# Patient Record
Sex: Male | Born: 1950 | Race: Black or African American | Hispanic: No | Marital: Married | Smoking: Never smoker
Health system: Southern US, Community
[De-identification: ages and names within clinical notes are randomized; demographics above are authoritative.]

## PROBLEM LIST (undated history)

## (undated) DIAGNOSIS — E119 Type 2 diabetes mellitus without complications: Secondary | ICD-10-CM

## (undated) DIAGNOSIS — E785 Hyperlipidemia, unspecified: Secondary | ICD-10-CM

## (undated) DIAGNOSIS — R000 Tachycardia, unspecified: Secondary | ICD-10-CM

## (undated) DIAGNOSIS — I10 Essential (primary) hypertension: Secondary | ICD-10-CM

## (undated) HISTORY — DX: Tachycardia, unspecified: R00.0

## (undated) HISTORY — DX: Essential (primary) hypertension: I10

## (undated) HISTORY — PX: OTHER SURGICAL HISTORY: SHX169

## (undated) HISTORY — DX: Hyperlipidemia, unspecified: E78.5

## (undated) HISTORY — PX: MULTIPLE TOOTH EXTRACTIONS: SHX2053

## (undated) HISTORY — DX: Type 2 diabetes mellitus without complications: E11.9

---

## 2000-06-03 ENCOUNTER — Encounter: Admission: RE | Admit: 2000-06-03 | Discharge: 2000-09-01 | Payer: Self-pay | Admitting: Family Medicine

## 2004-05-12 ENCOUNTER — Ambulatory Visit: Payer: Self-pay | Admitting: Family Medicine

## 2004-10-02 ENCOUNTER — Ambulatory Visit: Payer: Self-pay | Admitting: Family Medicine

## 2006-08-09 ENCOUNTER — Ambulatory Visit: Payer: Self-pay | Admitting: Family Medicine

## 2006-08-09 LAB — CONVERTED CEMR LAB
Albumin: 3.8 g/dL (ref 3.5–5.2)
Alkaline Phosphatase: 56 units/L (ref 39–117)
BUN: 15 mg/dL (ref 6–23)
Basophils Absolute: 0 10*3/uL (ref 0.0–0.1)
Cholesterol: 200 mg/dL (ref 0–200)
Creatinine, Ser: 1.4 mg/dL (ref 0.4–1.5)
Creatinine,U: 174 mg/dL
GFR calc Af Amer: 67 mL/min
HCT: 39.1 % (ref 39.0–52.0)
HDL: 33.7 mg/dL — ABNORMAL LOW (ref >39.0)
Hemoglobin: 13.5 g/dL (ref 13.0–17.0)
Lymphocytes Relative: 36.9 % (ref 12.0–46.0)
MCHC: 34.4 g/dL (ref 30.0–36.0)
MCV: 94.4 fL (ref 78.0–100.0)
Microalb Creat Ratio: 1.7 mg/g (ref 0.0–30.0)
Microalb, Ur: 0.3 mg/dL (ref 0.0–1.9)
Monocytes Absolute: 0.5 10*3/uL (ref 0.2–0.7)
Monocytes Relative: 10.5 % (ref 3.0–11.0)
Neutro Abs: 2.2 10*3/uL (ref 1.4–7.7)
Neutrophils Relative %: 50.1 % (ref 43.0–77.0)
PSA: 0.4 ng/mL (ref 0.10–4.00)
Potassium: 4.5 meq/L (ref 3.5–5.1)
Sodium: 139 meq/L (ref 135–145)
TSH: 0.94 microintl units/mL (ref 0.35–5.50)
Total Bilirubin: 0.8 mg/dL (ref 0.3–1.2)
Total Protein: 7.6 g/dL (ref 6.0–8.3)

## 2006-11-25 DIAGNOSIS — I10 Essential (primary) hypertension: Secondary | ICD-10-CM | POA: Insufficient documentation

## 2006-11-25 DIAGNOSIS — E119 Type 2 diabetes mellitus without complications: Secondary | ICD-10-CM | POA: Insufficient documentation

## 2007-04-07 ENCOUNTER — Telehealth: Payer: Self-pay | Admitting: Family Medicine

## 2007-05-06 ENCOUNTER — Ambulatory Visit: Payer: Self-pay | Admitting: Family Medicine

## 2007-05-06 DIAGNOSIS — R000 Tachycardia, unspecified: Secondary | ICD-10-CM

## 2007-05-06 HISTORY — DX: Tachycardia, unspecified: R00.0

## 2007-05-06 LAB — CONVERTED CEMR LAB
Chloride: 103 meq/L (ref 96–112)
Creatinine, Ser: 1.4 mg/dL (ref 0.4–1.5)
Glucose, Bld: 109 mg/dL — ABNORMAL HIGH (ref 70–99)
Hgb A1c MFr Bld: 8.8 % — ABNORMAL HIGH (ref 4.6–6.0)
Potassium: 4.8 meq/L (ref 3.5–5.1)
Sodium: 141 meq/L (ref 135–145)
TSH: 0.89 microintl units/mL (ref 0.35–5.50)

## 2007-05-12 ENCOUNTER — Telehealth: Payer: Self-pay | Admitting: Family Medicine

## 2007-06-03 ENCOUNTER — Telehealth (INDEPENDENT_AMBULATORY_CARE_PROVIDER_SITE_OTHER): Payer: Self-pay | Admitting: *Deleted

## 2007-07-18 ENCOUNTER — Telehealth: Payer: Self-pay | Admitting: Family Medicine

## 2007-08-18 ENCOUNTER — Telehealth: Payer: Self-pay | Admitting: Family Medicine

## 2007-08-29 ENCOUNTER — Telehealth (INDEPENDENT_AMBULATORY_CARE_PROVIDER_SITE_OTHER): Payer: Self-pay | Admitting: *Deleted

## 2008-01-02 ENCOUNTER — Ambulatory Visit: Payer: Self-pay | Admitting: Family Medicine

## 2008-01-02 ENCOUNTER — Telehealth: Payer: Self-pay | Admitting: Family Medicine

## 2008-03-01 ENCOUNTER — Ambulatory Visit: Payer: Self-pay | Admitting: Family Medicine

## 2008-03-02 ENCOUNTER — Telehealth: Payer: Self-pay | Admitting: *Deleted

## 2008-03-02 ENCOUNTER — Telehealth: Payer: Self-pay | Admitting: Family Medicine

## 2008-03-08 ENCOUNTER — Telehealth: Payer: Self-pay | Admitting: Family Medicine

## 2008-05-28 ENCOUNTER — Telehealth: Payer: Self-pay | Admitting: Family Medicine

## 2008-07-30 ENCOUNTER — Telehealth: Payer: Self-pay | Admitting: *Deleted

## 2009-05-16 ENCOUNTER — Telehealth: Payer: Self-pay | Admitting: Family Medicine

## 2009-06-17 ENCOUNTER — Telehealth: Payer: Self-pay | Admitting: Family Medicine

## 2009-06-21 ENCOUNTER — Ambulatory Visit: Payer: Self-pay | Admitting: Family Medicine

## 2009-06-21 LAB — CONVERTED CEMR LAB
AST: 18 units/L (ref 0–37)
Albumin: 3.9 g/dL (ref 3.5–5.2)
Alkaline Phosphatase: 48 units/L (ref 39–117)
Basophils Absolute: 0 10*3/uL (ref 0.0–0.1)
Basophils Relative: 1.1 % (ref 0.0–3.0)
Blood in Urine, dipstick: NEGATIVE
Calcium: 9.5 mg/dL (ref 8.4–10.5)
GFR calc non Af Amer: 53.07 mL/min (ref >60)
HDL: 40.7 mg/dL (ref >39.00)
Hemoglobin: 12.6 g/dL — ABNORMAL LOW (ref 13.0–17.0)
Lymphocytes Relative: 35.5 % (ref 12.0–46.0)
Microalb, Ur: 0.3 mg/dL (ref 0.0–1.9)
Monocytes Relative: 10.7 % (ref 3.0–12.0)
Neutro Abs: 2.1 10*3/uL (ref 1.4–7.7)
Nitrite: NEGATIVE
RBC: 3.87 M/uL — ABNORMAL LOW (ref 4.22–5.81)
Sodium: 144 meq/L (ref 135–145)
Total CHOL/HDL Ratio: 5
VLDL: 22.8 mg/dL (ref 0.0–40.0)
WBC Urine, dipstick: NEGATIVE
WBC: 4.3 10*3/uL — ABNORMAL LOW (ref 4.5–10.5)

## 2009-07-14 ENCOUNTER — Ambulatory Visit: Payer: Self-pay | Admitting: Family Medicine

## 2009-07-14 DIAGNOSIS — D638 Anemia in other chronic diseases classified elsewhere: Secondary | ICD-10-CM | POA: Insufficient documentation

## 2009-09-14 ENCOUNTER — Telehealth: Payer: Self-pay | Admitting: Family Medicine

## 2010-01-10 ENCOUNTER — Telehealth: Payer: Self-pay | Admitting: Family Medicine

## 2010-04-02 LAB — CONVERTED CEMR LAB
Albumin: 3.8 g/dL (ref 3.5–5.2)
Alkaline Phosphatase: 53 units/L (ref 39–117)
BUN: 22 mg/dL (ref 6–23)
Basophils Absolute: 0.1 10*3/uL (ref 0.0–0.1)
Bilirubin Urine: NEGATIVE
Blood in Urine, dipstick: NEGATIVE
Cholesterol: 202 mg/dL (ref 0–200)
Creatinine,U: 216.1 mg/dL
Direct LDL: 123.6 mg/dL
Eosinophils Absolute: 0.1 10*3/uL (ref 0.0–0.7)
Eosinophils Relative: 2.6 % (ref 0.0–5.0)
GFR calc Af Amer: 62 mL/min
GFR calc non Af Amer: 51 mL/min
HCT: 39.7 % (ref 39.0–52.0)
HDL: 37.1 mg/dL — ABNORMAL LOW (ref >39.0)
Hgb A1c MFr Bld: 8.4 % — ABNORMAL HIGH (ref 4.6–6.0)
Ketones, urine, test strip: NEGATIVE
MCHC: 33.9 g/dL (ref 30.0–36.0)
MCV: 96.6 fL (ref 78.0–100.0)
Microalb Creat Ratio: 2.3 mg/g (ref 0.0–30.0)
Monocytes Absolute: 0.4 10*3/uL (ref 0.1–1.0)
Neutrophils Relative %: 49 % (ref 43.0–77.0)
Platelets: 205 10*3/uL (ref 150–400)
Potassium: 4.9 meq/L (ref 3.5–5.1)
RDW: 12.9 % (ref 11.5–14.6)
Specific Gravity, Urine: 1.02
TSH: 0.7 microintl units/mL (ref 0.35–5.50)
Total Bilirubin: 0.8 mg/dL (ref 0.3–1.2)
Triglycerides: 133 mg/dL (ref 0–149)
WBC: 4.2 10*3/uL — ABNORMAL LOW (ref 4.5–10.5)
pH: 7
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
## 2010-04-04 NOTE — Progress Notes (Signed)
Summary: ramipril  Phone Note Refill Request Message from:  Fax from Pharmacy on January 10, 2010 5:14 PM  Refills Requested: Medication #1:  RAMIPRIL 10 MG CAPS Take two every morning--NEEDS OFFICE VISIT FOR ADDITIONAL REFILLS Initial call taken by: Kern Reap CMA Duncan Dull),  January 10, 2010 5:14 PM    Prescriptions: RAMIPRIL 10 MG CAPS (RAMIPRIL) Take two every morning--NEEDS OFFICE VISIT FOR ADDITIONAL REFILLS  #100 x 3   Entered by:   Kern Reap CMA (AAMA)   Authorized by:   Roderick Pee MD   Signed by:   Kern Reap CMA (AAMA) on 01/10/2010   Method used:   Electronically to        [facility] (retail)       [address]       [place], [place]  [address]       Ph: [phone] or [phone]       Fax: [phone]   RxID:   [id number]

## 2010-04-04 NOTE — Progress Notes (Signed)
Summary: refills  Phone Note Call from Patient Call back at Home Phone [phone] Call back at [phone]    Summary of Call: Has appointment 24th this month & needs refills meds to last until appt.  [facility].  Initial call taken by: Rudy Jew, RN,  May 16, 2009 8:31 AM    Prescriptions: GLUCOPHAGE 500 MG TABS (METFORMIN HCL) Take 1 tablet by mouth every morning  #30 x 0   Entered by:   Kern Reap CMA (AAMA)   Authorized by:   Roderick Pee MD   Signed by:   Kern Reap CMA (AAMA) on 05/16/2009   Method used:   Electronically to        [facility] (retail)       [address]       [place], [place]  [address]       Ph: [phone] or [phone]       Fax: [phone]   RxID:   [id number] RAMIPRIL 10 MG CAPS (RAMIPRIL) Take two every morning--NEEDS OFFICE VISIT FOR ADDITIONAL REFILLS  #60 x 0   Entered by:   Kern Reap CMA (AAMA)   Authorized by:   Roderick Pee MD   Signed by:   Kern Reap CMA (AAMA) on 05/16/2009   Method used:   Electronically to        [facility] (retail)       [address]       [place], [place]  [address]       Ph: [phone] or [phone]       Fax: [phone]   RxID:   [id number] TOPROL XL 100 MG XR24H-TAB (METOPROLOL SUCCINATE) Take 1 tablet by mouth every morning  #30 x 0   Entered by:   Kern Reap CMA (AAMA)   Authorized by:   Roderick Pee MD   Signed by:   Kern Reap CMA (AAMA) on 05/16/2009   Method used:   Electronically to        [facility] (retail)       [address]       [place], [place]  [address]       Ph: [phone] or [phone]       Fax: [phone]   RxID:   [id number] VIAGRA 50 MG  TABS (SILDENAFIL CITRATE) UAD  #6 x 0   Entered by:   Kern Reap CMA (AAMA)   Authorized by:   Roderick Pee MD   Signed by:   Kern Reap CMA (AAMA) on  05/16/2009   Method used:   Electronically to        [facility] (retail)       [address]       [place], [place]  [address]       Ph: [phone] or [phone]       Fax: [phone]   RxID:   [id number] GLIPIZIDE 10 MG  TABS (GLIPIZIDE) Take 1 tablet by mouth every 12 hours  #60 x 0   Entered by:   Kern Reap CMA (AAMA)   Authorized by:   Roderick Pee MD   Signed by:   Fleet Contras  Vereen CMA (AAMA) on 05/16/2009   Method used:   Electronically to        [facility] (retail)       [address]       [place], [place]  [address]       Ph: [phone] or [phone]       Fax: [phone]   RxID:   [id number] HYDROCHLOROTHIAZIDE 25 MG TABS (HYDROCHLOROTHIAZIDE) 1 tablet by mouth every morning  #30 x 0   Entered by:   Kern Reap CMA (AAMA)   Authorized by:   Roderick Pee MD   Signed by:   Kern Reap CMA (AAMA) on 05/16/2009   Method used:   Electronically to        [facility] (retail)       [address]       [place], [place]  [address]       Ph: [phone] or [phone]       Fax: [phone]   RxID:   [id number]

## 2010-04-04 NOTE — Progress Notes (Signed)
Summary: refill  Phone Note Call from Patient Call back at [phone]   Caller: Patient----live call Summary of Call: please recently all meds that were recently denied this week. he has a cpx on 06-30-2008. Initial call taken by: Warnell Forester,  June 17, 2009 1:39 PM  Follow-up for Phone Call        last office visit was 12/09 is this okay to fill? Follow-up by: Kern Reap CMA Duncan Dull),  June 17, 2009 2:04 PM  Additional Follow-up for Phone Call Additional follow up Details #1::        patient has not been here since 2009.  He makes appointments gets his medicine and then cancels the appointments Fleet Contras is advised him we will give him 30 days of medication, but absolutely no more refills Additional Follow-up by: Roderick Pee MD,  June 17, 2009 4:55 PM    Prescriptions: GLUCOPHAGE 500 MG TABS (METFORMIN HCL) Take 1 tablet by mouth every morning  #30 x 0   Entered by:   Kern Reap CMA (AAMA)   Authorized by:   Roderick Pee MD   Signed by:   Kern Reap CMA (AAMA) on 06/17/2009   Method used:   Electronically to        [facility] (retail)       [address]       [place], [place]  [address]       Ph: [phone] or [phone]       Fax: [phone]   RxID:   [id number] RAMIPRIL 10 MG CAPS (RAMIPRIL) Take two every morning--NEEDS OFFICE VISIT FOR ADDITIONAL REFILLS  #60 x 0   Entered by:   Kern Reap CMA (AAMA)   Authorized by:   Roderick Pee MD   Signed by:   Kern Reap CMA (AAMA) on 06/17/2009   Method used:   Electronically to        [facility] (retail)       [address]       [place], [place]  [address]       Ph: [phone] or [phone]       Fax: [phone]   RxID:   [id number] TOPROL XL 100 MG XR24H-TAB (METOPROLOL SUCCINATE) Take 1 tablet by mouth every morning  #30 x 0   Entered by:   Kern Reap CMA (AAMA)   Authorized by:   Roderick Pee MD  Signed by:   Kern Reap CMA (AAMA) on 06/17/2009   Method used:   Electronically to        [facility] (retail)       [address]       [place], [place]  [address]       Ph: [phone] or [phone]       Fax: [phone]   RxID:   [id number] GLIPIZIDE 10 MG  TABS (GLIPIZIDE) Take 1 tablet by mouth every 12 hours  #60 x 0   Entered by:   Kern Reap CMA (AAMA)   Authorized by:   Roderick Pee MD   Signed by:   Kern Reap CMA (AAMA) on 06/17/2009   Method used:   Electronically to        [facility] (retail)  [address]       [place], [place]  [address]       Ph: [phone] or [phone]       Fax: [phone]   RxID:   [id number] HYDROCHLOROTHIAZIDE 25 MG TABS (HYDROCHLOROTHIAZIDE) 1 tablet by mouth every morning  #30 x 0   Entered by:   Kern Reap CMA (AAMA)   Authorized by:   Roderick Pee MD   Signed by:   Kern Reap CMA (AAMA) on 06/17/2009   Method used:   Electronically to        [facility] (retail)       [address]       [place], [place]  [address]       Ph: [phone] or [phone]       Fax: [phone]   RxID:   [id number]

## 2010-04-04 NOTE — Progress Notes (Signed)
Summary: URI  Phone Note Call from Patient   Caller: Patient Call For: Roderick Pee MD Summary of Call: Pt is asking for RX for a URI.  Symptoms are coughing and sneezing with runny nose.  No fever.  [facility] [phone] Initial call taken by: Lynann Beaver CMA,  September 14, 2009 9:14 AM  Follow-up for Phone Call        symptoms consistent with allergic rhinitis......... Claritin plain, 10 mg in the morning or Zyrtec plane 10 mg at bedtime.  Both OTC products Follow-up by: Roderick Pee MD,  September 14, 2009 9:19 AM    New/Updated Medications: CLARITIN 10 MG TABS (LORATADINE) one by mouth q am ZYRTEC ALLERGY 10 MG CAPS (CETIRIZINE HCL) one by mouth q hs Prescriptions: ZYRTEC ALLERGY 10 MG CAPS (CETIRIZINE HCL) one by mouth q hs  #30 x prn   Entered by:   Lynann Beaver CMA   Authorized by:   Roderick Pee MD   Signed by:   Lynann Beaver CMA on 09/14/2009   Method used:   Electronically to        [facility] (retail)       [address]       [place], [place]  [address]       Ph: [phone] or [phone]       Fax: [phone]   RxID:   [id number] CLARITIN 10 MG TABS (LORATADINE) one by mouth q am  #30 x prn   Entered by:   Lynann Beaver CMA   Authorized by:   Roderick Pee MD   Signed by:   Lynann Beaver CMA on 09/14/2009   Method used:   Electronically to        [facility] [address] (retail)       [address]       [place], [place]  [address]       Ph: [phone] or [phone]       Fax: [phone]   RxID:   [id number]  Notified pt on personal voice mail per pt request.

## 2010-07-21 NOTE — Assessment & Plan Note (Signed)
[facility]                                 ON-CALL NOTE   Mark Ali, Mark Ali                       MRN:          [id number]  DATE:06/13/2007                            DOB:          [birth year]    Phone number [phone].  Primary physician, Dr. Tawanna Cooler.   SUBJECTIVE:  The patient has been told to increase his glipizide to  twice a day, but his prescription does not reflect this and he has run  out of it early.  He is requesting this be sent into his pharmacy.   ASSESSMENT/PLAN:  Prescription sent to [facility] [address].  Glipizide 10 mg 1 tablet p.o. b.i.d., #60, no refills.     Kerby Nora, MD  Electronically Signed    AB/MedQ  DD: 06/13/2007  DT: 06/13/2007  Job #: [id number]

## 2010-07-30 ENCOUNTER — Other Ambulatory Visit: Payer: Self-pay | Admitting: Family Medicine

## 2010-08-01 ENCOUNTER — Other Ambulatory Visit: Payer: Self-pay | Admitting: Family Medicine

## 2010-08-01 NOTE — Telephone Encounter (Signed)
Pt needs all medications refilled, appt for cpx scheduled for 10/10/10, needs enough meds until appt in August.

## 2010-08-02 NOTE — Telephone Encounter (Signed)
Pt would like 3 refills of each med. Pt is sch for cpx in aug 2012

## 2010-08-09 ENCOUNTER — Other Ambulatory Visit: Payer: Self-pay | Admitting: Family Medicine

## 2010-09-08 ENCOUNTER — Other Ambulatory Visit: Payer: Self-pay | Admitting: *Deleted

## 2010-09-08 MED ORDER — METFORMIN HCL 500 MG PO TABS: 500.0000 mg | ORAL_TABLET | Freq: Two times a day (BID) | ORAL | Status: DC

## 2010-10-03 ENCOUNTER — Other Ambulatory Visit: Payer: Self-pay

## 2010-10-10 ENCOUNTER — Encounter: Payer: Self-pay | Admitting: Family Medicine

## 2010-10-12 ENCOUNTER — Other Ambulatory Visit: Payer: Self-pay

## 2010-10-26 ENCOUNTER — Other Ambulatory Visit: Payer: Self-pay

## 2010-11-02 ENCOUNTER — Encounter: Payer: Self-pay | Admitting: Family Medicine

## 2010-11-21 ENCOUNTER — Other Ambulatory Visit: Payer: Self-pay

## 2010-11-28 ENCOUNTER — Encounter: Payer: Self-pay | Admitting: Family Medicine

## 2010-12-24 ENCOUNTER — Other Ambulatory Visit: Payer: Self-pay | Admitting: Family Medicine

## 2010-12-26 ENCOUNTER — Other Ambulatory Visit: Payer: Self-pay | Admitting: Family Medicine

## 2011-01-29 ENCOUNTER — Other Ambulatory Visit: Payer: Self-pay | Admitting: Family Medicine

## 2011-02-14 ENCOUNTER — Other Ambulatory Visit: Payer: Self-pay | Admitting: Family Medicine

## 2011-02-20 ENCOUNTER — Other Ambulatory Visit (INDEPENDENT_AMBULATORY_CARE_PROVIDER_SITE_OTHER): Payer: BC Managed Care – PPO

## 2011-02-20 DIAGNOSIS — Z0000 Encounter for general adult medical examination without abnormal findings: Secondary | ICD-10-CM

## 2011-02-20 LAB — LIPID PANEL
Cholesterol: 183 mg/dL (ref 0–200)
LDL Cholesterol: 120 mg/dL — ABNORMAL HIGH (ref 0–99)
Total CHOL/HDL Ratio: 5
VLDL: 26.4 mg/dL (ref 0.0–40.0)

## 2011-02-20 LAB — CBC WITH DIFFERENTIAL/PLATELET
Basophils Absolute: 0.1 10*3/uL (ref 0.0–0.1)
Hemoglobin: 12.9 g/dL — ABNORMAL LOW (ref 13.0–17.0)
Lymphocytes Relative: 31.7 % (ref 12.0–46.0)
Monocytes Relative: 9.7 % (ref 3.0–12.0)
Neutro Abs: 2.4 10*3/uL (ref 1.4–7.7)
RDW: 14 % (ref 11.5–14.6)
WBC: 4.4 10*3/uL — ABNORMAL LOW (ref 4.5–10.5)

## 2011-02-20 LAB — HEPATIC FUNCTION PANEL
ALT: 15 U/L (ref 0–53)
AST: 19 U/L (ref 0–37)
Alkaline Phosphatase: 65 U/L (ref 39–117)
Bilirubin, Direct: 0.1 mg/dL (ref 0.0–0.3)
Total Bilirubin: 0.5 mg/dL (ref 0.3–1.2)
Total Protein: 7.1 g/dL (ref 6.0–8.3)

## 2011-02-20 LAB — BASIC METABOLIC PANEL
Calcium: 9.7 mg/dL (ref 8.4–10.5)
GFR: 57.85 mL/min — ABNORMAL LOW (ref >60.00)
Glucose, Bld: 159 mg/dL — ABNORMAL HIGH (ref 70–99)
Sodium: 143 mEq/L (ref 135–145)

## 2011-02-20 LAB — TSH: TSH: 1.11 u[IU]/mL (ref 0.35–5.50)

## 2011-02-20 LAB — POCT URINALYSIS DIPSTICK
Blood, UA: NEGATIVE
Glucose, UA: NEGATIVE
Spec Grav, UA: >=1.03
Urobilinogen, UA: 0.2
pH, UA: 6

## 2011-02-21 LAB — MICROALBUMIN / CREATININE URINE RATIO: Creatinine,U: 316.8 mg/dL

## 2011-02-23 ENCOUNTER — Other Ambulatory Visit: Payer: Self-pay | Admitting: Family Medicine

## 2011-03-02 ENCOUNTER — Encounter: Payer: Self-pay | Admitting: Family Medicine

## 2011-03-05 ENCOUNTER — Encounter: Payer: Self-pay | Admitting: Family Medicine

## 2011-03-05 ENCOUNTER — Telehealth: Payer: Self-pay | Admitting: Family Medicine

## 2011-03-05 ENCOUNTER — Ambulatory Visit (INDEPENDENT_AMBULATORY_CARE_PROVIDER_SITE_OTHER): Payer: BC Managed Care – PPO | Admitting: Family Medicine

## 2011-03-05 DIAGNOSIS — D638 Anemia in other chronic diseases classified elsewhere: Secondary | ICD-10-CM

## 2011-03-05 DIAGNOSIS — I10 Essential (primary) hypertension: Secondary | ICD-10-CM

## 2011-03-05 DIAGNOSIS — Z23 Encounter for immunization: Secondary | ICD-10-CM

## 2011-03-05 DIAGNOSIS — R000 Tachycardia, unspecified: Secondary | ICD-10-CM

## 2011-03-05 DIAGNOSIS — E119 Type 2 diabetes mellitus without complications: Secondary | ICD-10-CM

## 2011-03-05 MED ORDER — RAMIPRIL 10 MG PO CAPS: ORAL_CAPSULE | ORAL | Status: DC

## 2011-03-05 MED ORDER — GLIPIZIDE 10 MG PO TABS: 10.0000 mg | ORAL_TABLET | Freq: Two times a day (BID) | ORAL | Status: DC

## 2011-03-05 MED ORDER — SIMVASTATIN 10 MG PO TABS: 10.0000 mg | ORAL_TABLET | Freq: Every day | ORAL | Status: DC

## 2011-03-05 MED ORDER — HYDROCHLOROTHIAZIDE 25 MG PO TABS: 25.0000 mg | ORAL_TABLET | Freq: Every day | ORAL | Status: DC

## 2011-03-05 MED ORDER — METFORMIN HCL 500 MG PO TABS
500.0000 mg | ORAL_TABLET | Freq: Two times a day (BID) | ORAL | Status: DC
Start: 2011-03-05 — End: 2012-05-19

## 2011-03-05 MED ORDER — METOPROLOL SUCCINATE ER 100 MG PO TB24: 100.0000 mg | ORAL_TABLET | Freq: Every day | ORAL | Status: DC

## 2011-03-05 MED ORDER — SILDENAFIL CITRATE 50 MG PO TABS: 50.0000 mg | ORAL_TABLET | ORAL | Status: DC | PRN

## 2011-03-05 NOTE — Patient Instructions (Signed)
Stop the aspirin and take an iron tablet daily at bedtime for 3 months.  In 3 months.  We will recheck your blood count.  Begin Zocor 10 mg dose one tablet daily at bedtime to lower your cholesterol.  Continue your diabetes medicine diet and exercise.  Follow-up A1c in 3 months.  We will also do follow-up cholesterol labs in 3 months.

## 2011-03-05 NOTE — Telephone Encounter (Signed)
Opened in error

## 2011-03-05 NOTE — Progress Notes (Signed)
  Subjective:    Patient ID: Mark Ali, male    DOB: June 04, 1950, 60 y.o.   MRN: [id number]  HPI Mark Ali is a 60 year old, married male, nonsmoker, who comes in today for annual physical hydration because of underlying diabetes, type 2, hypertension, and hyperlipidemia, and erectile dysfunction.  He states his blood sugars in the 110, 120 range, and, indeed,  his A1c down to 6.9% from 8.1% a year ago.  He has worked diligently to do this.  His blood pressure is 130/90 on Altace 20 mg daily and Toprol 100 mg daily and had a chlorothiazide 25 mg daily.  Now that his blood sugar has stabilized.  His lipids show an LDL of 120.  We discussed that getting them below 75.  Will add low-dose statin.  Also again his hemoglobin is dropped to 12.9.  He takes an aspirin tablet daily.  Will stop the aspirin and give him iron for 3 months and recheck a CBC when we recheck his A1c in 3 months   Review of Systems  Constitutional: Negative.   HENT: Negative.   Eyes: Negative.   Respiratory: Negative.   Cardiovascular: Negative.   Gastrointestinal: Negative.   Genitourinary: Negative.   Musculoskeletal: Negative.   Skin: Negative.   Neurological: Negative.   Hematological: Negative.   Psychiatric/Behavioral: Negative.        Objective:   Physical Exam  Constitutional: He is oriented to person, place, and time. He appears well-developed and well-nourished.  HENT:  Head: Normocephalic and atraumatic.  Right Ear: External ear normal.  Left Ear: External ear normal.  Nose: Nose normal.  Mouth/Throat: Oropharynx is clear and moist.  Eyes: Conjunctivae and EOM are normal. Pupils are equal, round, and reactive to light.  Neck: Normal range of motion. Neck supple. No JVD present. No tracheal deviation present. No thyromegaly present.  Cardiovascular: Normal rate, regular rhythm, normal heart sounds and intact distal pulses.  Exam reveals no gallop and no friction rub.   No murmur  heard. Pulmonary/Chest: Effort normal and breath sounds normal. No stridor. No respiratory distress. He has no wheezes. He has no rales. He exhibits no tenderness.  Abdominal: Soft. Bowel sounds are normal. He exhibits no distension and no mass. There is no tenderness. There is no rebound and no guarding.  Genitourinary: Rectum normal, prostate normal and penis normal. Guaiac negative stool. No penile tenderness.  Musculoskeletal: Normal range of motion. He exhibits no edema and no tenderness.  Lymphadenopathy:    He has no cervical adenopathy.  Neurological: He is alert and oriented to person, place, and time. He has normal reflexes. No cranial nerve deficit. He exhibits normal muscle tone.  Skin: Skin is warm and dry. No rash noted. No erythema. No pallor.  Psychiatric: He has a normal mood and affect. His behavior is normal. Judgment and thought content normal.          Assessment & Plan:  Healthy man.  Diabetes type II improve control with compliance was done in exercise.  Hypertension.  It goal continue current therapy.  There is no dysfunction.  Continue Viagra.  Anemia.  Stop aspirin take one iron tablet daily CBC in 3 months.  Hyperlipidemia at 10 mg a Zocor nightly follow-up lipid and liver level in 3 months

## 2011-06-27 ENCOUNTER — Other Ambulatory Visit: Payer: Self-pay | Admitting: *Deleted

## 2011-06-27 DIAGNOSIS — I10 Essential (primary) hypertension: Secondary | ICD-10-CM

## 2011-06-27 MED ORDER — HYDROCHLOROTHIAZIDE 25 MG PO TABS: 25.0000 mg | ORAL_TABLET | Freq: Every day | ORAL | Status: DC

## 2012-01-11 ENCOUNTER — Telehealth: Payer: Self-pay | Admitting: Family Medicine
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
## 2012-01-14 NOTE — Telephone Encounter (Signed)
Left detailed message on machine for patient 

## 2012-01-14 NOTE — Telephone Encounter (Signed)
Dr. Norlene Campbell ,,,,,,,,,,,,,,,, he can call and make his own appointment

## 2012-03-07 ENCOUNTER — Other Ambulatory Visit: Payer: Self-pay | Admitting: Family Medicine

## 2012-03-21 ENCOUNTER — Other Ambulatory Visit: Payer: Self-pay | Admitting: Family Medicine

## 2012-05-16 ENCOUNTER — Telehealth: Payer: Self-pay | Admitting: Family Medicine

## 2012-05-16 DIAGNOSIS — I10 Essential (primary) hypertension: Secondary | ICD-10-CM

## 2012-05-16 NOTE — Telephone Encounter (Signed)
Patient would like to have all of his meds refilled as he can not get a cpx appt until May. Please assist and send all med refills to [facility].

## 2012-05-19 ENCOUNTER — Other Ambulatory Visit: Payer: Self-pay | Admitting: Family Medicine

## 2012-05-19 MED ORDER — GLIPIZIDE 10 MG PO TABS: 10.0000 mg | ORAL_TABLET | Freq: Two times a day (BID) | ORAL | Status: DC

## 2012-05-19 MED ORDER — METFORMIN HCL 500 MG PO TABS: ORAL_TABLET | ORAL | Status: DC

## 2012-05-19 MED ORDER — METFORMIN HCL 500 MG PO TABS: 500.0000 mg | ORAL_TABLET | Freq: Two times a day (BID) | ORAL | Status: DC

## 2012-05-19 MED ORDER — RAMIPRIL 10 MG PO CAPS: ORAL_CAPSULE | ORAL | Status: DC

## 2012-05-19 MED ORDER — METOPROLOL SUCCINATE ER 100 MG PO TB24: ORAL_TABLET | ORAL | Status: DC

## 2012-05-19 MED ORDER — SILDENAFIL CITRATE 50 MG PO TABS: ORAL_TABLET | ORAL | Status: DC

## 2012-05-19 MED ORDER — HYDROCHLOROTHIAZIDE 25 MG PO TABS: 25.0000 mg | ORAL_TABLET | Freq: Every day | ORAL | Status: DC

## 2012-05-19 NOTE — Telephone Encounter (Signed)
Rx sent 

## 2012-07-05 ENCOUNTER — Other Ambulatory Visit: Payer: Self-pay | Admitting: Family Medicine

## 2012-07-09 MED ORDER — SILDENAFIL CITRATE 50 MG PO TABS: ORAL_TABLET | ORAL | Status: DC

## 2012-07-09 NOTE — Addendum Note (Signed)
Addended by: Kern Reap B on: 07/09/2012 09:28 AM   Modules accepted: Orders

## 2012-07-09 NOTE — Telephone Encounter (Signed)
PT calling to inquire about his viagra and wants to know why is was denied. He would like this RX prior to his next appt. Please assist,.

## 2012-07-09 NOTE — Telephone Encounter (Signed)
Pt has an appt on 07/17/12, and would like an RX of Viagra to last him until then. Please assist.

## 2012-07-10 ENCOUNTER — Other Ambulatory Visit: Payer: BC Managed Care – PPO

## 2012-07-13 ENCOUNTER — Other Ambulatory Visit: Payer: Self-pay | Admitting: Family Medicine

## 2012-07-17 ENCOUNTER — Encounter: Payer: BC Managed Care – PPO | Admitting: Family Medicine

## 2012-08-11 ENCOUNTER — Telehealth: Payer: Self-pay | Admitting: Family Medicine

## 2012-08-11 DIAGNOSIS — E119 Type 2 diabetes mellitus without complications: Secondary | ICD-10-CM

## 2012-08-11 MED ORDER — SIMVASTATIN 10 MG PO TABS: 10.0000 mg | ORAL_TABLET | Freq: Every day | ORAL | Status: DC

## 2012-08-11 MED ORDER — SILDENAFIL CITRATE 50 MG PO TABS: ORAL_TABLET | ORAL | Status: DC

## 2012-08-11 NOTE — Telephone Encounter (Signed)
Pt needs refills on simvastatin 10 mg and viagra 50 mg call into [facility] [phone]. Pt has appt in July 2014

## 2012-09-12 ENCOUNTER — Other Ambulatory Visit: Payer: Self-pay | Admitting: Family Medicine

## 2012-09-12 NOTE — Telephone Encounter (Signed)
Pt needs refill on viagra 50 mg #6 call into [facility]

## 2012-09-13 ENCOUNTER — Other Ambulatory Visit: Payer: Self-pay | Admitting: Family Medicine

## 2012-09-16 ENCOUNTER — Other Ambulatory Visit (INDEPENDENT_AMBULATORY_CARE_PROVIDER_SITE_OTHER): Payer: BC Managed Care – PPO

## 2012-09-16 DIAGNOSIS — Z0000 Encounter for general adult medical examination without abnormal findings: Secondary | ICD-10-CM

## 2012-09-16 LAB — POCT URINALYSIS DIPSTICK
Bilirubin, UA: NEGATIVE
Blood, UA: NEGATIVE
Leukocytes, UA: NEGATIVE
Nitrite, UA: NEGATIVE
Urobilinogen, UA: 0.2

## 2012-09-16 LAB — HEPATIC FUNCTION PANEL
Alkaline Phosphatase: 58 U/L (ref 39–117)
Bilirubin, Direct: 0.1 mg/dL (ref 0.0–0.3)
Total Bilirubin: 0.6 mg/dL (ref 0.3–1.2)
Total Protein: 7.1 g/dL (ref 6.0–8.3)

## 2012-09-16 LAB — MICROALBUMIN / CREATININE URINE RATIO
Creatinine,U: 183.7 mg/dL
Microalb Creat Ratio: 0.4 mg/g (ref 0.0–30.0)
Microalb, Ur: 0.8 mg/dL (ref 0.0–1.9)

## 2012-09-16 LAB — LIPID PANEL
HDL: 33.3 mg/dL — ABNORMAL LOW (ref >39.00)
Total CHOL/HDL Ratio: 4

## 2012-09-16 LAB — CBC WITH DIFFERENTIAL/PLATELET
Basophils Relative: 1.1 % (ref 0.0–3.0)
Eosinophils Absolute: 0.2 10*3/uL (ref 0.0–0.7)
Eosinophils Relative: 3.7 % (ref 0.0–5.0)
Lymphocytes Relative: 33.4 % (ref 12.0–46.0)
MCHC: 33.7 g/dL (ref 30.0–36.0)
Neutrophils Relative %: 51.6 % (ref 43.0–77.0)
Platelets: 207 10*3/uL (ref 150.0–400.0)
RBC: 3.85 Mil/uL — ABNORMAL LOW (ref 4.22–5.81)
WBC: 4.3 10*3/uL — ABNORMAL LOW (ref 4.5–10.5)

## 2012-09-16 LAB — BASIC METABOLIC PANEL
BUN: 24 mg/dL — ABNORMAL HIGH (ref 6–23)
Calcium: 9.3 mg/dL (ref 8.4–10.5)
Creatinine, Ser: 1.6 mg/dL — ABNORMAL HIGH (ref 0.4–1.5)

## 2012-09-16 LAB — PSA: PSA: 0.5 ng/mL (ref 0.10–4.00)

## 2012-09-16 LAB — HEMOGLOBIN A1C: Hgb A1c MFr Bld: 7.5 % — ABNORMAL HIGH (ref 4.6–6.5)

## 2012-09-17 ENCOUNTER — Other Ambulatory Visit: Payer: BC Managed Care – PPO

## 2012-09-25 ENCOUNTER — Encounter: Payer: BC Managed Care – PPO | Admitting: Family Medicine

## 2012-09-29 ENCOUNTER — Encounter: Payer: BC Managed Care – PPO | Admitting: Family

## 2012-10-03 ENCOUNTER — Encounter: Payer: BC Managed Care – PPO | Admitting: Family

## 2012-10-07 ENCOUNTER — Encounter: Payer: BC Managed Care – PPO | Admitting: Family

## 2012-10-10 ENCOUNTER — Other Ambulatory Visit: Payer: Self-pay | Admitting: Family Medicine

## 2012-10-11 ENCOUNTER — Other Ambulatory Visit: Payer: Self-pay | Admitting: Family Medicine

## 2012-10-14 ENCOUNTER — Other Ambulatory Visit: Payer: Self-pay | Admitting: Family Medicine

## 2012-10-14 ENCOUNTER — Other Ambulatory Visit: Payer: Self-pay | Admitting: *Deleted

## 2012-10-14 MED ORDER — GLIPIZIDE 10 MG PO TABS: 10.0000 mg | ORAL_TABLET | Freq: Two times a day (BID) | ORAL | Status: DC

## 2012-10-14 MED ORDER — RAMIPRIL 10 MG PO CAPS: ORAL_CAPSULE | ORAL | Status: DC

## 2012-10-15 ENCOUNTER — Encounter: Payer: Self-pay | Admitting: Family

## 2012-10-15 ENCOUNTER — Ambulatory Visit (INDEPENDENT_AMBULATORY_CARE_PROVIDER_SITE_OTHER): Payer: BC Managed Care – PPO | Admitting: Family

## 2012-10-15 VITALS — BP 160/80 | HR 71 | Wt 254.0 lb

## 2012-10-15 DIAGNOSIS — Z0000 Encounter for general adult medical examination without abnormal findings: Secondary | ICD-10-CM

## 2012-10-15 DIAGNOSIS — E119 Type 2 diabetes mellitus without complications: Secondary | ICD-10-CM

## 2012-10-15 DIAGNOSIS — I10 Essential (primary) hypertension: Secondary | ICD-10-CM

## 2012-10-15 MED ORDER — GLIPIZIDE 10 MG PO TABS: 10.0000 mg | ORAL_TABLET | Freq: Two times a day (BID) | ORAL | Status: DC

## 2012-10-15 MED ORDER — SILDENAFIL CITRATE 50 MG PO TABS: ORAL_TABLET | ORAL | Status: DC

## 2012-10-15 MED ORDER — HYDROCHLOROTHIAZIDE 25 MG PO TABS: ORAL_TABLET | ORAL | Status: DC

## 2012-10-15 MED ORDER — SIMVASTATIN 10 MG PO TABS: 10.0000 mg | ORAL_TABLET | Freq: Every day | ORAL | Status: DC

## 2012-10-15 MED ORDER — METFORMIN HCL 500 MG PO TABS: 500.0000 mg | ORAL_TABLET | Freq: Two times a day (BID) | ORAL | Status: DC

## 2012-10-15 MED ORDER — RAMIPRIL 10 MG PO CAPS: ORAL_CAPSULE | ORAL | Status: DC

## 2012-10-15 MED ORDER — METOPROLOL SUCCINATE ER 100 MG PO TB24: ORAL_TABLET | ORAL | Status: DC

## 2012-10-15 NOTE — Progress Notes (Signed)
  Subjective:    Patient ID: Mark Ali, male    DOB: 1950/10/16, 62 y.o.   MRN: [id number]  HPI  Patient presents for yearly preventative medicine examination. All immunizations and health maintenance protocols were reviewed with the patient and they are up to date with these protocols. Screening laboratory values were reviewed with the patient including screening of hyperlipidemia PSA renal function and hepatic function. There medications past medical history social history problem list and allergies were reviewed in detail. Goals were established with regard to weight loss exercise diet in compliance with medications   Review of Systems  Constitutional: Negative.   Eyes: Negative.   Respiratory: Negative.   Cardiovascular: Negative.   Gastrointestinal: Negative.   Endocrine: Negative.   Genitourinary: Negative.   Musculoskeletal: Negative.   Skin: Negative.   Allergic/Immunologic: Negative.   Neurological: Negative.   Hematological: Negative.   Psychiatric/Behavioral: Negative.    History reviewed. No pertinent past medical history.  History   Social History  . Marital Status: Married    Spouse Name: N/A    Number of Children: N/A  . Years of Education: N/A   Occupational History  . Not on file.   Social History Main Topics  . Smoking status: Never Smoker   . Smokeless tobacco: Not on file  . Alcohol Use: No  . Drug Use: No  . Sexual Activity:    Other Topics Concern  . Not on file   Social History Narrative  . No narrative on file    Past Surgical History  Procedure Laterality Date  . Multiple tooth extractions      Family History  Problem Relation Age of Onset  . Diabetes Other   . Kidney disease Other     No Known Allergies  No current outpatient prescriptions on file prior to visit.   No current facility-administered medications on file prior to visit.    BP 160/80  Pulse 71  Wt 254 lb (115.214 kg)  BMI 32.39 kg/m2  SpO2  98%chart    Objective:   Physical Exam  Constitutional: He is oriented to person, place, and time. He appears well-developed and well-nourished.  HENT:  Head: Normocephalic.  Right Ear: External ear normal.  Left Ear: External ear normal.  Nose: Nose normal.  Mouth/Throat: Oropharynx is clear and moist.  Eyes: Conjunctivae and EOM are normal. Pupils are equal, round, and reactive to light.  Neck: Normal range of motion. Neck supple. No thyromegaly present.  Cardiovascular: Normal rate, regular rhythm and normal heart sounds.   Pulmonary/Chest: Effort normal and breath sounds normal.  Abdominal: Soft. Bowel sounds are normal.  Musculoskeletal: Normal range of motion.  Neurological: He is alert and oriented to person, place, and time. He has normal reflexes. No cranial nerve deficit. Coordination normal.  Skin: Skin is warm and dry.  Psychiatric: He has a normal mood and affect.          Assessment & Plan:  Assessment:  1. CPX 2. Type 2 DM 3. Hypercholesterolemia 4. HTN  Plan: Recheck blood pressure in one month. Consider adjusting medication at that time. Patient relates his blood pressure is elevated today due to stress. Low-sodium diet. Decrease intake of fatty foods, white breads and pasta. A1c 7.5 we will try lifestyle modifications rather than increase medication at this time. Patient agrees to exercise.

## 2012-10-15 NOTE — Patient Instructions (Addendum)
Diabetes Meal Planning Guide The diabetes meal planning guide is a tool to help you plan your meals and snacks. It is important for people with diabetes to manage their blood glucose (sugar) levels. Choosing the right foods and the right amounts throughout your day will help control your blood glucose. Eating right can even help you improve your blood pressure and reach or maintain a healthy weight. CARBOHYDRATE COUNTING MADE EASY When you eat carbohydrates, they turn to sugar. This raises your blood glucose level. Counting carbohydrates can help you control this level so you feel better. When you plan your meals by counting carbohydrates, you can have more flexibility in what you eat and balance your medicine with your food intake. Carbohydrate counting simply means adding up the total amount of carbohydrate grams in your meals and snacks. Try to eat about the same amount at each meal. Foods with carbohydrates are listed below. Each portion below is 1 carbohydrate serving or 15 grams of carbohydrates. Ask your dietician how many grams of carbohydrates you should eat at each meal or snack. Grains and Starches  1 slice bread.   English muffin or hotdog/hamburger bun.   cup cold cereal (unsweetened).   cup cooked pasta or rice.   cup starchy vegetables (corn, potatoes, peas, beans, winter squash).  1 tortilla (6 inches).   bagel.  1 waffle or pancake (size of a CD).   cup cooked cereal.  4 to 6 small crackers. *Whole grain is recommended. Fruit  1 cup fresh unsweetened berries, melon, papaya, pineapple.  1 small fresh fruit.   banana or mango.   cup fruit juice (4 oz unsweetened).   cup canned fruit in natural juice or water.  2 tbs dried fruit.  12 to 15 grapes or cherries. Milk and Yogurt  1 cup fat-free or 1% milk.  1 cup soy milk.  6 oz light yogurt with sugar-free sweetener.  6 oz low-fat soy yogurt.  6 oz plain yogurt. Vegetables  1 cup raw or  cup  cooked is counted as 0 carbohydrates or a "free" food.  If you eat 3 or more servings at 1 meal, count them as 1 carbohydrate serving. Other Carbohydrates   oz chips or pretzels.   cup ice cream or frozen yogurt.   cup sherbet or sorbet.  2 inch square cake, no frosting.  1 tbs honey, sugar, jam, jelly, or syrup.  2 small cookies.  3 squares of graham crackers.  3 cups popcorn.  6 crackers.  1 cup broth-based soup.  Count 1 cup casserole or other mixed foods as 2 carbohydrate servings.  Foods with less than 20 calories in a serving may be counted as 0 carbohydrates or a "free" food. You may want to purchase a book or computer software that lists the carbohydrate gram counts of different foods. In addition, the nutrition facts panel on the labels of the foods you eat are a good source of this information. The label will tell you how big the serving size is and the total number of carbohydrate grams you will be eating per serving. Divide this number by 15 to obtain the number of carbohydrate servings in a portion. Remember, 1 carbohydrate serving equals 15 grams of carbohydrate. SERVING SIZES Measuring foods and serving sizes helps you make sure you are getting the right amount of food. The list below tells how big or small some common serving sizes are.  1 oz.........4 stacked dice.  3 oz.........Deck of cards.  1 tsp........Tip   of little finger.  1 tbs......Marland KitchenMarland KitchenThumb.  2 tbs.......Marland KitchenGolf ball.   cup......Marland KitchenHalf of a fist.  1 cup.......Marland KitchenA fist. SAMPLE DIABETES MEAL PLAN Below is a sample meal plan that includes foods from the grain and starches, dairy, vegetable, fruit, and meat groups. A dietician can individualize a meal plan to fit your calorie needs and tell you the number of servings needed from each food group. However, controlling the total amount of carbohydrates in your meal or snack is more important than making sure you include all of the food groups at every  meal. You may interchange carbohydrate containing foods (dairy, starches, and fruits). The meal plan below is an example of a 2000 calorie diet using carbohydrate counting. This meal plan has 17 carbohydrate servings. Breakfast  1 cup oatmeal (2 carb servings).   cup light yogurt (1 carb serving).  1 cup blueberries (1 carb serving).   cup almonds. Snack  1 large apple (2 carb servings).  1 low-fat string cheese stick. Lunch  Chicken breast salad.  1 cup spinach.   cup chopped tomatoes.  2 oz chicken breast, sliced.  2 tbs low-fat [place] dressing.  12 whole-wheat crackers (2 carb servings).  12 to 15 grapes (1 carb serving).  1 cup low-fat milk (1 carb serving). Snack  1 cup carrots.   cup hummus (1 carb serving). Dinner  3 oz broiled salmon.  1 cup brown rice (3 carb servings). Snack  1  cups steamed broccoli (1 carb serving) drizzled with 1 tsp olive oil and lemon juice.  1 cup light pudding (2 carb servings). DIABETES MEAL PLANNING WORKSHEET Your dietician can use this worksheet to help you decide how many servings of foods and what types of foods are right for you.  BREAKFAST Food Group and Servings / Carb Servings Grain/Starches __________________________________ Dairy __________________________________________ Vegetable ______________________________________ Fruit ___________________________________________ Meat __________________________________________ Fat ____________________________________________ LUNCH Food Group and Servings / Carb Servings Grain/Starches ___________________________________ Dairy ___________________________________________ Fruit ____________________________________________ Meat ___________________________________________ Fat _____________________________________________ Laural Golden Food Group and Servings / Carb Servings Grain/Starches ___________________________________ Dairy  ___________________________________________ Fruit ____________________________________________ Meat ___________________________________________ Fat _____________________________________________ SNACKS Food Group and Servings / Carb Servings Grain/Starches ___________________________________ Dairy ___________________________________________ Vegetable _______________________________________ Fruit ____________________________________________ Meat ___________________________________________ Fat _____________________________________________ DAILY TOTALS Starches _________________________ Vegetable ________________________ Fruit ____________________________ Dairy ____________________________ Meat ____________________________ Fat ______________________________ Document Released: 11/16/2004 Document Revised: 05/14/2011 Document Reviewed: 09/27/2008 [employer] Patient Information 2014 [employer].    Diabetes and Exercise Regular exercise is important and can help:   Control blood glucose (sugar).  Decrease blood pressure.    Control blood lipids (cholesterol, triglycerides).  Improve overall health. BENEFITS FROM EXERCISE  Improved fitness.  Improved flexibility.  Improved endurance.  Increased bone density.  Weight control.  Increased muscle strength.  Decreased body fat.  Improvement of the body's use of insulin, a hormone.  Increased insulin sensitivity.  Reduction of insulin needs.  Reduced stress and tension.  Helps you feel better. People with diabetes who add exercise to their lifestyle gain additional benefits, including:  Weight loss.  Reduced appetite.  Improvement of the body's use of blood glucose.  Decreased risk factors for heart disease:  Lowering of cholesterol and triglycerides.  Raising the level of good cholesterol (high-density lipoproteins, HDL).  Lowering blood sugar.  Decreased blood pressure. TYPE 1 DIABETES AND  EXERCISE  Exercise will usually lower your blood glucose.  If blood glucose is greater than 240 mg/dl, check urine ketones. If ketones are present, do not exercise.  Location of the insulin injection sites may need to  be adjusted with exercise. Avoid injecting insulin into areas of the body that will be exercised. For example, avoid injecting insulin into:  The arms when playing tennis.  The legs when jogging. For more information, discuss this with your caregiver.  Keep a record of:  Food intake.  Type and amount of exercise.  Expected peak times of insulin action.  Blood glucose levels. Do this before, during, and after exercise. Review your records with your caregiver. This will help you to develop guidelines for adjusting food intake and insulin amounts.  TYPE 2 DIABETES AND EXERCISE  Regular physical activity can help control blood glucose.  Exercise is important because it may:  Increase the body's sensitivity to insulin.  Improve blood glucose control.  Exercise reduces the risk of heart disease. It decreases serum cholesterol and triglycerides. It also lowers blood pressure.  Those who take insulin or oral hypoglycemic agents should watch for signs of hypoglycemia. These signs include dizziness, shaking, sweating, chills, and confusion.  Body water is lost during exercise. It must be replaced. This will help to avoid loss of body fluids (dehydration) or heat stroke. Be sure to talk to your caregiver before starting an exercise program to make sure it is safe for you. Remember, any activity is better than none.  Document Released: 05/12/2003 Document Revised: 05/14/2011 Document Reviewed: 08/26/2008 [facility] Patient Information 2014 [place], [place].

## 2013-04-04 ENCOUNTER — Other Ambulatory Visit: Payer: Self-pay | Admitting: Family

## 2013-04-04 ENCOUNTER — Other Ambulatory Visit: Payer: Self-pay | Admitting: Family Medicine

## 2013-04-15 ENCOUNTER — Telehealth: Payer: Self-pay | Admitting: Family Medicine

## 2013-04-15 NOTE — Telephone Encounter (Signed)
Patient Information:  Caller Name: Leonette MostCharles  Phone: [phone]  Patient: Mark Ali, Mark Ali  Gender: Male  DOB: 07/09/1950  Age: [age] Years  PCP: Kelle Dartingodd, Jeffrey [facility](Family Practice)  Office Follow Up:  Does the office need to follow up with this patient?: No  Instructions For The Office: N/A   Symptoms  Reason For Call & Symptoms: Calling about starting with cold and congestion onset 04/10/12. He has occasional loose cough with clear sputum and has yellow nasal drainage. Afebrile. No other sx.  Reviewed Health History In EMR: Yes  Reviewed Medications In EMR: Yes  Reviewed Allergies In EMR: Yes  Reviewed Surgeries / Procedures: Yes  Date of Onset of Symptoms: 04/10/2013  Treatments Tried: OTC cough med with Sudafed- took 1 tsp one time  Treatments Tried Worked: No  Guideline(s) Used:  Cough  Disposition Per Guideline:   Home Care  Reason For Disposition Reached:   Cough with no complications  Advice Given:  OTC Cough Syrup - Dextromethorphan:  Cough syrups containing the cough suppressant dextromethorphan (DM) may help decrease your cough. Cough syrups work best for coughs that keep you awake at night. They can also sometimes help in the late stages of a respiratory infection when the cough is dry and hacking. They can be used along with cough drops.  Reassurance  Coughing is the way that our lungs remove irritants and mucus. It helps protect our lungs from getting pneumonia.  You can get a dry hacking cough after a chest cold. Sometimes this type of cough can last 1-3 weeks, and be worse at night.  Here is some care advice that should help.  OTC Cough Syrup - Dextromethorphan:  Cough syrups containing the cough suppressant dextromethorphan (DM) may help decrease your cough. Cough syrups work best for coughs that keep you awake at night. They can also sometimes help in the late stages of a respiratory infection when the cough is dry and hacking. They can be used along with cough  drops.  Examples: Benylin, Robitussin DM, Vicks 44 Cough Relief  Caution - Dextromethorphan:   Do not try to completely suppress coughs that produce mucus and phlegm. Remember that coughing is helpful in bringing up mucus from the lungs and preventing pneumonia.  Research Notes: Dextromethorphan in some research studies has been shown to reduce the frequency and severity of cough in adults (18 years or older) without significant adverse effects. However, other studies suggest that dextromethorphan is no better than placebo at reducing a cough.  Coughing Spasms:  Drink warm fluids. Inhale warm mist (Reason: both relax the airway and loosen up the phlegm).  Suck on cough drops or hard candy to coat the irritated throat.  Prevent Dehydration:  Drink adequate liquids.  This will help soothe an irritated or dry throat and loosen up the phlegm.  Expected Course:   The expected course depends on what is causing the cough.  Viral bronchitis (chest cold) causes a cough that lasts 1 to 3 weeks. Sometimes you may cough up lots of phlegm (sputum, mucus). The mucus can normally be white, gray, yellow, or green.  Call Back If:  Difficulty breathing  Cough lasts more than 3 weeks  Fever lasts > 3 days  You become worse.  Patient Will Follow Care Advice: Advised to avoid Sudafed and cough medications containing Sudafed or added sugar. Suggested Corididin HB and sugar free cough drops

## 2013-04-15 NOTE — Telephone Encounter (Signed)
Pt would like medication called in for nasal congestion with loose cough and yellow mucus from nose.  Has been sick since Friday and doing home remedies.  Pt would like callback.  I also, transferred him to call a nurse for assistance in the mean time.

## 2013-04-16 MED ORDER — HYDROCODONE-HOMATROPINE 5-1.5 MG/5ML PO SYRP: ORAL_SOLUTION | ORAL | Status: DC

## 2013-04-16 NOTE — Telephone Encounter (Signed)
Rx for hydromet per Dr Tawanna Coolerodd. Patient is aware.

## 2013-04-27 ENCOUNTER — Other Ambulatory Visit: Payer: Self-pay | Admitting: Orthopaedic Surgery

## 2013-04-27 DIAGNOSIS — M25561 Pain in right knee: Secondary | ICD-10-CM

## 2013-05-01 ENCOUNTER — Ambulatory Visit
Admission: RE | Admit: 2013-05-01 | Discharge: 2013-05-01 | Disposition: A | Discharge status: Home / Self Care | Payer: BC Managed Care – PPO | Source: Ambulatory Visit | Attending: Orthopaedic Surgery | Admitting: Orthopaedic Surgery

## 2013-05-01 DIAGNOSIS — M25561 Pain in right knee: Secondary | ICD-10-CM

## 2013-06-08 ENCOUNTER — Other Ambulatory Visit: Payer: Self-pay | Admitting: Family Medicine

## 2013-07-11 ENCOUNTER — Other Ambulatory Visit: Payer: Self-pay | Admitting: Family

## 2013-07-13 ENCOUNTER — Other Ambulatory Visit: Payer: Self-pay | Admitting: Family Medicine

## 2013-08-13 ENCOUNTER — Telehealth: Payer: Self-pay | Admitting: Family Medicine

## 2013-08-13 ENCOUNTER — Other Ambulatory Visit: Payer: Self-pay | Admitting: Family Medicine

## 2013-08-13 NOTE — Telephone Encounter (Signed)
Pt is needing new rx VIAGRA 50 MG tablet, sent to [facility] [address] [address] [place].

## 2013-08-13 NOTE — Telephone Encounter (Signed)
Rx sent 

## 2013-10-05 ENCOUNTER — Other Ambulatory Visit: Payer: Self-pay | Admitting: Family Medicine

## 2013-10-09 ENCOUNTER — Telehealth: Payer: Self-pay | Admitting: *Deleted

## 2013-10-09 DIAGNOSIS — E119 Type 2 diabetes mellitus without complications: Secondary | ICD-10-CM

## 2013-10-09 NOTE — Telephone Encounter (Signed)
Spoke with patient and he will call back to schedule an appointment Lipid, a1c, bmet, micro albumin Diabetic bundle

## 2013-11-09 ENCOUNTER — Other Ambulatory Visit: Payer: Self-pay | Admitting: Family

## 2013-12-09 ENCOUNTER — Other Ambulatory Visit: Payer: Self-pay | Admitting: Family Medicine

## 2013-12-31 ENCOUNTER — Other Ambulatory Visit: Payer: Self-pay | Admitting: Family Medicine

## 2014-01-27 ENCOUNTER — Other Ambulatory Visit: Payer: Self-pay | Admitting: Family Medicine

## 2014-02-02 ENCOUNTER — Other Ambulatory Visit: Payer: Self-pay | Admitting: Family Medicine

## 2014-02-12 ENCOUNTER — Other Ambulatory Visit: Payer: Self-pay | Admitting: *Deleted

## 2014-02-12 MED ORDER — METFORMIN HCL 500 MG PO TABS: 500.0000 mg | ORAL_TABLET | Freq: Two times a day (BID) | ORAL | Status: DC

## 2014-02-12 MED ORDER — HYDROCHLOROTHIAZIDE 25 MG PO TABS: ORAL_TABLET | ORAL | Status: DC

## 2014-02-12 MED ORDER — RAMIPRIL 10 MG PO CAPS: 20.0000 mg | ORAL_CAPSULE | Freq: Every morning | ORAL | Status: DC

## 2014-02-21 ENCOUNTER — Other Ambulatory Visit: Payer: Self-pay | Admitting: Family Medicine

## 2014-03-11 ENCOUNTER — Other Ambulatory Visit: Payer: BC Managed Care – PPO

## 2014-03-11 ENCOUNTER — Ambulatory Visit: Payer: BC Managed Care – PPO | Admitting: Family Medicine

## 2014-03-18 ENCOUNTER — Ambulatory Visit: Payer: BC Managed Care – PPO | Admitting: Family Medicine

## 2014-03-30 ENCOUNTER — Other Ambulatory Visit: Payer: Self-pay | Admitting: Family Medicine

## 2014-03-30 ENCOUNTER — Encounter: Payer: BC Managed Care – PPO | Admitting: Family Medicine

## 2014-03-30 ENCOUNTER — Other Ambulatory Visit: Payer: BC Managed Care – PPO

## 2014-04-06 ENCOUNTER — Ambulatory Visit: Payer: BC Managed Care – PPO | Admitting: Family Medicine

## 2014-04-19 ENCOUNTER — Other Ambulatory Visit: Payer: Self-pay | Admitting: Family Medicine

## 2014-04-19 MED ORDER — METOPROLOL SUCCINATE ER 100 MG PO TB24: 100.0000 mg | ORAL_TABLET | Freq: Every day | ORAL | Status: DC

## 2014-04-19 NOTE — Telephone Encounter (Signed)
RX sent to pharmacy  

## 2014-04-19 NOTE — Telephone Encounter (Signed)
Pt request refill metoprolol succinate (TOPROL-XL) 100 MG 24 hr tablet  [facility] [address] Pt has appt w/ dr Tawanna Coolertodd 2/25/ but pt will be out of med tomorrow

## 2014-04-23 ENCOUNTER — Other Ambulatory Visit: Payer: Self-pay | Admitting: Family Medicine

## 2014-04-23 ENCOUNTER — Other Ambulatory Visit (INDEPENDENT_AMBULATORY_CARE_PROVIDER_SITE_OTHER): Payer: BC Managed Care – PPO

## 2014-04-23 DIAGNOSIS — E119 Type 2 diabetes mellitus without complications: Secondary | ICD-10-CM

## 2014-04-23 LAB — LIPID PANEL
Cholesterol: 114 mg/dL (ref 0–200)
HDL: 35.6 mg/dL — AB (ref >39.00)
LDL Cholesterol: 59 mg/dL (ref 0–99)
NONHDL: 78.4
TRIGLYCERIDES: 96 mg/dL (ref 0.0–149.0)
Total CHOL/HDL Ratio: 3
VLDL: 19.2 mg/dL (ref 0.0–40.0)

## 2014-04-23 LAB — BASIC METABOLIC PANEL
BUN: 21 mg/dL (ref 6–23)
CO2: 29 meq/L (ref 19–32)
CREATININE: 1.54 mg/dL — AB (ref 0.40–1.50)
Calcium: 9.6 mg/dL (ref 8.4–10.5)
Chloride: 106 mEq/L (ref 96–112)
GFR: 58.56 mL/min — ABNORMAL LOW (ref >60.00)
Glucose, Bld: 143 mg/dL — ABNORMAL HIGH (ref 70–99)
Potassium: 5.3 mEq/L — ABNORMAL HIGH (ref 3.5–5.1)
SODIUM: 141 meq/L (ref 135–145)

## 2014-04-23 LAB — MICROALBUMIN / CREATININE URINE RATIO
Creatinine,U: 185.3 mg/dL
MICROALB UR: 1.1 mg/dL (ref 0.0–1.9)
Microalb Creat Ratio: 0.6 mg/g (ref 0.0–30.0)

## 2014-04-23 LAB — HEMOGLOBIN A1C: HEMOGLOBIN A1C: 7.5 % — AB (ref 4.6–6.5)

## 2014-04-29 ENCOUNTER — Encounter: Payer: Self-pay | Admitting: Family Medicine

## 2014-04-29 ENCOUNTER — Ambulatory Visit (INDEPENDENT_AMBULATORY_CARE_PROVIDER_SITE_OTHER): Payer: BC Managed Care – PPO | Admitting: Family Medicine

## 2014-04-29 VITALS — BP 160/80 | Wt 246.0 lb

## 2014-04-29 DIAGNOSIS — Z23 Encounter for immunization: Secondary | ICD-10-CM

## 2014-04-29 DIAGNOSIS — I10 Essential (primary) hypertension: Secondary | ICD-10-CM

## 2014-04-29 DIAGNOSIS — E139 Other specified diabetes mellitus without complications: Secondary | ICD-10-CM

## 2014-04-29 MED ORDER — RAMIPRIL 10 MG PO CAPS: 20.0000 mg | ORAL_CAPSULE | Freq: Every morning | ORAL | Status: DC

## 2014-04-29 MED ORDER — GLIPIZIDE 10 MG PO TABS: ORAL_TABLET | ORAL | Status: DC

## 2014-04-29 MED ORDER — SIMVASTATIN 10 MG PO TABS: 10.0000 mg | ORAL_TABLET | Freq: Every day | ORAL | Status: DC

## 2014-04-29 MED ORDER — METFORMIN HCL 500 MG PO TABS: 500.0000 mg | ORAL_TABLET | Freq: Two times a day (BID) | ORAL | Status: DC

## 2014-04-29 MED ORDER — HYDROCHLOROTHIAZIDE 25 MG PO TABS: ORAL_TABLET | ORAL | Status: DC

## 2014-04-29 MED ORDER — METOPROLOL SUCCINATE ER 100 MG PO TB24: ORAL_TABLET | ORAL | Status: DC

## 2014-04-29 NOTE — Progress Notes (Signed)
Pre visit review using our clinic review tool, if applicable. No additional management support is needed unless otherwise documented below in the visit note. 

## 2014-04-29 NOTE — Addendum Note (Signed)
Addended by: Kern ReapVEREEN, Carroll Lingelbach B on: 04/29/2014 05:15 PM   Modules accepted: Orders

## 2014-04-29 NOTE — Patient Instructions (Signed)
Avoid all sugar,,,,,, especially Gatorade and Mountain Dew  Walk 30 minutes daily  Continue your current medications  Check a blood pressure and blood sugar Monday Wednesday Friday mornings  Keep a record of all your blood sugar and blood pressure readings  Return in May for a complete physical examination  Labs one week prior

## 2014-04-29 NOTE — Progress Notes (Signed)
   Subjective:    Patient ID: Mark Ali, male    DOB: 06/19/1950, 64 y.o.   MRN: [id number]  HPI Mark Ali is a 64 year old married male nonsmoker who comes in today for evaluation of hypertension and diabetes  His blood pressures 160/80. He takes Hydrocort thiazide 25 mg and Toprol 100 mg and Altace 10 mg twice a day. He's not been monitoring his blood pressure at home.  His blood sugar is 143 with an A1c of 7.5%. He's on a combination of metformin 500 mg twice a day and glipizide 10 mg twice a day. He's not been checking his blood sugar at home.  Last physical examination a urinary half ago   Review of Systems Review of systems otherwise negative he says he feels well    Objective:   Physical Exam Well-developed well-nourished male no acute distress vital signs stable he is afebrile BP elevated 160/90       Assessment & Plan:  Hypertension not at goal...........OMRON pump up digital blood pressure cuff.... [employer]........... check your blood pressure Monday Wednesday Friday mornings and keep a log  Diabetes type 2,,,,,,,,,,, discussed diet exercise and continuing current medication,,,,,,, follow-up CPX in May

## 2014-04-30 ENCOUNTER — Telehealth: Payer: Self-pay | Admitting: Family Medicine

## 2014-04-30 NOTE — Telephone Encounter (Signed)
emmi emailed °

## 2014-05-12 ENCOUNTER — Ambulatory Visit: Payer: BC Managed Care – PPO | Admitting: Family Medicine

## 2014-05-13 ENCOUNTER — Ambulatory Visit (INDEPENDENT_AMBULATORY_CARE_PROVIDER_SITE_OTHER): Payer: BC Managed Care – PPO | Admitting: Family Medicine

## 2014-05-13 ENCOUNTER — Ambulatory Visit: Payer: BC Managed Care – PPO | Admitting: Family Medicine

## 2014-05-13 ENCOUNTER — Encounter: Payer: Self-pay | Admitting: Family Medicine

## 2014-05-13 VITALS — Temp 97.9°F

## 2014-05-13 DIAGNOSIS — S90821A Blister (nonthermal), right foot, initial encounter: Secondary | ICD-10-CM

## 2014-05-13 DIAGNOSIS — S90829A Blister (nonthermal), unspecified foot, initial encounter: Secondary | ICD-10-CM | POA: Insufficient documentation

## 2014-05-13 DIAGNOSIS — E139 Other specified diabetes mellitus without complications: Secondary | ICD-10-CM

## 2014-05-13 NOTE — Patient Instructions (Addendum)
In the morning apply small amount of antibiotic ointment and a Band-Aid to the blister. Then put a wart pad on the hard callus.  In the evening take off the Band-Aid and the pad and leave the area open to the air and subcutaneous foot for 5 minutes in warm water before going to bed  Return in one week for follow-up

## 2014-05-13 NOTE — Progress Notes (Signed)
Pre visit review using our clinic review tool, if applicable. No additional management support is needed unless otherwise documented below in the visit note. 

## 2014-05-13 NOTE — Progress Notes (Signed)
   Subjective:    Patient ID: Mark Ali, male    DOB: 03/12/1950, 64 y.o.   MRN: [id number]  HPI Mark Ali is a 64 year old male who comes in today for evaluation of a blister on his right foot  He states he wore Parrish shoes and forgot to put his inserts in. He developed a blister on his right foot. It was draining clear fluid no pus. He tells me his blood sugar this morning was 107 last A1c's 2 weeks ago 7.5%.   Review of Systems Review of systems otherwise negative    Objective:   Physical Exam  Well-developed well-nourished male no acute distress vital signs stable he is afebrile examination of foot shows a blister that is ruptured and draining clear fluid at the base of the third and fourth toes. There is also a hard callus proximal to that      Assessment & Plan:  Blister right foot........ dead skin debrided advise dressing changes daily follow-up in one week

## 2014-05-20 ENCOUNTER — Ambulatory Visit (INDEPENDENT_AMBULATORY_CARE_PROVIDER_SITE_OTHER): Payer: BC Managed Care – PPO | Admitting: Family Medicine

## 2014-05-20 ENCOUNTER — Encounter: Payer: Self-pay | Admitting: Family Medicine

## 2014-05-20 VITALS — Temp 97.3°F

## 2014-05-20 DIAGNOSIS — S90821D Blister (nonthermal), right foot, subsequent encounter: Secondary | ICD-10-CM

## 2014-05-20 DIAGNOSIS — S90821A Blister (nonthermal), right foot, initial encounter: Secondary | ICD-10-CM

## 2014-05-20 NOTE — Patient Instructions (Signed)
Subcutaneous foot twice daily  Apply moleskin in the morning or antibiotic ointment and a Band-Aid  Return in one month for re-debridement

## 2014-05-20 NOTE — Progress Notes (Signed)
   Subjective:    Patient ID: Mark Ali, male    DOB: 02/06/1951, 64 y.o.   MRN: [id number]  HPI  Mark Ali is a 64 year old male who comes in today for follow-up of blister on his foot. He does have underlying diabetes. We saw him earlier in the week and abraded the blister. He's been doing dressing changes and warm soaks twice a day at home no complaints  Review of Systems    review of systems otherwise negative Objective:   Physical Exam  Well-developed well-nourished male no acute distress vital signs stable is afebrile examination right foot shows the blister the we debrided is well healed. The scar tissue was trimmed      Assessment & Plan:  Blister right heel debridement with scar tissue debrided outlined treatment program follow-up in one month

## 2014-05-20 NOTE — Progress Notes (Signed)
Pre visit review using our clinic review tool, if applicable. No additional management support is needed unless otherwise documented below in the visit note. 

## 2014-06-24 ENCOUNTER — Ambulatory Visit: Payer: BC Managed Care – PPO | Admitting: Family Medicine

## 2014-06-24 DIAGNOSIS — Z0289 Encounter for other administrative examinations: Secondary | ICD-10-CM

## 2014-06-25 ENCOUNTER — Telehealth: Payer: Self-pay | Admitting: *Deleted

## 2014-06-25 NOTE — Telephone Encounter (Signed)
Please schedule patient for a follow-up visit.

## 2014-06-25 NOTE — Telephone Encounter (Signed)
Per Dr Tawanna Coolerodd [facility]

## 2014-06-25 NOTE — Telephone Encounter (Signed)
[facility] Night - Client TELEPHONE ADVICE RECORD [facility] Patient Name: Mark Ali Gender: Male DOB: (approximate) Age: Return Phone Number: Address: City/State/Zip: [place] [address] Client [name] Night - Client Client Site [name] - Night Physician Todd, Jeff Contact Type Call Caller Name Kemauri Coppess Caller Phone Number na Relationship To Patient Self Is this call to report lab results? No Call Type General Information Initial Comment Caller states he needs to schedule a follow-up appointment. Missed his appointment today. General Information Type Appointment Nurse Assessment Guidelines Guideline Title Affirmed Question Affirmed Notes Nurse Date/Time (Eastern Time) Disp. Time (Eastern Time) Disposition Final User 06/24/2014 6:47:26 PM General Information Provided Yes Chambers, Shelby After Care Instructions Given Call Event Type User Date / Time Description 

## 2014-06-29 NOTE — Telephone Encounter (Signed)
Pt decline to see to see NP and will stick with dr todd.

## 2014-06-30 NOTE — Telephone Encounter (Signed)
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

## 2014-06-30 NOTE — Telephone Encounter (Signed)
With patient's history of no shows and late cancellations, I suggest pt be discharged from practice.

## 2014-07-14 NOTE — Telephone Encounter (Signed)
Dr Tawanna Coolerodd is aware and will continue to see Mr Mark Ali

## 2014-07-20 ENCOUNTER — Other Ambulatory Visit: Payer: BC Managed Care – PPO

## 2014-07-22 ENCOUNTER — Other Ambulatory Visit (INDEPENDENT_AMBULATORY_CARE_PROVIDER_SITE_OTHER): Payer: BC Managed Care – PPO

## 2014-07-22 DIAGNOSIS — E139 Other specified diabetes mellitus without complications: Secondary | ICD-10-CM | POA: Diagnosis not present

## 2014-07-22 DIAGNOSIS — I10 Essential (primary) hypertension: Secondary | ICD-10-CM | POA: Diagnosis not present

## 2014-07-22 LAB — BASIC METABOLIC PANEL
BUN: 23 mg/dL (ref 6–23)
CHLORIDE: 106 meq/L (ref 96–112)
CO2: 30 meq/L (ref 19–32)
CREATININE: 1.54 mg/dL — AB (ref 0.40–1.50)
Calcium: 10 mg/dL (ref 8.4–10.5)
GFR: 58.52 mL/min — ABNORMAL LOW (ref >60.00)
Glucose, Bld: 109 mg/dL — ABNORMAL HIGH (ref 70–99)
Potassium: 5.4 mEq/L — ABNORMAL HIGH (ref 3.5–5.1)
Sodium: 140 mEq/L (ref 135–145)

## 2014-07-22 LAB — PSA: PSA: 0.36 ng/mL (ref 0.10–4.00)

## 2014-07-22 LAB — CBC WITH DIFFERENTIAL/PLATELET
BASOS PCT: 0.5 % (ref 0.0–3.0)
Basophils Absolute: 0 10*3/uL (ref 0.0–0.1)
EOS PCT: 3.3 % (ref 0.0–5.0)
Eosinophils Absolute: 0.1 10*3/uL (ref 0.0–0.7)
HEMATOCRIT: 37.4 % — AB (ref 39.0–52.0)
HEMOGLOBIN: 12.5 g/dL — AB (ref 13.0–17.0)
LYMPHS ABS: 1.4 10*3/uL (ref 0.7–4.0)
Lymphocytes Relative: 32.2 % (ref 12.0–46.0)
MCHC: 33.4 g/dL (ref 30.0–36.0)
MCV: 94.8 fl (ref 78.0–100.0)
MONO ABS: 0.4 10*3/uL (ref 0.1–1.0)
MONOS PCT: 10.1 % (ref 3.0–12.0)
Neutro Abs: 2.4 10*3/uL (ref 1.4–7.7)
Neutrophils Relative %: 53.9 % (ref 43.0–77.0)
Platelets: 247 10*3/uL (ref 150.0–400.0)
RBC: 3.95 Mil/uL — AB (ref 4.22–5.81)
RDW: 14.3 % (ref 11.5–15.5)
WBC: 4.4 10*3/uL (ref 4.0–10.5)

## 2014-07-22 LAB — POCT URINALYSIS DIPSTICK
BILIRUBIN UA: NEGATIVE
Blood, UA: NEGATIVE
GLUCOSE UA: NEGATIVE
KETONES UA: NEGATIVE
Leukocytes, UA: NEGATIVE
NITRITE UA: NEGATIVE
Protein, UA: NEGATIVE
Spec Grav, UA: 1.025
Urobilinogen, UA: 0.2
pH, UA: 6

## 2014-07-22 LAB — HEPATIC FUNCTION PANEL
ALBUMIN: 4 g/dL (ref 3.5–5.2)
ALT: 12 U/L (ref 0–53)
AST: 15 U/L (ref 0–37)
Alkaline Phosphatase: 60 U/L (ref 39–117)
Bilirubin, Direct: 0.1 mg/dL (ref 0.0–0.3)
Total Bilirubin: 0.5 mg/dL (ref 0.2–1.2)
Total Protein: 6.7 g/dL (ref 6.0–8.3)

## 2014-07-22 LAB — HEMOGLOBIN A1C: Hgb A1c MFr Bld: 6.9 % — ABNORMAL HIGH (ref 4.6–6.5)

## 2014-07-22 LAB — TSH: TSH: 0.78 u[IU]/mL (ref 0.35–4.50)

## 2014-07-24 ENCOUNTER — Other Ambulatory Visit: Payer: Self-pay | Admitting: Family Medicine

## 2014-07-27 ENCOUNTER — Encounter: Payer: BC Managed Care – PPO | Admitting: Family Medicine

## 2014-08-13 ENCOUNTER — Encounter: Payer: BC Managed Care – PPO | Admitting: Adult Health

## 2014-08-16 ENCOUNTER — Other Ambulatory Visit: Payer: Self-pay | Admitting: Family Medicine

## 2014-08-31 ENCOUNTER — Encounter: Payer: BC Managed Care – PPO | Admitting: Adult Health

## 2014-09-01 ENCOUNTER — Ambulatory Visit (INDEPENDENT_AMBULATORY_CARE_PROVIDER_SITE_OTHER): Payer: BC Managed Care – PPO | Admitting: Adult Health

## 2014-09-01 ENCOUNTER — Encounter: Payer: Self-pay | Admitting: Adult Health

## 2014-09-01 VITALS — BP 124/78 | Temp 97.7°F | Ht 76.0 in | Wt 243.1 lb

## 2014-09-01 DIAGNOSIS — N529 Male erectile dysfunction, unspecified: Secondary | ICD-10-CM

## 2014-09-01 DIAGNOSIS — Z0000 Encounter for general adult medical examination without abnormal findings: Secondary | ICD-10-CM

## 2014-09-01 DIAGNOSIS — I10 Essential (primary) hypertension: Secondary | ICD-10-CM

## 2014-09-01 DIAGNOSIS — Z23 Encounter for immunization: Secondary | ICD-10-CM

## 2014-09-01 DIAGNOSIS — E139 Other specified diabetes mellitus without complications: Secondary | ICD-10-CM | POA: Diagnosis not present

## 2014-09-01 MED ORDER — TADALAFIL 5 MG PO TABS: 5.0000 mg | ORAL_TABLET | Freq: Every day | ORAL | Status: DC | PRN

## 2014-09-01 NOTE — Progress Notes (Signed)
Subjective:    Patient ID: Mark Ali, male    DOB: 12/16/1950, 64 y.o.   MRN: [id number]    HPI Mark Ali presents to the office today for his annual wellness exam. He is a patient of Dr. Nelida Meuseodd's who I am seeing in Dr. Nelida Meuseodd's absence. He has a history of diabetes type 2, hypertension, hyperlipidemia, and erectile dysfunction.  He states his blood sugars well controlled at home usually in the 110 range, his A1c is 6.9  His blood pressure is well controlled on his current medication regimen, currently in the office today it is 124/78  He denies any interval history. And gets his regular checkups at the dentist and eye doctor. It is been 10 years since his last colonoscopy area and  He denies having any issues today that he would like to discuss.  We discussed his lab results in detail. Reviewed any health maintenance issues that needed to be addressed due to patient's age race and sex. He was updated on his immunizations with Prevnar 13. He will check with insurance about the shingles vaccination  Review of Systems  Constitutional: Negative.   HENT: Negative.   Eyes: Negative.   Respiratory: Negative.   Cardiovascular: Negative.   Gastrointestinal: Negative.   Endocrine: Negative.   Genitourinary: Negative.   Musculoskeletal: Negative.   Skin: Negative.   Allergic/Immunologic: Negative.   Neurological: Negative.   Hematological: Negative.   Psychiatric/Behavioral: Negative.   All other systems reviewed and are negative.  No past medical history on file.  History   Social History  . Marital Status: Married    Spouse Name: N/A  . Number of Children: N/A  . Years of Education: N/A   Occupational History  . Not on file.   Social History Main Topics  . Smoking status: Never Smoker   . Smokeless tobacco: Not on file  . Alcohol Use: No  . Drug Use: No  . Sexual Activity: Not on file   Other Topics Concern  . Not on file   Social History Narrative     Past Surgical History  Procedure Laterality Date  . Multiple tooth extractions      Family History  Problem Relation Age of Onset  . Diabetes Other   . Kidney disease Other     No Known Allergies  Current Outpatient Prescriptions on File Prior to Visit  Medication Sig Dispense Refill  . glipiZIDE (GLUCOTROL) 10 MG tablet TAKE 1 TABLET BY MOUTH TWICE DAILY BEFORE A MEAL 180 tablet 3  . hydrochlorothiazide (HYDRODIURIL) 25 MG tablet TAKE 1 TABLET BY MOUTH EVERY DAY 100 tablet 1  . metFORMIN (GLUCOPHAGE) 500 MG tablet Take 1 tablet (500 mg total) by mouth 2 (two) times daily with a meal. 180 tablet 3  . metoprolol succinate (TOPROL-XL) 100 MG 24 hr tablet TAKE 1 TABLET BY MOUTH EVERY DAY 90 tablet 3  . ramipril (ALTACE) 10 MG capsule Take 2 capsules (20 mg total) by mouth every morning. 180 capsule 3  . glipiZIDE (GLUCOTROL) 10 MG tablet TAKE 1 TABLET BY MOUTH TWICE DAILY, BEFORE A MEAL 180 tablet 2  . simvastatin (ZOCOR) 10 MG tablet Take 1 tablet (10 mg total) by mouth at bedtime. 100 tablet 3  . VIAGRA 50 MG tablet TAKE 1 TABLET BY MOUTH AS NEEDED FOR ERECTILE DYSFUNCTION 6 tablet 5   No current facility-administered medications on file prior to visit.    BP 124/78 mmHg  Temp(Src) 97.7 F (36.5 C) (Oral)  Ht  (1.93 m)  Wt 243 lb 1.6 oz (110.269 kg)  BMI 29.60 kg/m2       Objective:   Physical Exam  Constitutional: He is oriented to person, place, and time. He appears well-developed and well-nourished. No distress.  obese  HENT:  Head: Normocephalic and atraumatic.  Right Ear: External ear normal.  Left Ear: External ear normal.  Nose: Nose normal.  Mouth/Throat: Oropharynx is clear and moist. No oropharyngeal exudate.  TM's visualized  Eyes: Conjunctivae and EOM are normal. Pupils are equal, round, and reactive to light. Right eye exhibits no discharge. Left eye exhibits no discharge.  Neck: Normal range of motion. Neck supple. No tracheal deviation  present. No thyromegaly present.  Cardiovascular: Normal rate, regular rhythm, normal heart sounds and intact distal pulses.  Exam reveals no gallop and no friction rub.   No murmur heard. No carotid bruit  Pulmonary/Chest: Effort normal and breath sounds normal. No respiratory distress. He has no wheezes. He has no rales. He exhibits no tenderness.  Abdominal: Soft. Bowel sounds are normal. He exhibits no distension and no mass. There is no tenderness. There is no rebound and no guarding.  Genitourinary:  Deferred  Musculoskeletal: Normal range of motion. He exhibits no edema or tenderness.  Lymphadenopathy:    He has no cervical adenopathy.  Neurological: He is alert and oriented to person, place, and time. He has normal reflexes.  Skin: Skin is warm and dry. No rash noted. He is not diaphoretic. No erythema. No pallor.  Psychiatric: He has a normal mood and affect. His behavior is normal. Judgment and thought content normal.  Nursing note and vitals reviewed.      Assessment & Plan:  1. Routine general medical examination at a health care facility -Follow-up in 1 year for complete physical exam -Follow-up as needed -Follow a diabetic diet and exercise on a regular basis   2. Essential hypertension -Controlled on current medication regimen. 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 No change in medication  -Diabetic foot exam done -Continue to monitor blood sugars at home  4. Need for pneumococcal vaccine  - Pneumococcal conjugate vaccine 13-valent IM   5. Erectile dysfunction, unspecified erectile dysfunction type -He would like to trial Cialis - tadalafil (CIALIS) 5 MG  tablet; Take 1 tablet (5 mg total) by mouth daily as needed for erectile dysfunction.  Dispense: 30 tablet; Refill: 6

## 2014-09-01 NOTE — Addendum Note (Signed)
Addended by: Nancy FetterNAFZIGER, Drewey Begue L on: 09/01/2014 05:32 PM   Modules accepted: Level of Service, SmartSet

## 2014-09-01 NOTE — Patient Instructions (Addendum)
It was nice meeting you today. Please continue to exercise and eat a heart healthy diet. If you need anything, please let me know.    Health Maintenance A healthy lifestyle and preventative care can promote health and wellness.  Maintain regular health, dental, and eye exams.  Eat a healthy diet. Foods like vegetables, fruits, whole grains, low-fat dairy products, and lean protein foods contain the nutrients you need and are low in calories. Decrease your intake of foods high in solid fats, added sugars, and salt. Get information about a proper diet from your health care provider, if necessary.  Regular physical exercise is one of the most important things you can do for your health. Most adults should get at least 150 minutes of moderate-intensity exercise (any activity that increases your heart rate and causes you to sweat) each week. In addition, most adults need muscle-strengthening exercises on 2 or more days a week.   Maintain a healthy weight. The body mass index (BMI) is a screening tool to identify possible weight problems. It provides an estimate of body fat based on height and weight. Your health care provider can find your BMI and can help you achieve or maintain a healthy weight. For males 20 years and older:  A BMI below 18.5 is considered underweight.  A BMI of 18.5 to 24.9 is normal.  A BMI of 25 to 29.9 is considered overweight.  A BMI of 30 and above is considered obese.  Maintain normal blood lipids and cholesterol by exercising and minimizing your intake of saturated fat. Eat a balanced diet with plenty of fruits and vegetables. Blood tests for lipids and cholesterol should begin at age 66 and be repeated every 5 years. If your lipid or cholesterol levels are high, you are over age 66, or you are at high risk for heart disease, you may need your cholesterol levels checked more frequently.Ongoing high lipid and cholesterol levels should be treated with medicines if diet and  exercise are not working.  If you smoke, find out from your health care provider how to quit. If you do not use tobacco, do not start.  Lung cancer screening is recommended for adults aged 55-80 years who are at high risk for developing lung cancer because of a history of smoking. A yearly low-dose CT scan of the lungs is recommended for people who have at least a 30-pack-year history of smoking and are current smokers or have quit within the past 15 years. A pack year of smoking is smoking an average of 1 pack of cigarettes a day for 1 year (for example, a 30-pack-year history of smoking could mean smoking 1 pack a day for 30 years or 2 packs a day for 15 years). Yearly screening should continue until the smoker has stopped smoking for at least 15 years. Yearly screening should be stopped for people who develop a health problem that would prevent them from having lung cancer treatment.  If you choose to drink alcohol, do not have more than 2 drinks per day. One drink is considered to be 12 oz (360 mL) of beer, 5 oz (150 mL) of wine, or 1.5 oz (45 mL) of liquor.  Avoid the use of street drugs. Do not share needles with anyone. Ask for help if you need support or instructions about stopping the use of drugs.  High blood pressure causes heart disease and increases the risk of stroke. Blood pressure should be checked at least every 1-2 years. Ongoing high blood  pressure should be treated with medicines if weight loss and exercise are not effective.  If you are 57-60 years old, ask your health care provider if you should take aspirin to prevent heart disease.  Diabetes screening involves taking a blood sample to check your fasting blood sugar level. This should be done once every 3 years after age 33 if you are at a normal weight and without risk factors for diabetes. Testing should be considered at a younger age or be carried out more frequently if you are overweight and have at least 1 risk factor for  diabetes.  Colorectal cancer can be detected and often prevented. Most routine colorectal cancer screening begins at the age of 69 and continues through age 58. However, your health care provider may recommend screening at an earlier age if you have risk factors for colon cancer. On a yearly basis, your health care provider may provide home test kits to check for hidden blood in the stool. A small camera at the end of a tube may be used to directly examine the colon (sigmoidoscopy or colonoscopy) to detect the earliest forms of colorectal cancer. Talk to your health care provider about this at age 29 when routine screening begins. A direct exam of the colon should be repeated every 5-10 years through age 94, unless early forms of precancerous polyps or small growths are found.  People who are at an increased risk for hepatitis B should be screened for this virus. You are considered at high risk for hepatitis B if:  You were born in a country where hepatitis B occurs often. Talk with your health care provider about which countries are considered high risk.  Your parents were born in a high-risk country and you have not received a shot to protect against hepatitis B (hepatitis B vaccine).  You have HIV or AIDS.  You use needles to inject street drugs.  You live with, or have sex with, someone who has hepatitis B.  You are a man who has sex with other men (MSM).  You get hemodialysis treatment.  You take certain medicines for conditions like cancer, organ transplantation, and autoimmune conditions.  Hepatitis C blood testing is recommended for all people born from [birth year] through 1967 and any individual with known risk factors for hepatitis C.  Healthy men should no longer receive prostate-specific antigen (PSA) blood tests as part of routine cancer screening. Talk to your health care provider about prostate cancer screening.  Testicular cancer screening is not recommended for adolescents or  adult males who have no symptoms. Screening includes self-exam, a health care provider exam, and other screening tests. Consult with your health care provider about any symptoms you have or any concerns you have about testicular cancer.  Practice safe sex. Use condoms and avoid high-risk sexual practices to reduce the spread of sexually transmitted infections (STIs).  You should be screened for STIs, including gonorrhea and chlamydia if:  You are sexually active and are younger than 24 years.  You are older than 24 years, and your health care provider tells you that you are at risk for this type of infection.  Your sexual activity has changed since you were last screened, and you are at an increased risk for chlamydia or gonorrhea. Ask your health care provider if you are at risk.  If you are at risk of being infected with HIV, it is recommended that you take a prescription medicine daily to prevent HIV infection. This is called pre-exposure  prophylaxis (PrEP). You are considered at risk if:  You are a man who has sex with other men (MSM).  You are a heterosexual man who is sexually active with multiple partners.  You take drugs by injection.  You are sexually active with a partner who has HIV.  Talk with your health care provider about whether you are at high risk of being infected with HIV. If you choose to begin PrEP, you should first be tested for HIV. You should then be tested every 3 months for as long as you are taking PrEP.  Use sunscreen. Apply sunscreen liberally and repeatedly throughout the day. You should seek shade when your shadow is shorter than you. Protect yourself by wearing long sleeves, pants, a wide-brimmed hat, and sunglasses year round whenever you are outdoors.  Tell your health care provider of new moles or changes in moles, especially if there is a change in shape or color. Also, tell your health care provider if a mole is larger than the size of a pencil  eraser.  A one-time screening for abdominal aortic aneurysm (AAA) and surgical repair of large AAAs by ultrasound is recommended for men aged 1-75 years who are current or former smokers.  Stay current with your vaccines (immunizations). Document Released: 08/18/2007 Document Revised: 02/24/2013 Document Reviewed: 07/17/2010 [facility] Patient Information 2015 [place], [place]. This information is not intended to replace advice given to you by your health care provider. Make sure you discuss any questions you have with your health care provider.

## 2014-09-03 ENCOUNTER — Ambulatory Visit (INDEPENDENT_AMBULATORY_CARE_PROVIDER_SITE_OTHER): Payer: BC Managed Care – PPO | Admitting: *Deleted

## 2014-09-03 ENCOUNTER — Other Ambulatory Visit: Payer: Self-pay | Admitting: Adult Health

## 2014-09-03 DIAGNOSIS — Z23 Encounter for immunization: Secondary | ICD-10-CM

## 2014-09-03 DIAGNOSIS — N529 Male erectile dysfunction, unspecified: Secondary | ICD-10-CM

## 2014-09-03 MED ORDER — SILDENAFIL CITRATE 20 MG PO TABS: 20.0000 mg | ORAL_TABLET | Freq: Every day | ORAL | Status: DC

## 2015-05-07 ENCOUNTER — Other Ambulatory Visit: Payer: Self-pay | Admitting: Family Medicine

## 2015-05-24 ENCOUNTER — Other Ambulatory Visit: Payer: Self-pay | Admitting: Family Medicine

## 2015-06-27 ENCOUNTER — Other Ambulatory Visit: Payer: Self-pay | Admitting: Family Medicine

## 2015-08-05 ENCOUNTER — Other Ambulatory Visit: Payer: Self-pay | Admitting: Family Medicine

## 2015-08-20 ENCOUNTER — Other Ambulatory Visit: Payer: Self-pay | Admitting: Family Medicine

## 2015-08-30 ENCOUNTER — Other Ambulatory Visit: Payer: Self-pay | Admitting: Family Medicine

## 2015-08-30 NOTE — Telephone Encounter (Signed)
Pt need new Rx for Viagra   [facility] [place]

## 2015-08-30 NOTE — Telephone Encounter (Signed)
Rx was e-scribed earlier today. Closing encounter.

## 2015-09-01 ENCOUNTER — Other Ambulatory Visit: Payer: Self-pay | Admitting: Family Medicine

## 2015-10-01 ENCOUNTER — Other Ambulatory Visit: Payer: Self-pay | Admitting: Family Medicine

## 2015-10-19 ENCOUNTER — Other Ambulatory Visit: Payer: Self-pay | Admitting: Family Medicine

## 2015-11-02 ENCOUNTER — Other Ambulatory Visit: Payer: Self-pay | Admitting: Family Medicine

## 2015-11-11 ENCOUNTER — Other Ambulatory Visit: Payer: Self-pay | Admitting: Family Medicine

## 2015-11-24 ENCOUNTER — Other Ambulatory Visit: Payer: Self-pay | Admitting: Adult Health

## 2015-11-25 NOTE — Telephone Encounter (Signed)
Dr. Todd patient  

## 2015-11-25 NOTE — Telephone Encounter (Signed)
Patient was last seen 09/01/14 - ok to refill?

## 2015-12-02 ENCOUNTER — Other Ambulatory Visit: Payer: Self-pay | Admitting: Adult Health

## 2015-12-02 DIAGNOSIS — N529 Male erectile dysfunction, unspecified: Secondary | ICD-10-CM

## 2015-12-02 NOTE — Telephone Encounter (Signed)
Ok to refill 

## 2015-12-02 NOTE — Telephone Encounter (Signed)
Ok to refill for one year  

## 2015-12-20 ENCOUNTER — Other Ambulatory Visit: Payer: Self-pay | Admitting: Family Medicine

## 2015-12-27 ENCOUNTER — Other Ambulatory Visit: Payer: BC Managed Care – PPO

## 2015-12-31 ENCOUNTER — Other Ambulatory Visit: Payer: Self-pay | Admitting: Family Medicine

## 2016-01-03 ENCOUNTER — Encounter: Payer: BC Managed Care – PPO | Admitting: Adult Health

## 2016-01-04 ENCOUNTER — Other Ambulatory Visit: Payer: Self-pay | Admitting: Family Medicine

## 2016-01-20 ENCOUNTER — Other Ambulatory Visit: Payer: BC Managed Care – PPO

## 2016-01-25 ENCOUNTER — Other Ambulatory Visit: Payer: Self-pay | Admitting: Family Medicine

## 2016-01-25 ENCOUNTER — Other Ambulatory Visit (INDEPENDENT_AMBULATORY_CARE_PROVIDER_SITE_OTHER): Payer: BC Managed Care – PPO

## 2016-01-25 ENCOUNTER — Encounter: Payer: BC Managed Care – PPO | Admitting: Adult Health

## 2016-01-25 DIAGNOSIS — Z0000 Encounter for general adult medical examination without abnormal findings: Secondary | ICD-10-CM

## 2016-01-25 LAB — CBC WITH DIFFERENTIAL/PLATELET
BASOS PCT: 0.6 % (ref 0.0–3.0)
Basophils Absolute: 0 10*3/uL (ref 0.0–0.1)
EOS ABS: 0.2 10*3/uL (ref 0.0–0.7)
EOS PCT: 4.5 % (ref 0.0–5.0)
HEMATOCRIT: 38.8 % — AB (ref 39.0–52.0)
HEMOGLOBIN: 12.8 g/dL — AB (ref 13.0–17.0)
LYMPHS PCT: 28.7 % (ref 12.0–46.0)
Lymphs Abs: 1.3 10*3/uL (ref 0.7–4.0)
MCHC: 33.1 g/dL (ref 30.0–36.0)
MCV: 96.1 fl (ref 78.0–100.0)
MONOS PCT: 9.8 % (ref 3.0–12.0)
Monocytes Absolute: 0.5 10*3/uL (ref 0.1–1.0)
Neutro Abs: 2.7 10*3/uL (ref 1.4–7.7)
Neutrophils Relative %: 56.4 % (ref 43.0–77.0)
Platelets: 220 10*3/uL (ref 150.0–400.0)
RBC: 4.04 Mil/uL — ABNORMAL LOW (ref 4.22–5.81)
RDW: 13.7 % (ref 11.5–15.5)
WBC: 4.7 10*3/uL (ref 4.0–10.5)

## 2016-01-25 LAB — LIPID PANEL
CHOL/HDL RATIO: 3
CHOLESTEROL: 125 mg/dL (ref 0–200)
HDL: 39.3 mg/dL (ref >39.00)
LDL CALC: 67 mg/dL (ref 0–99)
NonHDL: 85.88
Triglycerides: 95 mg/dL (ref 0.0–149.0)
VLDL: 19 mg/dL (ref 0.0–40.0)

## 2016-01-25 LAB — BASIC METABOLIC PANEL
BUN: 19 mg/dL (ref 6–23)
CALCIUM: 9.3 mg/dL (ref 8.4–10.5)
CO2: 30 meq/L (ref 19–32)
Chloride: 104 mEq/L (ref 96–112)
Creatinine, Ser: 1.58 mg/dL — ABNORMAL HIGH (ref 0.40–1.50)
GFR: 56.55 mL/min — AB (ref >60.00)
Glucose, Bld: 107 mg/dL — ABNORMAL HIGH (ref 70–99)
Potassium: 5.1 mEq/L (ref 3.5–5.1)
SODIUM: 140 meq/L (ref 135–145)

## 2016-01-25 LAB — POC URINALSYSI DIPSTICK (AUTOMATED)
BILIRUBIN UA: NEGATIVE
Glucose, UA: NEGATIVE
KETONES UA: NEGATIVE
Leukocytes, UA: NEGATIVE
Nitrite, UA: NEGATIVE
PH UA: 7
RBC UA: NEGATIVE
Spec Grav, UA: 1.015
Urobilinogen, UA: 4

## 2016-01-25 LAB — HEPATIC FUNCTION PANEL
ALT: 10 U/L (ref 0–53)
AST: 12 U/L (ref 0–37)
Albumin: 3.9 g/dL (ref 3.5–5.2)
Alkaline Phosphatase: 63 U/L (ref 39–117)
BILIRUBIN DIRECT: 0.1 mg/dL (ref 0.0–0.3)
BILIRUBIN TOTAL: 0.5 mg/dL (ref 0.2–1.2)
TOTAL PROTEIN: 6.7 g/dL (ref 6.0–8.3)

## 2016-01-25 LAB — HEMOGLOBIN A1C: Hgb A1c MFr Bld: 7.1 % — ABNORMAL HIGH (ref 4.6–6.5)

## 2016-01-25 LAB — MICROALBUMIN / CREATININE URINE RATIO
Creatinine,U: 186.4 mg/dL
Microalb Creat Ratio: 0.4 mg/g (ref 0.0–30.0)
Microalb, Ur: <0.7 mg/dL (ref 0.0–1.9)

## 2016-01-25 LAB — PSA: PSA: 0.5 ng/mL (ref 0.10–4.00)

## 2016-01-25 LAB — TSH: TSH: 0.72 u[IU]/mL (ref 0.35–4.50)

## 2016-01-29 ENCOUNTER — Other Ambulatory Visit: Payer: Self-pay | Admitting: Family Medicine

## 2016-02-02 ENCOUNTER — Encounter: Payer: Self-pay | Admitting: Adult Health

## 2016-02-02 ENCOUNTER — Ambulatory Visit (INDEPENDENT_AMBULATORY_CARE_PROVIDER_SITE_OTHER): Payer: BC Managed Care – PPO | Admitting: Adult Health

## 2016-02-02 VITALS — BP 132/84 | Temp 98.4°F | Ht 76.0 in | Wt 241.6 lb

## 2016-02-02 DIAGNOSIS — E109 Type 1 diabetes mellitus without complications: Secondary | ICD-10-CM

## 2016-02-02 DIAGNOSIS — Z0000 Encounter for general adult medical examination without abnormal findings: Secondary | ICD-10-CM | POA: Diagnosis not present

## 2016-02-02 DIAGNOSIS — Z23 Encounter for immunization: Secondary | ICD-10-CM | POA: Diagnosis not present

## 2016-02-02 DIAGNOSIS — I10 Essential (primary) hypertension: Secondary | ICD-10-CM | POA: Diagnosis not present

## 2016-02-02 DIAGNOSIS — Z1211 Encounter for screening for malignant neoplasm of colon: Secondary | ICD-10-CM | POA: Diagnosis not present

## 2016-02-02 DIAGNOSIS — E139 Other specified diabetes mellitus without complications: Secondary | ICD-10-CM

## 2016-02-02 MED ORDER — METFORMIN HCL 1000 MG PO TABS: 1000.0000 mg | ORAL_TABLET | Freq: Two times a day (BID) | ORAL | 3 refills | Status: DC

## 2016-02-02 NOTE — Progress Notes (Signed)
Pre visit review using our clinic review tool, if applicable. No additional management support is needed unless otherwise documented below in the visit note. 

## 2016-02-02 NOTE — Progress Notes (Signed)
Subjective:    Patient ID: Mark Cunas Sr., male    DOB: [birth year], 65 y.o.   MRN: [id number]  HPI  Patient presents for yearly preventative medicine examination. He is a pleasant 65 year old male who  has a past medical history of Diabetes type 2, controlled (HCC); Hyperlipidemia; and Hypertension.  All immunizations and health maintenance protocols were reviewed with the patient and needed orders were placed.  Medication reconciliation,  past medical history, social history, problem list and allergies were reviewed in detail with the patient  Goals were established with regard to weight loss, exercise, and  diet in compliance with medications. He is trying to eat healthy but is not exercising as he has in the past  End of life planning was discussed.He has an advanced directive and living will  He takes Metformin 500mg  BID and glipizide 10 mg for diabetes.   He takes Toprol - XL 100mg  and HCTZ 25 mg for hypertension. His blood pressure has been controlled in the past  BP Readings from Last 3 Encounters:  02/02/16 132/84  09/01/14 124/78  04/29/14 (!) 160/80    He takes Zocor 10 mg for hyperlipidemia.   He gets routine dental and vision care. He has not had a colonoscopy in the last 10 years.    Review of Systems  Constitutional: Negative.   HENT: Negative.   Eyes: Negative.   Respiratory: Negative.   Cardiovascular: Negative.   Gastrointestinal: Negative.   Endocrine: Negative.   Genitourinary: Negative.   Musculoskeletal: Negative.   Skin: Negative.   Allergic/Immunologic: Negative.   Neurological: Negative.   Hematological: Negative.   Psychiatric/Behavioral: Negative.    Past Medical History:  Diagnosis Date  . Diabetes type 2, controlled (HCC)   . Hyperlipidemia   . Hypertension     Social History   Social History  . Marital status: Married    Spouse name: N/A  . Number of children: N/A  . Years of education: N/A   Occupational History    . Not on file.   Social History Main Topics  . Smoking status: Never Smoker  . Smokeless tobacco: Never Used  . Alcohol use No  . Drug use: No  . Sexual activity: Not on file   Other Topics Concern  . Not on file   Social History Narrative  . No narrative on file    Past Surgical History:  Procedure Laterality Date  . MULTIPLE TOOTH EXTRACTIONS      Family History  Problem Relation Age of Onset  . Diabetes Other   . Kidney disease Other     No Known Allergies  Current Outpatient Prescriptions on File Prior to Visit  Medication Sig Dispense Refill  . glipiZIDE (GLUCOTROL) 10 MG tablet TAKE 1 TABLET BY MOUTH TWICE DAILY BEFORE A MEAL 180 tablet 0  . hydrochlorothiazide (HYDRODIURIL) 25 MG tablet TAKE 1 TABLET BY MOUTH EVERY DAY 100 tablet 1  . metoprolol succinate (TOPROL-XL) 100 MG 24 hr tablet TAKE 1 TABLET BY MOUTH EVERY DAY 90 tablet 0  . ramipril (ALTACE) 10 MG capsule TAKE 2 CAPSULES BY MOUTH EVERY MORNING 180 capsule 0  . simvastatin (ZOCOR) 10 MG tablet TAKE 1 TABLET BY MOUTH AT BEDTIME 100 tablet 0  . VIAGRA 50 MG tablet TAKE 1 TABLET BY MOUTH AS NEEDED FOR ERECTILE DYSFUNCTION 6 tablet 0  . glipiZIDE (GLUCOTROL) 10 MG tablet TAKE 1 TABLET BY MOUTH TWICE DAILY BEFORE A MEAL (Patient not taking: Reported on 02/02/2016)  180 tablet 0  . hydrochlorothiazide (HYDRODIURIL) 25 MG tablet TAKE 1 TABLET BY MOUTH EVERY DAY (Patient not taking: Reported on 02/02/2016) 90 tablet 0  . hydrochlorothiazide (HYDRODIURIL) 25 MG tablet TAKE 1 TABLET BY MOUTH DAILY (Patient not taking: Reported on 02/02/2016) 30 tablet 0  . hydrochlorothiazide (HYDRODIURIL) 25 MG tablet TAKE 1 TABLET BY MOUTH DAILY (Patient not taking: Reported on 02/02/2016) 100 tablet 0  . sildenafil (REVATIO) 20 MG tablet TAKE 1 TABLET(20 MG) BY MOUTH DAILY (Patient not taking: Reported on 02/02/2016) 90 tablet 3  . simvastatin (ZOCOR) 10 MG tablet TAKE 1 TABLET BY MOUTH AT BEDTIME (Patient not taking: Reported on  02/02/2016) 100 tablet 0   No current facility-administered medications on file prior to visit.     BP 132/84   Temp 98.4 F (36.9 C) (Oral)   Ht 6\' 4"  (1.93 m)   Wt 241 lb 9.6 oz (109.6 kg)   BMI 29.41 kg/m       Objective:   Physical Exam  Constitutional: He is oriented to person, place, and time. He appears well-developed and well-nourished. No distress.  Obese  HENT:  Head: Normocephalic and atraumatic.  Right Ear: External ear normal.  Left Ear: External ear normal.  Nose: Nose normal.  Mouth/Throat: Oropharynx is clear and moist. No oropharyngeal exudate.  Eyes: Conjunctivae and EOM are normal. Pupils are equal, round, and reactive to light. Right eye exhibits no discharge. Left eye exhibits no discharge. No scleral icterus.  Neck: Trachea normal and normal range of motion. Neck supple. No JVD present. No tracheal tenderness present. Carotid bruit is present. No tracheal deviation present. No thyroid mass and no thyromegaly present.  Cardiovascular: Normal rate, regular rhythm, normal heart sounds and intact distal pulses.  Exam reveals no gallop and no friction rub.   No murmur heard. Pulmonary/Chest: Effort normal and breath sounds normal. No stridor. No respiratory distress. He has no wheezes. He has no rales. He exhibits no tenderness.  Abdominal: Soft. Bowel sounds are normal. He exhibits no distension and no mass. There is no tenderness. There is no rebound and no guarding.  Genitourinary: Rectal exam shows external hemorrhoid. Prostate is enlarged. Prostate is not tender.  Musculoskeletal: Normal range of motion. He exhibits no edema, tenderness or deformity.  Lymphadenopathy:    He has no cervical adenopathy.  Neurological: He is alert and oriented to person, place, and time. He has normal reflexes. He displays normal reflexes. No cranial nerve deficit. He exhibits normal muscle tone. Coordination normal.  Skin: Skin is warm and dry. No rash noted. He is not  diaphoretic. No erythema. No pallor.  Psychiatric: He has a normal mood and affect. His behavior is normal. Judgment and thought content normal.  Nursing note and vitals reviewed.     Assessment & Plan:  1. Routine general medical examination at a health care facility - Reviewed labs in detail with patient and all questions answered. - Would like him to start exercising more often as well as following a diabetic diet - Follow-up in 1 year for next CPE or sooner for any acute issues  2. Essential hypertension - near goal. No change in medication at this time.  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 I will increase metformin from 500 mg twice a day to 1000 mg twice a day - metFORMIN (GLUCOPHAGE) 1000 MG tablet; Take 1 tablet (1,000  mg total) by mouth 2 (two) times daily with a meal.  Dispense: 180 tablet; Refill: 3 - Educated on the importance of a diabetic diet as well as frequent aerobic exercise. - Follow up in 3 months   4 Colon cancer screening  - Ambulatory referral to Gastroenterology  5. Need for 23-polyvalent pneumococcal polysaccharide vaccine  - Pneumococcal polysaccharide vaccine 23-valent greater than or equal to 2yo subcutaneous/IM   Shirline Freesory Armenta Erskin, NP

## 2016-02-03 ENCOUNTER — Encounter: Payer: Self-pay | Admitting: Adult Health

## 2016-02-08 ENCOUNTER — Other Ambulatory Visit: Payer: Self-pay | Admitting: Family Medicine

## 2016-02-17 ENCOUNTER — Other Ambulatory Visit: Payer: Self-pay | Admitting: Family Medicine

## 2016-02-21 ENCOUNTER — Telehealth: Payer: Self-pay | Admitting: Family Medicine

## 2016-02-21 ENCOUNTER — Other Ambulatory Visit: Payer: Self-pay

## 2016-02-21 MED ORDER — SIMVASTATIN 10 MG PO TABS: 10.0000 mg | ORAL_TABLET | Freq: Every day | ORAL | 0 refills | Status: DC

## 2016-02-21 NOTE — Telephone Encounter (Signed)
OK to refill

## 2016-02-21 NOTE — Telephone Encounter (Signed)
Rx has been refilled.  

## 2016-02-21 NOTE — Telephone Encounter (Signed)
Pt needs a refill on simvastatin send to [facility] [address] [facility]

## 2016-02-21 NOTE — Telephone Encounter (Signed)
Ok to refill for one year  

## 2016-02-29 ENCOUNTER — Telehealth: Payer: Self-pay | Admitting: Family Medicine

## 2016-02-29 NOTE — Telephone Encounter (Signed)
Pt request refill  VIAGRA 50 MG tablet  [facility]/ [address]

## 2016-02-29 NOTE — Telephone Encounter (Signed)
Ok to refill 

## 2016-03-01 ENCOUNTER — Telehealth: Payer: Self-pay

## 2016-03-01 ENCOUNTER — Other Ambulatory Visit: Payer: Self-pay

## 2016-03-01 MED ORDER — SILDENAFIL CITRATE 50 MG PO TABS: ORAL_TABLET | ORAL | 0 refills | Status: DC

## 2016-03-01 NOTE — Telephone Encounter (Signed)
PA denied, paperwork is on its way. Medication is not covered by insurance.

## 2016-03-01 NOTE — Telephone Encounter (Signed)
Rx has been sent in. 

## 2016-03-01 NOTE — Telephone Encounter (Signed)
Received PA request for Sildenafil 50 mg tablets. PA submitted via form from insurance company.

## 2016-03-01 NOTE — Telephone Encounter (Signed)
Ok to refill 

## 2016-03-02 ENCOUNTER — Other Ambulatory Visit: Payer: Self-pay

## 2016-03-02 NOTE — Telephone Encounter (Signed)
Pt was hoping to get refills on this Rx.  Pt had CPE with Kandee Keenory in November this year.

## 2016-03-02 NOTE — Telephone Encounter (Signed)
Ok to send 11 refills

## 2016-03-02 NOTE — Telephone Encounter (Signed)
Rx was sent in, but with no refills. Ok to resend with refills?

## 2016-03-02 NOTE — Telephone Encounter (Signed)
I called and spoke with pharmacist, and stated that Per Kandee Keenory, it is okay for patient to have 11 refills on this medication. Refills have been added. Thanks!

## 2016-03-02 NOTE — Telephone Encounter (Signed)
Pt states he is going to pay for out of pocket for this rx.

## 2016-03-03 ENCOUNTER — Encounter: Payer: Self-pay | Admitting: Internal Medicine

## 2016-03-03 ENCOUNTER — Ambulatory Visit (INDEPENDENT_AMBULATORY_CARE_PROVIDER_SITE_OTHER): Payer: BC Managed Care – PPO | Admitting: Internal Medicine

## 2016-03-03 ENCOUNTER — Encounter (INDEPENDENT_AMBULATORY_CARE_PROVIDER_SITE_OTHER): Payer: Self-pay

## 2016-03-03 VITALS — BP 140/78 | HR 76 | Temp 98.4°F | Resp 20 | Wt 238.0 lb

## 2016-03-03 DIAGNOSIS — E109 Type 1 diabetes mellitus without complications: Secondary | ICD-10-CM

## 2016-03-03 DIAGNOSIS — I10 Essential (primary) hypertension: Secondary | ICD-10-CM

## 2016-03-03 DIAGNOSIS — J019 Acute sinusitis, unspecified: Secondary | ICD-10-CM | POA: Diagnosis not present

## 2016-03-03 DIAGNOSIS — E139 Other specified diabetes mellitus without complications: Secondary | ICD-10-CM

## 2016-03-03 MED ORDER — HYDROCODONE-HOMATROPINE 5-1.5 MG/5ML PO SYRP: 5.0000 mL | ORAL_SOLUTION | Freq: Four times a day (QID) | ORAL | 0 refills | Status: AC | PRN

## 2016-03-03 MED ORDER — AZITHROMYCIN 250 MG PO TABS: ORAL_TABLET | ORAL | 1 refills | Status: DC

## 2016-03-03 NOTE — Patient Instructions (Signed)
Please take all new medication as prescribed - the antibiotic and cough medicine if needed  Please continue all other medications as before, and refills have been done if requested.  Please have the pharmacy call with any other refills you may need.  Please keep your appointments with your specialists as you may have planned    

## 2016-03-03 NOTE — Progress Notes (Signed)
Subjective:    Patient ID: Mark Cunasharles Edward Artus Sr., male    DOB: 03/19/1950, 65 y.o.   MRN: [id number]  HPI  Here with 2-3 days acute onset fever, facial pain, pressure, headache, general weakness and malaise, and greenish d/c, with mild ST and cough, but pt denies chest pain, wheezing, increased sob or doe, orthopnea, PND, increased LE swelling, palpitations, dizziness or syncope.   Pt denies polydipsia, polyuria, No other new history findings Past Medical History:  Diagnosis Date  . Diabetes type 2, controlled (HCC)   . Hyperlipidemia   . Hypertension    Past Surgical History:  Procedure Laterality Date  . MULTIPLE TOOTH EXTRACTIONS      reports that he has never smoked. He has never used smokeless tobacco. He reports that he does not drink alcohol or use drugs. family history includes Diabetes in his other; Kidney disease in his other. No Known Allergies Current Outpatient Prescriptions on File Prior to Visit  Medication Sig Dispense Refill  . glipiZIDE (GLUCOTROL) 10 MG tablet TAKE 1 TABLET BY MOUTH TWICE DAILY BEFORE A MEAL 180 tablet 0  . glipiZIDE (GLUCOTROL) 10 MG tablet TAKE 1 TABLET BY MOUTH TWICE DAILY BEFORE A MEAL (Patient not taking: Reported on 02/02/2016) 180 tablet 0  . hydrochlorothiazide (HYDRODIURIL) 25 MG tablet TAKE 1 TABLET BY MOUTH EVERY DAY 100 tablet 1  . hydrochlorothiazide (HYDRODIURIL) 25 MG tablet TAKE 1 TABLET BY MOUTH EVERY DAY (Patient not taking: Reported on 02/02/2016) 90 tablet 0  . hydrochlorothiazide (HYDRODIURIL) 25 MG tablet TAKE 1 TABLET BY MOUTH DAILY (Patient not taking: Reported on 02/02/2016) 30 tablet 0  . hydrochlorothiazide (HYDRODIURIL) 25 MG tablet TAKE 1 TABLET BY MOUTH DAILY (Patient not taking: Reported on 02/02/2016) 100 tablet 0  . metFORMIN (GLUCOPHAGE) 1000 MG tablet Take 1 tablet (1,000 mg total) by mouth 2 (two) times daily with a meal. 180 tablet 3  . metoprolol succinate (TOPROL-XL) 100 MG 24 hr tablet TAKE 1 TABLET BY MOUTH  EVERY DAY 90 tablet 0  . ramipril (ALTACE) 10 MG capsule TAKE 2 CAPSULES BY MOUTH EVERY MORNING 180 capsule 0  . sildenafil (REVATIO) 20 MG tablet TAKE 1 TABLET(20 MG) BY MOUTH DAILY (Patient not taking: Reported on 02/02/2016) 90 tablet 3  . sildenafil (VIAGRA) 50 MG tablet TAKE 1 TABLET BY MOUTH AS NEEDED FOR ERECTILE DYSFUNCTION 6 tablet 0  . simvastatin (ZOCOR) 10 MG tablet TAKE 1 TABLET BY MOUTH AT BEDTIME (Patient not taking: Reported on 02/02/2016) 100 tablet 0  . simvastatin (ZOCOR) 10 MG tablet Take 1 tablet (10 mg total) by mouth at bedtime. 100 tablet 0   No current facility-administered medications on file prior to visit.    Review of Systems  Constitutional: Negative for unusual diaphoresis or night sweats HENT: Negative for ear swelling or discharge Eyes: Negative for worsening visual haziness  Respiratory: Negative for choking and stridor.   Gastrointestinal: Negative for distension or worsening eructation Genitourinary: Negative for retention or change in urine volume.  Musculoskeletal: Negative for other MSK pain or swelling Skin: Negative for color change and worsening wound Neurological: Negative for tremors and numbness other than noted  Psychiatric/Behavioral: Negative for decreased concentration or agitation other than above   All other system neg per pt    Objective:   Physical Exam BP 140/78   Pulse 76   Temp 98.4 F (36.9 C) (Oral)   Resp 20   Wt 238 lb (108 kg)   SpO2 94%   BMI 28.97  kg/m  VS noted,  Constitutional: Pt appears in no apparent distress HENT: Head: NCAT.  Right Ear: External ear normal.  Left Ear: External ear normal.  Bilat tm's with mild erythema.  Max sinus areas mild tender.  Pharynx with mild erythema, no exudate Eyes: . Pupils are equal, round, and reactive to light. Conjunctivae and EOM are normal Neck: Normal range of motion. Neck supple.  Cardiovascular: Normal rate and regular rhythm.   Pulmonary/Chest: Effort normal and  breath sounds without rales or wheezing.  Neurological: Pt is alert. Not confused , motor grossly intact Skin: Skin is warm. No rash, no LE edema Psychiatric: Pt behavior is normal. No agitation.  No other new exam findings    Assessment & Plan:

## 2016-03-03 NOTE — Progress Notes (Signed)
Pre visit review using our clinic review tool, if applicable. No additional management support is needed unless otherwise documented below in the visit note. 

## 2016-03-04 NOTE — Assessment & Plan Note (Signed)
stable overall by history and exam, recent data reviewed with pt, and pt to continue medical treatment as before,  to f/u any worsening symptoms or concerns Lab Results  Component Value Date   HGBA1C 7.1 (H) 01/25/2016

## 2016-03-04 NOTE — Assessment & Plan Note (Addendum)
stable overall by history and exam, recent data reviewed with pt, and pt to continue medical treatment as before,  to f/u any worsening symptoms or concerns BP Readings from Last 3 Encounters:  03/03/16 140/78  02/02/16 132/84  09/01/14 124/78

## 2016-03-06 ENCOUNTER — Encounter: Payer: Self-pay | Admitting: Adult Health

## 2016-04-02 ENCOUNTER — Other Ambulatory Visit: Payer: Self-pay | Admitting: Family Medicine

## 2016-04-18 ENCOUNTER — Other Ambulatory Visit: Payer: Self-pay

## 2016-04-18 ENCOUNTER — Telehealth: Payer: Self-pay | Admitting: Family Medicine

## 2016-04-18 MED ORDER — GLIPIZIDE 10 MG PO TABS: ORAL_TABLET | ORAL | 0 refills | Status: DC

## 2016-04-18 MED ORDER — HYDROCHLOROTHIAZIDE 25 MG PO TABS: 25.0000 mg | ORAL_TABLET | Freq: Every day | ORAL | 0 refills | Status: DC

## 2016-04-18 MED ORDER — METOPROLOL SUCCINATE ER 100 MG PO TB24: 100.0000 mg | ORAL_TABLET | Freq: Every day | ORAL | 0 refills | Status: DC

## 2016-04-18 NOTE — Telephone Encounter (Signed)
Prescriptions have been sent into the pharmacy. 

## 2016-04-18 NOTE — Telephone Encounter (Signed)
Pt request refill  glipiZIDE (GLUCOTROL) 10 MG tablet hydrochlorothiazide (HYDRODIURIL) 25 MG tabletPt request refill  metoprolol succinate (TOPROL-XL) 100 MG 24 hr tablet    [id number] - [place], [place] - [address]

## 2016-04-19 ENCOUNTER — Other Ambulatory Visit: Payer: Self-pay | Admitting: Family Medicine

## 2016-04-19 ENCOUNTER — Telehealth: Payer: Self-pay | Admitting: Adult Health

## 2016-04-19 NOTE — Telephone Encounter (Signed)
Rx has been refilled.  

## 2016-04-19 NOTE — Telephone Encounter (Signed)
° ° ° °  Pt request refill of the following:  ramipril (ALTACE) 10 MG capsule   Phamacy:  [facility] [address]

## 2016-05-03 ENCOUNTER — Ambulatory Visit: Payer: BC Managed Care – PPO | Admitting: Adult Health

## 2016-05-30 ENCOUNTER — Ambulatory Visit: Payer: BC Managed Care – PPO | Admitting: Adult Health

## 2016-06-01 ENCOUNTER — Other Ambulatory Visit: Payer: Self-pay | Admitting: Adult Health

## 2016-06-04 NOTE — Telephone Encounter (Signed)
Pt is out of simvastatin

## 2016-06-07 ENCOUNTER — Ambulatory Visit: Payer: BC Managed Care – PPO | Admitting: Adult Health

## 2016-06-21 ENCOUNTER — Ambulatory Visit (INDEPENDENT_AMBULATORY_CARE_PROVIDER_SITE_OTHER): Payer: Self-pay

## 2016-06-21 ENCOUNTER — Encounter (INDEPENDENT_AMBULATORY_CARE_PROVIDER_SITE_OTHER): Payer: Self-pay | Admitting: Orthopaedic Surgery

## 2016-06-21 ENCOUNTER — Encounter: Payer: Self-pay | Admitting: Adult Health

## 2016-06-21 ENCOUNTER — Ambulatory Visit (INDEPENDENT_AMBULATORY_CARE_PROVIDER_SITE_OTHER): Payer: BC Managed Care – PPO | Admitting: Adult Health

## 2016-06-21 ENCOUNTER — Ambulatory Visit (INDEPENDENT_AMBULATORY_CARE_PROVIDER_SITE_OTHER): Payer: BC Managed Care – PPO | Admitting: Orthopaedic Surgery

## 2016-06-21 VITALS — BP 134/60 | Temp 97.4°F | Wt 243.2 lb

## 2016-06-21 VITALS — BP 153/78 | HR 72 | Resp 14 | Ht 77.0 in | Wt 255.0 lb

## 2016-06-21 DIAGNOSIS — E109 Type 1 diabetes mellitus without complications: Secondary | ICD-10-CM | POA: Diagnosis not present

## 2016-06-21 DIAGNOSIS — M25561 Pain in right knee: Secondary | ICD-10-CM | POA: Diagnosis not present

## 2016-06-21 DIAGNOSIS — G8929 Other chronic pain: Secondary | ICD-10-CM | POA: Diagnosis not present

## 2016-06-21 DIAGNOSIS — E139 Other specified diabetes mellitus without complications: Secondary | ICD-10-CM

## 2016-06-21 LAB — POCT GLYCOSYLATED HEMOGLOBIN (HGB A1C): Hemoglobin A1C: 6.5

## 2016-06-21 NOTE — Progress Notes (Signed)
Office Visit Note   Patient: Mark Nosal Sr.           Date of Birth: 08/13/1950           MRN: [id number] Visit Date: 06/21/2016              Requested by: Shirline Frees, NP [address] [place], [place] [address] PCP: Shirline Frees, NP   Assessment & Plan: Visit Diagnoses:  1. Chronic pain of right knee   Tricompartmental osteoarthritis  Discussion regarding treatment options. We'll give samples of Pennsaid and discuss appropriate over-the-counter anti-inflammatory medicines. We might want to consider cortisone injection and Visco supplementation at some point in the future. Mark Ali did have an MRI scan of the same knee in 2015 demonstrating a tear of the medial meniscus. I'm more inclined to believe that today that his problem is related to the arthritis   Orders:  Orders Placed This Encounter  Procedures  . XR KNEE 3 VIEW RIGHT   No orders of the defined types were placed in this encounter.     Procedures: No procedures performed   Clinical Data: No additional findings.   Subjective: Chief Complaint  Patient presents with  . Right Knee - Pain, Edema    Mark Ali is a 66 y o male that presents with chronic R knee pain. MRI 2015, has become increasingly worse lately especially when driving (side to side) motion and up and down stairs.Denies injury, numbness, tingling but edema in R knee and R ankle.  Mark Ali was last seen in 2015 . He had an MRI scan of his right knee demonstrating an oblique under surface tear of the body and posterior horn of the medial meniscus. Treatment was nonoperative. He's done well until recently where he "more trouble with pain along the medial compartment and the patellofemoral joint where he has difficulty bending stooping squatting and maneuvering inclines. He denies any injury or trauma  HPI  Review of Systems   Objective: Vital Signs: BP (!) 153/78   Pulse 72   Resp 14   Ht  (1.956 m)   Wt 255 lb  (115.7 kg)   BMI 30.24 kg/m   Physical Exam  Ortho Exam right knee exam with full extension. Flexes over 105 without instability. Mild increased varus with mild medial joint pain. No popping or clicking. Mild patellar crepitation without effusion pain. No popliteal mass. Neurovascular exam intact distally  Specialty Comments:  No specialty comments available.  Imaging: Xr Knee 3 View Right  Result Date: 06/21/2016 Service the right knee were obtained in 3 projections standing. There are obvious tricompartmental degenerative changes predominantly in the patellofemoral joint and medial compartment. There is approximately 3 of varus with narrowing of the medial joint space on the lateral view there is a lot of irregularity beneath the patella with the proximal and distal osteophytes ., all the above consistent with progressive osteoarthritis.    PMFS History: Patient Active Problem List   Diagnosis Date Noted  . Acute sinus infection 03/03/2016  . Erectile dysfunction 09/01/2014  . ANEMIA CHRONIC DISEASE OTHER 07/14/2009  . UNSPECIFIED TACHYCARDIA 05/06/2007  . Diabetes 1.5, managed as type 2 (HCC) 11/25/2006  . Essential hypertension 11/25/2006   Past Medical History:  Diagnosis Date  . Diabetes type 2, controlled (HCC)   . Hyperlipidemia   . Hypertension     Family History  Problem Relation Age of Onset  . Diabetes Other   . Kidney disease Other  Past Surgical History:  Procedure Laterality Date  . MULTIPLE TOOTH EXTRACTIONS     Social History   Occupational History  . Not on file.   Social History Main Topics  . Smoking status: Never Smoker  . Smokeless tobacco: Never Used  . Alcohol use No  . Drug use: No  . Sexual activity: Not on file

## 2016-06-21 NOTE — Progress Notes (Signed)
Subjective:    Patient ID: [name], male    DOB: 09-20-1950, 66 y.o.   MRN: [id number]  HPI   66 year old male who  has a past medical history of Diabetes type 2, controlled (HCC); Hyperlipidemia; and Hypertension. He presents to the office for 4 month follow up regarding diabetes mellitus. His last A1c was 7.1. At that time, Metformin was increased to  BID and he was continued on Glipizide.   Today in the office he reports that he is taking his medications as directed and has made dietary changes. He continues to exercise.   Review of Systems See HPI   Past Medical History:  Diagnosis Date  . Diabetes type 2, controlled (HCC)   . Hyperlipidemia   . Hypertension     Social History   Social History  . Marital status: Married    Spouse name: N/A  . Number of children: N/A  . Years of education: N/A   Occupational History  . Not on file.   Social History Main Topics  . Smoking status: Never Smoker  . Smokeless tobacco: Never Used  . Alcohol use No  . Drug use: No  . Sexual activity: Not on file   Other Topics Concern  . Not on file   Social History Narrative  . No narrative on file    Past Surgical History:  Procedure Laterality Date  . MULTIPLE TOOTH EXTRACTIONS      Family History  Problem Relation Age of Onset  . Diabetes Other   . Kidney disease Other     No Known Allergies  Current Outpatient Prescriptions on File Prior to Visit  Medication Sig Dispense Refill  . azithromycin (ZITHROMAX Z-PAK) 250 MG tablet 2 tab by mouth day 1, then 1 per day (Patient not taking: Reported on 06/21/2016) 6 tablet 1  . glipiZIDE (GLUCOTROL) 10 MG tablet TAKE 1 TABLET BY MOUTH TWICE DAILY BEFORE A MEAL 180 tablet 0  . hydrochlorothiazide (HYDRODIURIL) 25 MG tablet TAKE 1 TABLET BY MOUTH EVERY DAY 100 tablet 1  . metFORMIN (GLUCOPHAGE) 1000 MG tablet Take 1 tablet (1,000 mg total) by mouth 2 (two) times daily with a meal. 180 tablet 3  .  metoprolol succinate (TOPROL-XL) 100 MG 24 hr tablet Take 1 tablet (100 mg total) by mouth daily. Take with or immediately following a meal. 90 tablet 0  . ramipril (ALTACE) 10 MG capsule TAKE 2 CAPSULES BY MOUTH EVERY MORNING 180 capsule 0  . sildenafil (REVATIO) 20 MG tablet TAKE 1 TABLET(20 MG) BY MOUTH DAILY 90 tablet 3  . sildenafil (VIAGRA) 50 MG tablet TAKE 1 TABLET BY MOUTH AS NEEDED FOR ERECTILE DYSFUNCTION (Patient not taking: Reported on 06/21/2016) 6 tablet 0  . simvastatin (ZOCOR) 10 MG tablet TAKE 1 TABLET BY MOUTH AT BEDTIME (Patient not taking: Reported on 02/02/2016) 100 tablet 0   No current facility-administered medications on file prior to visit.     BP 134/60 (BP Location: Left Arm, Patient Position: Sitting, Cuff Size: Large)   Temp 97.4 F (36.3 C) (Oral)   Wt 243 lb 3.2 oz (110.3 kg)   BMI 28.84 kg/m       Objective:   Physical Exam  Constitutional: He is oriented to person, place, and time. He appears well-developed and well-nourished. No distress.  Cardiovascular: Normal rate, regular rhythm, normal heart sounds and intact distal pulses.  Exam reveals no gallop and no friction rub.   No murmur heard. Pulmonary/Chest: Effort  normal and breath sounds normal. No respiratory distress. He has no wheezes. He has no rales. He exhibits no tenderness.  Neurological: He is oriented to person, place, and time.  Skin: Skin is warm and dry. No rash noted. He is not diaphoretic. No erythema. No pallor.  Psychiatric: He has a normal mood and affect. His behavior is normal. Judgment and thought content normal.  Nursing note and vitals reviewed.     Assessment & Plan:  1. Diabetes 1.5, managed as type 2 (HCC) - POC HgB A1c- 6.5 - A1c has improved.  - Continue with diet and exercise  - Will follow up with at his CPE  Shirline Frees, NP

## 2016-06-22 ENCOUNTER — Ambulatory Visit (INDEPENDENT_AMBULATORY_CARE_PROVIDER_SITE_OTHER): Payer: Self-pay | Admitting: Orthopaedic Surgery

## 2016-07-12 ENCOUNTER — Other Ambulatory Visit: Payer: Self-pay | Admitting: Adult Health

## 2016-07-13 NOTE — Telephone Encounter (Signed)
Ok to refill 90 +1  

## 2016-08-16 ENCOUNTER — Other Ambulatory Visit: Payer: Self-pay | Admitting: Adult Health

## 2016-08-16 ENCOUNTER — Telehealth: Payer: Self-pay | Admitting: Adult Health

## 2016-08-16 NOTE — Telephone Encounter (Signed)
Ok to refill 

## 2016-08-16 NOTE — Telephone Encounter (Signed)
Pt needs a refill on viagra [facility] [address]

## 2016-08-17 NOTE — Telephone Encounter (Signed)
Ok to refill + 11

## 2016-10-30 ENCOUNTER — Telehealth: Payer: Self-pay | Admitting: Adult Health

## 2016-10-30 ENCOUNTER — Ambulatory Visit (INDEPENDENT_AMBULATORY_CARE_PROVIDER_SITE_OTHER): Payer: Medicare Other | Admitting: Adult Health

## 2016-10-30 ENCOUNTER — Encounter: Payer: Self-pay | Admitting: Adult Health

## 2016-10-30 VITALS — BP 128/74 | Temp 97.9°F | Ht 77.0 in | Wt 236.0 lb

## 2016-10-30 DIAGNOSIS — S91102A Unspecified open wound of left great toe without damage to nail, initial encounter: Secondary | ICD-10-CM | POA: Diagnosis not present

## 2016-10-30 NOTE — Telephone Encounter (Signed)
Yes thanks, fine with me

## 2016-10-30 NOTE — Telephone Encounter (Signed)
Pt wife sees dr Therapist, nutritional and he would like to switch to dr Therapist, nutritional and move to [place] location. Can I sch?

## 2016-10-30 NOTE — Telephone Encounter (Signed)
That is ok with me  

## 2016-10-30 NOTE — Progress Notes (Signed)
Subjective:    Patient ID: Mark Cunas Sr., male    DOB: 02-Jun-1950, 66 y.o.   MRN: [id number]  HPI  66 year old male who  has a past medical history of Diabetes type 2, controlled (HCC); Hyperlipidemia; and Hypertension. He presents to the office today for an acute issue of wound to left great toe. He reports that he was " clipping some of the dead skin off and I went too deep.". He has been keeping the area clean and dry. He has been using neosporin. Denies any signs or symptoms of infection     Review of Systems See HPI   Past Medical History:  Diagnosis Date  . Diabetes type 2, controlled (HCC)   . Hyperlipidemia   . Hypertension     Social History   Social History  . Marital status: Married    Spouse name: N/A  . Number of children: N/A  . Years of education: N/A   Occupational History  . Not on file.   Social History Main Topics  . Smoking status: Never Smoker  . Smokeless tobacco: Never Used  . Alcohol use No  . Drug use: No  . Sexual activity: Not on file   Other Topics Concern  . Not on file   Social History Narrative  . No narrative on file    Past Surgical History:  Procedure Laterality Date  . MULTIPLE TOOTH EXTRACTIONS      Family History  Problem Relation Age of Onset  . Diabetes Other   . Kidney disease Other     No Known Allergies  Current Outpatient Prescriptions on File Prior to Visit  Medication Sig Dispense Refill  . azithromycin (ZITHROMAX Z-PAK) 250 MG tablet 2 tab by mouth day 1, then 1 per day 6 tablet 1  . glipiZIDE (GLUCOTROL) 10 MG tablet TAKE 1 TABLET BY MOUTH TWICE DAILY BEFORE A MEAL 180 tablet 0  . hydrochlorothiazide (HYDRODIURIL) 25 MG tablet TAKE 1 TABLET(25 MG) BY MOUTH DAILY 90 tablet 1  . metFORMIN (GLUCOPHAGE) 1000 MG tablet Take 1 tablet (1,000 mg total) by mouth 2 (two) times daily with a meal. 180 tablet 3  . metoprolol succinate (TOPROL-XL) 100 MG 24 hr tablet TAKE 1 TABLET BY MOUTH DAILY. TAKE WITH  OR IMMEDIATELY FOLLOWING A MEAL 90 tablet 1  . ramipril (ALTACE) 10 MG capsule TAKE 2 CAPSULES BY MOUTH EVERY MORNING 180 capsule 1  . sildenafil (VIAGRA) 50 MG tablet TAKE 1 TABLET BY MOUTH AS NEEDED FOR ERECTILE DYSFUNCTION 6 tablet 0  . simvastatin (ZOCOR) 10 MG tablet TAKE 1 TABLET BY MOUTH AT BEDTIME 100 tablet 0   No current facility-administered medications on file prior to visit.     BP 128/74 (BP Location: Right Arm)   Temp 97.9 F (36.6 C) (Oral)   Ht 6\' 5"  (1.956 m)   Wt 236 lb (107 kg)   BMI 27.99 kg/m       Objective:   Physical Exam  Constitutional: He is oriented to person, place, and time. He appears well-developed and well-nourished. No distress.  Cardiovascular: Normal rate, regular rhythm, normal heart sounds and intact distal pulses.  Exam reveals no gallop.   No murmur heard. Pulmonary/Chest: Effort normal and breath sounds normal. No respiratory distress. He has no wheezes. He has no rales. He exhibits no tenderness.  Neurological: He is alert and oriented to person, place, and time.  Skin: Skin is warm and dry. No rash noted. He is not  diaphoretic. No erythema. No pallor.  Well healed wound to pad of left great toe. No signs of infection. He has significant calloused skin to that area.   Psychiatric: He has a normal mood and affect. His behavior is normal. Judgment and thought content normal.  Nursing note and vitals reviewed.     Assessment & Plan:  1. Open wound of left great toe, initial encounter - No signs of infection. Advised to monitor wound and inform me if he develops infection. No need for abx at this time. Will refer to podiatry for wound care  - Ambulatory referral to Podiatry  Shirline Frees, NP

## 2016-11-06 NOTE — Telephone Encounter (Signed)
Pt is aware new pcp dr hunter will callback to sch an appt when needed

## 2016-11-09 ENCOUNTER — Telehealth: Payer: Self-pay | Admitting: Adult Health

## 2016-11-09 MED ORDER — SILDENAFIL CITRATE 50 MG PO TABS: ORAL_TABLET | ORAL | 1 refills | Status: DC

## 2016-11-09 NOTE — Telephone Encounter (Signed)
Pt request refill  sildenafil (VIAGRA) 50 MG tablet   [facility] [id number] - [place], [place] - [address]

## 2016-11-09 NOTE — Telephone Encounter (Signed)
Sent to the pharmacy by e-scribe. 

## 2016-11-19 ENCOUNTER — Encounter: Payer: Self-pay | Admitting: Podiatry

## 2016-11-19 ENCOUNTER — Ambulatory Visit (INDEPENDENT_AMBULATORY_CARE_PROVIDER_SITE_OTHER): Payer: Medicare Other | Admitting: Podiatry

## 2016-11-19 ENCOUNTER — Ambulatory Visit (INDEPENDENT_AMBULATORY_CARE_PROVIDER_SITE_OTHER): Payer: Medicare Other

## 2016-11-19 DIAGNOSIS — M659 Synovitis and tenosynovitis, unspecified: Secondary | ICD-10-CM

## 2016-11-19 DIAGNOSIS — M21612 Bunion of left foot: Secondary | ICD-10-CM

## 2016-11-19 DIAGNOSIS — M2042 Other hammer toe(s) (acquired), left foot: Secondary | ICD-10-CM | POA: Diagnosis not present

## 2016-11-19 DIAGNOSIS — M25571 Pain in right ankle and joints of right foot: Secondary | ICD-10-CM

## 2016-11-19 DIAGNOSIS — L84 Corns and callosities: Secondary | ICD-10-CM | POA: Diagnosis not present

## 2016-11-19 NOTE — Progress Notes (Signed)
   Subjective:    Patient ID: Mark Cunas Sr., male    DOB: [birth year], 66 y.o.   MRN: [id number]  HPI    Review of Systems  Musculoskeletal: Positive for arthralgias.  All other systems reviewed and are negative.      Objective:   Physical Exam        Assessment & Plan:

## 2016-11-19 NOTE — Progress Notes (Signed)
Patient ID: Mark Cunas Sr., male   DOB: Feb 17, 1951, 66 y.o.   MRN: [id number]   Subjective: 66 year old male presents the office today for multiple complaints. The patient's main complaint is regarding a symptomatic callus lesion to the plantar aspect of the left foot. Painful during ambulation. Patient also complains of pain and tenderness to the right ankle. He states that he does have a history of recurrent ankle sprains. 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 Finally the patient complains of bunion hammertoe deformity to the left foot. He states that it is been present for several years and does not currently bother him and is not symptomatic.  Past Medical History:  Diagnosis Date  . Diabetes type 2, controlled (HCC)   . Hyperlipidemia   . Hypertension     Objective: Physical Exam General: The patient is alert and oriented x3 in no acute distress.  Dermatology: Hyperkeratotic callus lesion noted to the plantar aspect of the left foot 2 Skin is cool, dry and supple bilateral lower extremities. Negative for open lesions or macerations.  Vascular: Palpable pedal pulses bilaterally. No edema or erythema noted. Capillary refill within normal limits.  Neurological: Epicritic and protective threshold grossly intact bilaterally.   Musculoskeletal Exam: Clinical evidence of bunion deformity noted to the respective foot. There is a moderate pain on palpation range of motion of the first MPJ. Lateral deviation of the hallux noted consistent with hallux abductovalgus. Hammertoe contracture also noted on clinical exam to second digit left foot. Symptomatic pain on palpation and range of motion also noted to the metatarsal phalangeal joints of the respective hammertoe digits.  There is some mild pain on palpation noted to the medial aspect of the patient's right ankle joint   Radiographic Exam:  Increased intermetatarsal angle greater than 15 with a hallux abductus angle greater than 30 noted on AP view. Moderate degenerative changes noted within the first MPJ. Contracture deformity also noted to the interphalangeal joints and MPJs of the digits of the respective hammertoes.    Assessment: 1. 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 Hammertoe deformity left lower extremity 3. Pre-ulcerative callus lesion left foot 2 4. Posttraumatic ankle synovitis right   Plan of Care:  1. Patient was evaluated. X-rays reviewed today 2. Excisional debridement of the callus lesions was performed using a chisel blade without incident or bleeding 3. Discussed the treatment options regarding right ankle pain. At the moment the right ankle is somewhat nonsymptomatic. The patient would rather not receive an injection today 4. Today we discussed surgical management of bunion and hammertoe deformity. The patient will opted for conservative management including appropriate shoe gear and arch supports. 5. Return to clinic when necessary   Felecia Shelling, DPM [facility]  Dr. Felecia Shelling, DPM    [address]                                        [place], [place] [address]                Office [phone]  Fax [phone]

## 2016-11-25 ENCOUNTER — Other Ambulatory Visit: Payer: Self-pay | Admitting: Adult Health

## 2016-11-28 NOTE — Telephone Encounter (Signed)
Last filled 10/2015, #100 by Dr. Tawanna Cooler. Please advise refill. Lab Results  Component Value Date   CHOL 125 01/25/2016   HDL 39.30 01/25/2016   LDLCALC 67 01/25/2016   LDLDIRECT 123.6 03/01/2008   TRIG 95.0 01/25/2016   CHOLHDL 3 01/25/2016

## 2016-11-28 NOTE — Telephone Encounter (Signed)
Ok to refill for 90+3 

## 2016-11-28 NOTE — Telephone Encounter (Signed)
Medication filled to pharmacy as requested.   

## 2016-12-16 ENCOUNTER — Other Ambulatory Visit: Payer: Self-pay | Admitting: Adult Health

## 2016-12-16 DIAGNOSIS — N529 Male erectile dysfunction, unspecified: Secondary | ICD-10-CM

## 2016-12-18 NOTE — Telephone Encounter (Signed)
Cory, 50 mg tabs are on the medication list.  Refill request is for 20 mg and to take 1 daily.  Shouldn't it say 1-5 tabs as needed.  Please advise.

## 2016-12-19 NOTE — Telephone Encounter (Signed)
Yes, take 1-5 tab as needed.

## 2016-12-19 NOTE — Telephone Encounter (Signed)
Sent to the pharmacy by e-scribe. 

## 2016-12-31 ENCOUNTER — Other Ambulatory Visit: Payer: Self-pay | Admitting: Adult Health

## 2017-01-02 NOTE — Telephone Encounter (Signed)
Asher MuirJamie,  Pt changed PCP in August but has not scheduled to see Dr. Durene CalHunter.  See note from 10/30/16.  Says to schedule with Dr. Durene CalHunter as needed.

## 2017-01-28 ENCOUNTER — Other Ambulatory Visit: Payer: Self-pay | Admitting: Adult Health

## 2017-01-28 DIAGNOSIS — E139 Other specified diabetes mellitus without complications: Secondary | ICD-10-CM

## 2017-01-29 NOTE — Telephone Encounter (Signed)
Pt changing to Dr. Durene CalHunter.  See telephone note from 10/30/16.

## 2017-01-30 NOTE — Telephone Encounter (Signed)
Called and spoke to patient. He has a 30 day supply at this time and I scheduled him for an appointment with Dr. Durene CalHunter on 02/21/17.

## 2017-02-21 ENCOUNTER — Encounter: Payer: Self-pay | Admitting: Family Medicine

## 2017-02-21 ENCOUNTER — Ambulatory Visit (INDEPENDENT_AMBULATORY_CARE_PROVIDER_SITE_OTHER): Payer: Medicare Other | Admitting: Family Medicine

## 2017-02-21 VITALS — BP 136/78 | HR 74 | Temp 98.2°F | Ht 77.0 in | Wt 239.4 lb

## 2017-02-21 DIAGNOSIS — Z1159 Encounter for screening for other viral diseases: Secondary | ICD-10-CM | POA: Diagnosis not present

## 2017-02-21 DIAGNOSIS — E785 Hyperlipidemia, unspecified: Secondary | ICD-10-CM | POA: Diagnosis not present

## 2017-02-21 DIAGNOSIS — Z125 Encounter for screening for malignant neoplasm of prostate: Secondary | ICD-10-CM

## 2017-02-21 DIAGNOSIS — D638 Anemia in other chronic diseases classified elsewhere: Secondary | ICD-10-CM

## 2017-02-21 DIAGNOSIS — I10 Essential (primary) hypertension: Secondary | ICD-10-CM

## 2017-02-21 DIAGNOSIS — Z23 Encounter for immunization: Secondary | ICD-10-CM | POA: Diagnosis not present

## 2017-02-21 DIAGNOSIS — E1169 Type 2 diabetes mellitus with other specified complication: Secondary | ICD-10-CM | POA: Diagnosis not present

## 2017-02-21 DIAGNOSIS — E119 Type 2 diabetes mellitus without complications: Secondary | ICD-10-CM | POA: Diagnosis not present

## 2017-02-21 DIAGNOSIS — Z1211 Encounter for screening for malignant neoplasm of colon: Secondary | ICD-10-CM | POA: Diagnosis not present

## 2017-02-21 NOTE — Assessment & Plan Note (Signed)
S: controlled on ramipril 20mg , metoprolol 100mg  XR, hctz 25mg  A/P: We discussed blood pressure goal of <140/90. Continue current meds

## 2017-02-21 NOTE — Assessment & Plan Note (Signed)
S: well controlled. On metformin 1g BID and glipizide 10mg  BID in the past Lab Results  Component Value Date   HGBA1C 6.5 06/21/2016   HGBA1C 7.1 (H) 01/25/2016   HGBA1C 6.9 (H) 07/22/2014   A/P: update labs including a1c

## 2017-02-21 NOTE — Progress Notes (Signed)
Phone: [phone]  Subjective:  Patient presents today to establish care with me as their new primary care provider. Patient was formerly a patient of [facility]. Chief complaint-noted.   See problem oriented charting ROS- No chest pain or shortness of breath. No headache or blurry vision. No hypoglycemia.   The following were reviewed and entered/updated in epic: Past Medical History:  Diagnosis Date  . Diabetes type 2, controlled (HCC)   . Hyperlipidemia   . Hypertension   . UNSPECIFIED TACHYCARDIA 05/06/2007   Listed 05/06/2007- do not see recurrent tachycardia since that time    Patient Active Problem List   Diagnosis Date Noted  . Diabetes mellitus type II, controlled (HCC) 11/25/2006    Priority: High  . Hyperlipidemia associated with type 2 diabetes mellitus (HCC) 02/21/2017    Priority: Medium  . Essential hypertension 11/25/2006    Priority: Medium  . Erectile dysfunction 09/01/2014    Priority: Low  . Anemia, chronic disease 07/14/2009   Past Surgical History:  Procedure Laterality Date  . MULTIPLE TOOTH EXTRACTIONS      Family History  Problem Relation Age of Onset  . Diabetes Other   . Kidney disease Other   . Other Mother        respiratory failure died in 5580s  . Alcohol abuse Father   . Lung disease Father        smoking  . Heart disease Sister        heart attack [id number]  . Alcohol abuse Brother   . Cirrhosis Brother   . Kidney disease Sister   . COPD Sister        led to death. procedural complication  . Diabetes Brother     Medications- reviewed and updated Current Outpatient Medications  Medication Sig Dispense Refill  . glipiZIDE (GLUCOTROL) 10 MG tablet TAKE 1 TABLET BY MOUTH TWICE DAILY BEFORE A MEAL 180 tablet 0  . glipiZIDE (GLUCOTROL) 10 MG tablet TAKE 1 TABLET BY MOUTH TWICE DAILY BEFORE A MEAL 180 tablet 0  . hydrochlorothiazide (HYDRODIURIL) 25 MG tablet TAKE 1 TABLET(25 MG) BY MOUTH DAILY 90 tablet 0  . metFORMIN (GLUCOPHAGE) 1000  MG tablet Take 1 tablet (1,000 mg total) by mouth 2 (two) times daily with a meal. 180 tablet 3  . metoprolol succinate (TOPROL-XL) 100 MG 24 hr tablet TAKE 1 TABLET BY MOUTH DAILY. TAKE WITH OR IMMEDIATELY FOLLOWING A MEAL 90 tablet 0  . ramipril (ALTACE) 10 MG capsule TAKE 2 CAPSULES BY MOUTH EVERY MORNING 180 capsule 0  . sildenafil (REVATIO) 20 MG tablet Take 1-5 tablets as needed. 45 tablet 0  . simvastatin (ZOCOR) 10 MG tablet TAKE 1 TABLET(10 MG) BY MOUTH AT BEDTIME 90 tablet 3   No current facility-administered medications for this visit.     Allergies-reviewed and updated No Known Allergies  Social History   Socioeconomic History  . Marital status: Married    Spouse name: None  . Number of children: None  . Years of education: None  . Highest education level: None  Social Needs  . Financial resource strain: None  . Food insecurity - worry: None  . Food insecurity - inability: None  . Transportation needs - medical: None  . Transportation needs - non-medical: None  Occupational History  . None  Tobacco Use  . Smoking status: Never Smoker  . Smokeless tobacco: Never Used  Substance and Sexual Activity  . Alcohol use: No  . Drug use: No  . Sexual activity: Yes  Other Topics Concern  . None  Social History Narrative   Married 3 children. Wife and son patient here. 2 grandkids.       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    [employer] undergrad   1978 [employer]- 4 degrees through 2000- [employer], math. 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       Thinking about doing something at university level- taking off for 6 months.       [facility] [employer] [facility]  Church- pretty involved there- deacon, helps with education.     Objective: BP 136/78   Pulse 74   Temp 98.2 F (36.8 C) (Oral)   Ht 6\' 5"  (1.956 m)   Wt 239 lb 6.4 oz (108.6 kg)   SpO2 98%   BMI 28.39 kg/m  Gen: NAD, resting comfortably CV: RRR no murmurs rubs or gallops Lungs: CTAB no crackles, wheeze, rhonchi Abdomen: soft/nontender/nondistended/normal bowel sounds. overweight Ext: no edema Skin: warm, dry Neuro: grossly normal, moves all extremities, PERRLA  Diabetic Foot Exam - Simple   Simple Foot Form Diabetic Foot exam was performed with the following findings:  Yes 02/21/2017  1:47 PM  Visual Inspection No deformities, no ulcerations, no other skin breakdown bilaterally:  Yes Sensation Testing Intact to touch and monofilament testing bilaterally:  Yes Pulse Check Posterior Tibialis and Dorsalis pulse intact bilaterally:  Yes Comments Several calluses - planning to follow up on this with podiatry- also will have them trim nails    Assessment/Plan:  Health Maintenance Due  Topic Date Due  . Hepatitis C Screening - opts in Feb 27, 1951  . OPHTHALMOLOGY EXAM - scheduling early January 2019 02/11/1959  . COLONOSCOPY - refer today 02/11/1999  . INFLUENZA VACCINE -today 10/03/2016  . HEMOGLOBIN A1C - needs labs 12/21/2016   Essential hypertension S: controlled on ramipril 20mg , metoprolol 100mg  XR, hctz 25mg  A/P: We discussed blood pressure goal of <140/90. Continue current meds  Diabetes mellitus type II, controlled (HCC) S: well controlled. On metformin 1g BID and glipizide 10mg  BID in the past Lab Results  Component Value Date   HGBA1C 6.5 06/21/2016   HGBA1C 7.1 (H) 01/25/2016   HGBA1C 6.9 (H) 07/22/2014   A/P: update labs including a1c  Hyperlipidemia associated with type 2 diabetes mellitus (HCC) S: well controlled on simvastatin 10mg . No myalgias.  Lab Results  Component Value Date   CHOL 125 01/25/2016   HDL 39.30 01/25/2016   LDLCALC 67  01/25/2016   LDLDIRECT 123.6 03/01/2008   TRIG 95.0 01/25/2016   CHOLHDL 3 01/25/2016   A/P: need to update lipids  6 months for physical as long as a1c under 7  Orders Placed This Encounter  Procedures  . Flu vaccine HIGH DOSE PF  . Hemoglobin A1c    [place]    Standing Status:   Future    Standing Expiration Date:   02/21/2018  . CBC    Standing Status:   Future    Standing Expiration Date:   02/21/2018  . Comprehensive metabolic panel    [place]    Standing Status:   Future    Standing Expiration Date:   02/21/2018  . Lipid panel    Standing Status:   Future  Standing Expiration Date:   02/21/2018  . Hepatitis C antibody    Standing Status:   Future    Standing Expiration Date:   02/21/2018  . PSA    Standing Status:   Future    Standing Expiration Date:   02/21/2018  . Ambulatory referral to Gastroenterology    Referral Priority:   Routine    Referral Type:   Consultation    Referral Reason:   Specialty Services Required    Number of Visits Requested:   1   Return precautions advised.  Tana ConchStephen Chinwe Lope, MD

## 2017-02-21 NOTE — Assessment & Plan Note (Signed)
S: well controlled on simvastatin 10mg . No myalgias.  Lab Results  Component Value Date   CHOL 125 01/25/2016   HDL 39.30 01/25/2016   LDLCALC 67 01/25/2016   LDLDIRECT 123.6 03/01/2008   TRIG 95.0 01/25/2016   CHOLHDL 3 01/25/2016   A/P: need to update lipids

## 2017-02-21 NOTE — Patient Instructions (Addendum)
Schedule a lab visit at the check out desk within 2 weeks. Return for future fasting labs meaning nothing but water after midnight please. Ok to take your medications with water.   Wt Readings from Last 3 Encounters:  02/21/17 239 lb 6.4 oz (108.6 kg)  10/30/16 236 lb (107 kg)  06/21/16 243 lb 3.2 oz (110.3 kg)  congrats on getting weight down from 270 a few years ago- slow steady weight loss is great! Keep it up

## 2017-03-06 ENCOUNTER — Other Ambulatory Visit: Payer: Medicare Other

## 2017-03-14 ENCOUNTER — Other Ambulatory Visit (INDEPENDENT_AMBULATORY_CARE_PROVIDER_SITE_OTHER): Payer: Medicare Other

## 2017-03-14 DIAGNOSIS — E119 Type 2 diabetes mellitus without complications: Secondary | ICD-10-CM

## 2017-03-14 DIAGNOSIS — E785 Hyperlipidemia, unspecified: Secondary | ICD-10-CM | POA: Diagnosis not present

## 2017-03-14 DIAGNOSIS — Z1159 Encounter for screening for other viral diseases: Secondary | ICD-10-CM

## 2017-03-14 DIAGNOSIS — Z125 Encounter for screening for malignant neoplasm of prostate: Secondary | ICD-10-CM | POA: Diagnosis not present

## 2017-03-14 DIAGNOSIS — I10 Essential (primary) hypertension: Secondary | ICD-10-CM

## 2017-03-14 DIAGNOSIS — E1169 Type 2 diabetes mellitus with other specified complication: Secondary | ICD-10-CM

## 2017-03-14 LAB — CBC
HCT: 38.3 % — ABNORMAL LOW (ref 39.0–52.0)
Hemoglobin: 12.4 g/dL — ABNORMAL LOW (ref 13.0–17.0)
MCHC: 32.2 g/dL (ref 30.0–36.0)
MCV: 97.3 fl (ref 78.0–100.0)
PLATELETS: 240 10*3/uL (ref 150.0–400.0)
RBC: 3.94 Mil/uL — AB (ref 4.22–5.81)
RDW: 14 % (ref 11.5–15.5)
WBC: 4.8 10*3/uL (ref 4.0–10.5)

## 2017-03-14 LAB — COMPREHENSIVE METABOLIC PANEL
ALBUMIN: 4.2 g/dL (ref 3.5–5.2)
ALK PHOS: 59 U/L (ref 39–117)
ALT: 12 U/L (ref 0–53)
AST: 14 U/L (ref 0–37)
BILIRUBIN TOTAL: 0.5 mg/dL (ref 0.2–1.2)
BUN: 21 mg/dL (ref 6–23)
CALCIUM: 9.4 mg/dL (ref 8.4–10.5)
CO2: 30 meq/L (ref 19–32)
CREATININE: 1.67 mg/dL — AB (ref 0.40–1.50)
Chloride: 104 mEq/L (ref 96–112)
GFR: 52.87 mL/min — AB (ref >60.00)
Glucose, Bld: 67 mg/dL — ABNORMAL LOW (ref 70–99)
Potassium: 5.1 mEq/L (ref 3.5–5.1)
Sodium: 140 mEq/L (ref 135–145)
TOTAL PROTEIN: 7.2 g/dL (ref 6.0–8.3)

## 2017-03-14 LAB — LIPID PANEL
CHOL/HDL RATIO: 4
CHOLESTEROL: 150 mg/dL (ref 0–200)
HDL: 39.7 mg/dL (ref >39.00)
LDL Cholesterol: 87 mg/dL (ref 0–99)
NonHDL: 109.95
TRIGLYCERIDES: 114 mg/dL (ref 0.0–149.0)
VLDL: 22.8 mg/dL (ref 0.0–40.0)

## 2017-03-14 LAB — PSA: PSA: 0.58 ng/mL (ref 0.10–4.00)

## 2017-03-14 LAB — HEMOGLOBIN A1C: HEMOGLOBIN A1C: 7 % — AB (ref 4.6–6.5)

## 2017-03-15 ENCOUNTER — Other Ambulatory Visit: Payer: Self-pay

## 2017-03-15 DIAGNOSIS — E139 Other specified diabetes mellitus without complications: Secondary | ICD-10-CM

## 2017-03-15 LAB — HEPATITIS C ANTIBODY
Hepatitis C Ab: NONREACTIVE
SIGNAL TO CUT-OFF: 0.02 (ref <1.00)

## 2017-03-15 MED ORDER — METFORMIN HCL 1000 MG PO TABS: 1000.0000 mg | ORAL_TABLET | Freq: Two times a day (BID) | ORAL | 3 refills | Status: DC

## 2017-03-25 ENCOUNTER — Encounter: Payer: Self-pay | Admitting: Family Medicine

## 2017-04-03 ENCOUNTER — Other Ambulatory Visit: Payer: Self-pay | Admitting: Adult Health

## 2017-04-03 ENCOUNTER — Telehealth: Payer: Self-pay | Admitting: Family Medicine

## 2017-04-03 NOTE — Telephone Encounter (Signed)
Copied from CRM [id number]. Topic: Quick Communication - See Telephone Encounter >> Apr 03, 2017  1:50 PM Landry MellowFoltz, Melissa J wrote: CRM for notification. See Telephone encounter for:   04/03/17. Pt called to let us know that the pharm has faxed over requests for multiple medications and is waiting for approval. If any questions please call [phone] . Pt wanted to leave message for assistant

## 2017-04-03 NOTE — Telephone Encounter (Signed)
See note

## 2017-04-04 ENCOUNTER — Telehealth: Payer: Self-pay | Admitting: Family Medicine

## 2017-04-04 NOTE — Telephone Encounter (Signed)
Medication sent in electronically.  

## 2017-04-04 NOTE — Telephone Encounter (Signed)
Copied from CRM [id number]. Topic: Quick Communication - See Telephone Encounter >> Apr 04, 2017 10:31 AM Guinevere FerrariMorris, Asiana Benninger E, NT wrote: CRM for notification. See Telephone encounter for: Patient is calling to see if the nurse or doctor could call him back regarding his medication.  04/04/17.

## 2017-04-05 NOTE — Telephone Encounter (Signed)
Spoke to pt, told him his medications were filled by [facility] at [facility] and were sent to pharmacy. Pt said yes, he did get his medications but did not understand why they went there and not to Dr. Durene CalHunter. Told him he will need to let pharmacist know that Dr. Durene CalHunter is his PCP now and he is at [facility]. Pt verbalized understanding.

## 2017-07-14 ENCOUNTER — Other Ambulatory Visit: Payer: Self-pay | Admitting: Adult Health

## 2017-07-14 ENCOUNTER — Other Ambulatory Visit: Payer: Self-pay | Admitting: Family Medicine

## 2017-10-09 ENCOUNTER — Other Ambulatory Visit: Payer: Self-pay | Admitting: Adult Health

## 2017-12-24 ENCOUNTER — Telehealth: Payer: Self-pay

## 2017-12-24 NOTE — Telephone Encounter (Signed)
Copied from CRM [id number]. Topic: Quick Communication - See Telephone Encounter >> Dec 23, 2017  3:46 PM Luanna Cole wrote: CRM for notification. See Telephone encounter for: 12/23/17. Pt called and stated that he would like a nurse to give him a call regarding a call he received from a nurse from the office to schedule an appointment. Please advise  Patient had received a call regarding an AWV. I scheduled him in November after explaining what an AWV consist of and the screenings that are completed in the visit.

## 2018-01-03 ENCOUNTER — Other Ambulatory Visit: Payer: Self-pay | Admitting: Family Medicine

## 2018-01-08 ENCOUNTER — Ambulatory Visit (INDEPENDENT_AMBULATORY_CARE_PROVIDER_SITE_OTHER): Payer: Medicare Other

## 2018-01-08 VITALS — BP 138/84 | HR 66 | Temp 97.8°F | Ht 77.0 in | Wt 231.8 lb

## 2018-01-08 DIAGNOSIS — Z23 Encounter for immunization: Secondary | ICD-10-CM | POA: Diagnosis not present

## 2018-01-08 DIAGNOSIS — Z1211 Encounter for screening for malignant neoplasm of colon: Secondary | ICD-10-CM | POA: Diagnosis not present

## 2018-01-08 DIAGNOSIS — Z0000 Encounter for general adult medical examination without abnormal findings: Secondary | ICD-10-CM | POA: Diagnosis not present

## 2018-01-08 NOTE — Progress Notes (Signed)
PCP notes: Last OV 02/21/2017   Health maintenance: Eye Exam-will call and schedule, Colonoscopy-can place referral, Hemoglobin A1C-will complete at visit with Dr. Durene Cal, Flu Vaccine-completed today   Abnormal screenings: None   Patient concerns: Patient wanting to discus Sildenafil versus Cialis. Occasional right sided back pain, feel sit is from lifting boxes as they recently moved and have been unpacking boxes.   Nurse concerns:None   Next PCP appt: schedule for December-scheduled for 02/06/18

## 2018-01-08 NOTE — Patient Instructions (Signed)
Mark Ali , Thank you for taking time to come for your Medicare Wellness Visit. I appreciate your ongoing commitment to your health goals. Please review the following plan we discussed and let me know if I can assist you in the future.   These are the goals we discussed: Goals    . DIET - INCREASE WATER INTAKE    . Exercise 150 min/wk Moderate Activity       This is a list of the screening recommended for you and due dates:  Health Maintenance  Topic Date Due  . Eye exam for diabetics  02[birth year]1959  . Colon Cancer Screening  02/11/1999  . Hemoglobin A1C  09/11/2017  . Flu Shot  10/03/2017  . Complete foot exam   02/21/2018  . Tetanus Vaccine  03/04/2021  .  Hepatitis C: One time screening is recommended by Center for Disease Control  (CDC) for  adults born from [birth year] through 1967.   Completed  . Pneumonia vaccines  Completed   Preventive Care for Adults  A healthy lifestyle and preventive care can promote health and wellness. Preventive health guidelines for adults include the following key practices.  . A routine yearly physical is a good way to check with your health care provider about your health and preventive screening. It is a chance to share any concerns and updates on your health and to receive a thorough exam.  . Visit your dentist for a routine exam and preventive care every 6 months. Brush your teeth twice a day and floss once a day. Good oral hygiene prevents tooth decay and gum disease.  . The frequency of eye exams is based on your age, health, family medical history, use  of contact lenses, and other factors. Follow your health care provider's recommendations for frequency of eye exams.  . Eat a healthy diet. Foods like vegetables, fruits, whole grains, low-fat dairy products, and lean protein foods contain the nutrients you need without too many calories. Decrease your intake of foods high in solid fats, added sugars, and salt. Eat the right amount of calories for  you. Get information about a proper diet from your health care provider, if necessary.  . Regular physical exercise is one of the most important things you can do for your health. Most adults should get at least 150 minutes of moderate-intensity exercise (any activity that increases your heart rate and causes you to sweat) each week. In addition, most adults need muscle-strengthening exercises on 2 or more days a week.  Silver Sneakers may be a benefit available to you. To determine eligibility, you may visit the website: www.silversneakers.com or contact program at 310-605-9859 Mon-Fri between 8AM-8PM.   . Maintain a healthy weight. The body mass index (BMI) is a screening tool to identify possible weight problems. It provides an estimate of body fat based on height and weight. Your health care provider can find your BMI and can help you achieve or maintain a healthy weight.   For adults 20 years and older: ? A BMI below 18.5 is considered underweight. ? A BMI of 18.5 to 24.9 is normal. ? A BMI of 25 to 29.9 is considered overweight. ? A BMI of 30 and above is considered obese.   . Maintain normal blood lipids and cholesterol levels by exercising and minimizing your intake of saturated fat. Eat a balanced diet with plenty of fruit and vegetables. Blood tests for lipids and cholesterol should begin at age 87 and be repeated every 5 years.  If your lipid or cholesterol levels are high, you are over 50, or you are at high risk for heart disease, you may need your cholesterol levels checked more frequently. Ongoing high lipid and cholesterol levels should be treated with medicines if diet and exercise are not working.  . If you smoke, find out from your health care provider how to quit. If you do not use tobacco, please do not start.  . If you choose to drink alcohol, please do not consume more than 2 drinks per day. One drink is considered to be 12 ounces (355 mL) of beer, 5 ounces (148 mL) of  wine, or 1.5 ounces (44 mL) of liquor.  . If you are 71-75 years old, ask your health care provider if you should take aspirin to prevent strokes.  . Use sunscreen. Apply sunscreen liberally and repeatedly throughout the day. You should seek shade when your shadow is shorter than you. Protect yourself by wearing long sleeves, pants, a wide-brimmed hat, and sunglasses year round, whenever you are outdoors.  . Once a month, do a whole body skin exam, using a mirror to look at the skin on your back. Tell your health care provider of new moles, moles that have irregular borders, moles that are larger than a pencil eraser, or moles that have changed in shape or color.

## 2018-01-08 NOTE — Progress Notes (Signed)
Subjective:   Mark Cunas Sr. is a 67 y.o. male who presents for an Initial Medicare Annual Wellness Visit.  Review of Systems  No ROS.  Medicare Wellness Visit. Additional risk factors are reflected in the social history. Cardiac Risk Factors include: advanced age (>69men, >20 women);male gender Patient lives in a 2 story home with wife Mark Ali of 35 years. They have 3 children. They recently moved from [facility] to [place]. They do not have any pets. 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 Patient is a member of [facility].   Patient goes to bed around midnight. Sleeps through the night. Patient gets up between 6-7am. Patient feels rested most mornings.   Objective:    There were no vitals filed for this visit. There is no height or weight on file to calculate BMI.  Advanced Directives 01/08/2018  Does Patient Have a Medical Advance Directive? Yes  Type of Advance Directive Healthcare Power of Attorney  Does patient want to make changes to medical advance directive? No - Patient declined    Current Medications (verified) Outpatient Encounter Medications as of 01/08/2018  Medication Sig  . glipiZIDE (GLUCOTROL) 10 MG tablet TAKE 1 TABLET BY MOUTH TWICE DAILY BEFORE MEALS  . hydrochlorothiazide (HYDRODIURIL) 25 MG tablet TAKE 1 TABLET BY MOUTH DAILY  . metFORMIN (GLUCOPHAGE) 1000 MG tablet Take 1 tablet (1,000 mg total) by mouth 2 (two) times daily with a meal.  . metoprolol succinate (TOPROL-XL) 100 MG 24 hr tablet TAKE 1 TABLET BY MOUTH DAILY. TAKE WITH OR IMMEDIATELY FOLLOWING A MEAL  . ramipril (ALTACE) 10 MG capsule TAKE 2 CAPSULES BY MOUTH EVERY MORNING  . sildenafil (REVATIO) 20 MG tablet Take 1-5 tablets as needed.  . simvastatin (ZOCOR) 10 MG tablet TAKE 1 TABLET(10 MG) BY MOUTH AT BEDTIME  . [DISCONTINUED] glipiZIDE (GLUCOTROL) 10 MG tablet TAKE 1 TABLET BY MOUTH  TWICE DAILY BEFORE A MEAL  . [DISCONTINUED] glipiZIDE (GLUCOTROL) 10 MG tablet TAKE 1 TABLET BY MOUTH TWICE DAILY BEFORE MEALS  . [DISCONTINUED] hydrochlorothiazide (HYDRODIURIL) 25 MG tablet TAKE 1 TABLET BY MOUTH DAILY  . [DISCONTINUED] metoprolol succinate (TOPROL-XL) 100 MG 24 hr tablet TAKE 1 TABLET BY MOUTH DAILY. TAKE WITH OR IMMEDIATELY FOLLOWING A MEAL  . [DISCONTINUED] ramipril (ALTACE) 10 MG capsule TAKE 2 CAPSULES BY MOUTH EVERY MORNING  . [DISCONTINUED] sildenafil (VIAGRA) 50 MG tablet TAKE 1 TABLET BY MOUTH AS NEEDED FOR ERECTILE DYSFUNCTION   No facility-administered encounter medications on file as of 01/08/2018.     Allergies (verified) Patient has no known allergies.   History: Past Medical History:  Diagnosis Date  . Diabetes type 2, controlled (HCC)   . Hyperlipidemia   . Hypertension   . UNSPECIFIED TACHYCARDIA 05/06/2007   Listed 05/06/2007- do not see recurrent tachycardia since that time    Past Surgical History:  Procedure Laterality Date  . MULTIPLE TOOTH EXTRACTIONS     Family History  Problem Relation Age of Onset  . Diabetes Other   . Kidney disease Other   . Other Mother        respiratory failure died in 80s  . Alcohol abuse Father   . Lung disease Father        smoking  . Heart disease Sister        heart attack 67  . Alcohol abuse Brother   . Cirrhosis Brother   . Kidney disease Sister   .  COPD Sister        led to death. procedural complication  . Diabetes Brother    Social History   Socioeconomic History  . Marital status: Married    Spouse name: Not on file  . Number of children: Not on file  . Years of education: Not on file  . Highest education level: Not on file  Occupational History  . Not on file  Social Needs  . Financial resource strain: Not on file  . Food insecurity:    Worry: Not on file    Inability: Not on file  . Transportation needs:    Medical: Not on file    Non-medical: Not on file  Tobacco Use  . Smoking  status: Never Smoker  . Smokeless tobacco: Never Used  Substance and Sexual Activity  . Alcohol use: No  . Drug use: No  . Sexual activity: Yes  Lifestyle  . Physical activity:    Days per week: Not on file    Minutes per session: Not on file  . Stress: Not on file  Relationships  . Social connections:    Talks on phone: Not on file    Gets together: Not on file    Attends religious service: Not on file    Active member of club or organization: Not on file    Attends meetings of clubs or organizations: Not on file    Relationship status: Not on file  Other Topics Concern  . Not on file  Social History Narrative   Married 3 children. Wife and son patient here. 2 grandkids.       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    [employer] under[employer]   1978 [employer]- 4 degrees through 2000- [employer], math. 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       Thinking about doing something at university level- taking off for 6 months.       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    Tobacco Counseling Counseling given: Not Answered   Activities of Daily Living In your present state of health, do you have any difficulty performing the following activities: 01/08/2018  Hearing? N  Vision? N  Difficulty concentrating or making decisions? N  Walking or climbing stairs? N  Dressing or bathing? N  Doing errands, shopping? N  Preparing Food and eating ? N  Using the Toilet? N  In the past six months, have you accidently leaked urine? N  Do you have problems with loss of bowel control? N  Managing your Medications? N  Managing your  Finances? N  Housekeeping or managing your Housekeeping? N  Some recent data might be hidden     Immunizations and Health Maintenance Immunization History  Administered Date(s) Administered  . Influenza Split 03/05/2011  . Influenza Whole 03/05/2005  . Influenza, High Dose Seasonal PF 02/21/2017  . Influenza,inj,Quad PF,6+ Mos 04/29/2014  . Pneumococcal Conjugate-13 09/01/2014  . Pneumococcal Polysaccharide-23 03/05/2006, 02/02/2016  . Td 03/06/1999  . Tdap 03/05/2011  . Zoster 09/03/2014   Health Maintenance Due  Topic Date Due  . OPHTHALMOLOGY EXAM  02[birth year]11[birth year]1959  . COLONOSCOPY  02/11/1999  . HEMOGLOBIN A1C  09/11/2017  .  INFLUENZA VACCINE  10/03/2017    Patient Care Team: Shelva Majestic, MD as PCP - General (Family Medicine) Durene Cal Aldine Contes, MD as Consulting Physician (Family Medicine)  Indicate any recent Medical Services you may have received from other than [facility] providers in the past year (date may be approximate).    Assessment:   This is a routine wellness examination for [place].  Hearing/Vision screen  Hearing Screening   Method: Audiometry   125Hz  250Hz  500Hz  1000Hz  2000Hz  3000Hz  4000Hz  6000Hz  8000Hz   Right ear:   Pass Pass Pass  Pass    Left ear:   Pass Pass Pass  Pass      Visual Acuity Screening   Right eye Left eye Both eyes  Without correction:     With correction: 20/20 20/20 20/20   Comments: Wears glasses [facility]   Dietary issues and exercise activities discussed: Current Exercise Habits: Structured exercise class, Type of exercise: strength training/weights;treadmill;walking, Time (Minutes): 60, Frequency (Times/Week): 4, Weekly Exercise (Minutes/Week): 240, Intensity: Moderate, Exercise limited by: None identified   Breakfast: Eggs, grits, cereal. Orange juice, water, milk  Lunch: Soup, sandwich, salad, Lemonade, occasional soda, fruit juice  Dinner: Pasta, salad, water to drink, and a soda  Likes sweet and salty  Goals    .  DIET - INCREASE WATER INTAKE    . Exercise 150 min/wk Moderate Activity      Depression Screen PHQ 2/9 Scores 01/08/2018 02/21/2017 02/03/2016 04/29/2014  PHQ - 2 Score 0 0 0 0  PHQ- 9 Score 0 - - -    Fall Risk Fall Risk  01/08/2018 02/21/2017 02/03/2016 04/29/2014  Falls in the past year? 0 No No No      Cognitive Function:     6CIT Screen 01/08/2018  What Year? 0 points  What month? 0 points  What time? 0 points  Count back from 20 0 points  Months in reverse 0 points  Repeat phrase 0 points  Total Score 0    Screening Tests Health Maintenance  Topic Date Due  . OPHTHALMOLOGY EXAM  02[birth year]11/1959  . COLONOSCOPY  02/11/1999  . HEMOGLOBIN A1C  09/11/2017  . INFLUENZA VACCINE  10/03/2017  . FOOT EXAM  02/21/2018  . TETANUS/TDAP  03/04/2021  . Hepatitis C Screening  Completed  . PNA vac Low Risk Adult  Completed           Plan:    Follow UP with PCP as Advised  I have personally reviewed and noted the following in the patient's chart:   . Medical and social history . Use of alcohol, tobacco or illicit drugs  . Current medications and supplements . Functional ability and status . Nutritional status . Physical activity . Advanced directives . List of other physicians . Vitals . Screenings to include cognitive, depression, and falls . Referrals and appointments  In addition, I have reviewed and discussed with patient certain preventive protocols, quality metrics, and best practice recommendations. A written personalized care plan for preventive services as well as general preventive health recommendations were provided to patient.     Angelina Sheriff [place], [place]   [id number]/[id number]

## 2018-01-09 NOTE — Progress Notes (Signed)
I have reviewed and agree with note, evaluation, plan.  I look forward to her visit next month-we will update A1c.  We will discuss erectile dysfunction medications.  We can discuss his back pain as well.  Happy to see him sooner if needed  Tana Conch, MD

## 2018-02-06 ENCOUNTER — Ambulatory Visit: Payer: Medicare Other | Admitting: Family Medicine

## 2018-03-13 ENCOUNTER — Ambulatory Visit (INDEPENDENT_AMBULATORY_CARE_PROVIDER_SITE_OTHER): Payer: Medicare Other | Admitting: Family Medicine

## 2018-03-13 ENCOUNTER — Encounter: Payer: Self-pay | Admitting: Family Medicine

## 2018-03-13 VITALS — BP 130/72 | HR 58 | Temp 98.1°F | Wt 232.6 lb

## 2018-03-13 DIAGNOSIS — Z125 Encounter for screening for malignant neoplasm of prostate: Secondary | ICD-10-CM | POA: Diagnosis not present

## 2018-03-13 DIAGNOSIS — E785 Hyperlipidemia, unspecified: Secondary | ICD-10-CM

## 2018-03-13 DIAGNOSIS — E119 Type 2 diabetes mellitus without complications: Secondary | ICD-10-CM

## 2018-03-13 DIAGNOSIS — E1169 Type 2 diabetes mellitus with other specified complication: Secondary | ICD-10-CM | POA: Diagnosis not present

## 2018-03-13 DIAGNOSIS — I10 Essential (primary) hypertension: Secondary | ICD-10-CM

## 2018-03-13 LAB — POCT GLYCOSYLATED HEMOGLOBIN (HGB A1C): Hemoglobin A1C: 6.2 % — AB (ref 4.0–5.6)

## 2018-03-13 MED ORDER — SIMVASTATIN 10 MG PO TABS: ORAL_TABLET | ORAL | 1 refills | Status: DC

## 2018-03-13 NOTE — Assessment & Plan Note (Signed)
S:  controlled on simvastatin 10 mg Lab Results  Component Value Date   CHOL 150 03/14/2017   HDL 39.70 03/14/2017   LDLCALC 87 03/14/2017   LDLDIRECT 123.6 03/01/2008   TRIG 114.0 03/14/2017   CHOLHDL 4 03/14/2017   A/P: 1 day too early for full lipid panel-we will have him come back fasting tomorrow or next week.

## 2018-03-13 NOTE — Patient Instructions (Addendum)
Health Maintenance Due  Topic Date Due  . OPHTHALMOLOGY EXAM - scheduled next week- please give them a copy of my card to fax results to [place] 02/11/1961  . COLONOSCOPY -  02/11/1999  . FOOT EXAM - today 02/21/2018   Schedule a lab visit at the check out desk within 2 weeks. Return for future fasting labs meaning nothing but water after midnight please. Ok to take your medications with water.   Please call [facility] at [phone] to schedule your Colonoscopy

## 2018-03-13 NOTE — Assessment & Plan Note (Signed)
S: controlled on hydrochlorothiazide 25 mg, ramipril 20 mg BP Readings from Last 3 Encounters:  03/13/18 130/72  01/08/18 138/84  02/21/17 136/78  A/P: We discussed blood pressure goal of <140/90. Continue current meds

## 2018-03-13 NOTE — Assessment & Plan Note (Signed)
S: controlled on glipizide 10 mg twice a day, metformin 1000 mg twice a day Lab Results  Component Value Date   HGBA1C 7.0 (H) 03/14/2017   HGBA1C 6.5 06/21/2016   HGBA1C 7.1 (H) 01/25/2016   A/P: Hopefully stable-update A1c today

## 2018-03-13 NOTE — Progress Notes (Signed)
Subjective:  Mark CunasCharles Edward Westerhold Ali. is a 68 y.o. year old very pleasant male patient who presents for/with See problem oriented charting ROS- No chest pain or shortness of breath. No headache or blurry vision.    Past Medical History-  Patient Active Problem List   Diagnosis Date Noted  . Diabetes mellitus type II, controlled (HCC) 11/25/2006    Priority: High  . Hyperlipidemia associated with type 2 diabetes mellitus (HCC) 02/21/2017    Priority: Medium  . Essential hypertension 11/25/2006    Priority: Medium  . Erectile dysfunction 09/01/2014    Priority: Low  . Anemia, chronic disease 07/14/2009    Medications- reviewed and updated Current Outpatient Medications  Medication Sig Dispense Refill  . glipiZIDE (GLUCOTROL) 10 MG tablet TAKE 1 TABLET BY MOUTH TWICE DAILY BEFORE MEALS 180 tablet 1  . hydrochlorothiazide (HYDRODIURIL) 25 MG tablet TAKE 1 TABLET BY MOUTH DAILY 90 tablet 1  . metFORMIN (GLUCOPHAGE) 1000 MG tablet Take 1 tablet (1,000 mg total) by mouth 2 (two) times daily with a meal. 180 tablet 3  . metoprolol succinate (TOPROL-XL) 100 MG 24 hr tablet TAKE 1 TABLET BY MOUTH DAILY. TAKE WITH OR IMMEDIATELY FOLLOWING A MEAL 90 tablet 1  . ramipril (ALTACE) 10 MG capsule TAKE 2 CAPSULES BY MOUTH EVERY MORNING 180 capsule 1  . sildenafil (REVATIO) 20 MG tablet Take 1-5 tablets as needed. 45 tablet 0  . simvastatin (ZOCOR) 10 MG tablet TAKE 1 TABLET(10 MG) BY MOUTH AT BEDTIME 90 tablet 1   No current facility-administered medications for this visit.     Objective: BP 130/72 (BP Location: Right Arm, Patient Position: Sitting, Cuff Size: Large)   Pulse (!) 58   Temp 98.1 F (36.7 C) (Oral)   Wt 232 lb 9.6 oz (105.5 kg)   SpO2 98%   BMI 27.58 kg/m  Gen: NAD, resting comfortably CV: RRR no murmurs rubs or gallops Lungs: CTAB no crackles, wheeze, rhonchi Abdomen: soft/nontender/nondistended/normal bowel sounds.  Ext: no edema Skin: warm, dry Neuro: grossly normal,  moves all extremities  Diabetic Foot Exam - Simple   Simple Foot Form Diabetic Foot exam was performed with the following findings:  Yes 03/13/2018  3:35 PM  Visual Inspection See comments:  Yes Sensation Testing Intact to touch and monofilament testing bilaterally:  Yes Pulse Check Posterior Tibialis and Dorsalis pulse intact bilaterally:  Yes Comments Hammertoes with slight callous build up     Assessment/Plan:  Back mentoring a HS principle- at [employer]  Occasional right low back pain-comes and goes- was worse with heavy box lifting a few months ago. Had sciatica one day- we discussed likely nerve related.   Erectile dysfunction-sildenafil seems to work well- he doesn't always need it thankfully.    Wants PSA checked with bloodwork. PSA trend not concerning so will defer rectal exam unless PSA trends upward significantly Lab Results  Component Value Date   PSA 0.58 03/14/2017   PSA 0.50 01/25/2016   PSA 0.36 07/22/2014   Diabetes mellitus type II, controlled (HCC) S: controlled on glipizide 10 mg twice a day, metformin 1000 mg twice a day Lab Results  Component Value Date   HGBA1C 7.0 (H) 03/14/2017   HGBA1C 6.5 06/21/2016   HGBA1C 7.1 (H) 01/25/2016   A/P: Hopefully stable-update A1c today  Essential hypertension S: controlled on hydrochlorothiazide 25 mg, ramipril 20 mg BP Readings from Last 3 Encounters:  03/13/18 130/72  01/08/18 138/84  02/21/17 136/78  A/P: We discussed blood pressure goal of <140/90.  Continue current meds  Hyperlipidemia associated with type 2 diabetes mellitus (HCC) S:  controlled on simvastatin 10 mg Lab Results  Component Value Date   CHOL 150 03/14/2017   HDL 39.70 03/14/2017   LDLCALC 87 03/14/2017   LDLDIRECT 123.6 03/01/2008   TRIG 114.0 03/14/2017   CHOLHDL 4 03/14/2017   A/P: 1 day too early for full lipid panel-we will have him come back fasting tomorrow or next week.  Return in about 4 months (around 07/12/2018) for  physical.  Lab/Order associations: Screening for prostate cancer - Plan: PSA  Controlled type 2 diabetes mellitus without complication, without long-term current use of insulin (HCC) - Plan: CBC, Comprehensive metabolic panel, Lipid panel, POCT glycosylated hemoglobin (Hb A1C)  Essential hypertension - Plan: CBC, Comprehensive metabolic panel, Lipid panel  Hyperlipidemia associated with type 2 diabetes mellitus (HCC) - Plan: CBC, Comprehensive metabolic panel, Lipid panel  Meds ordered this encounter  Medications  . simvastatin (ZOCOR) 10 MG tablet    Sig: TAKE 1 TABLET(10 MG) BY MOUTH AT BEDTIME    Dispense:  90 tablet    Refill:  1   Return precautions advised.  Tana Conch, MD

## 2018-04-03 ENCOUNTER — Other Ambulatory Visit (INDEPENDENT_AMBULATORY_CARE_PROVIDER_SITE_OTHER): Payer: Medicare Other

## 2018-04-03 DIAGNOSIS — E1169 Type 2 diabetes mellitus with other specified complication: Secondary | ICD-10-CM | POA: Diagnosis not present

## 2018-04-03 DIAGNOSIS — Z125 Encounter for screening for malignant neoplasm of prostate: Secondary | ICD-10-CM | POA: Diagnosis not present

## 2018-04-03 DIAGNOSIS — I10 Essential (primary) hypertension: Secondary | ICD-10-CM | POA: Diagnosis not present

## 2018-04-03 DIAGNOSIS — E119 Type 2 diabetes mellitus without complications: Secondary | ICD-10-CM

## 2018-04-03 DIAGNOSIS — E785 Hyperlipidemia, unspecified: Secondary | ICD-10-CM

## 2018-04-03 LAB — COMPREHENSIVE METABOLIC PANEL
ALT: 10 U/L (ref 0–53)
AST: 12 U/L (ref 0–37)
Albumin: 3.9 g/dL (ref 3.5–5.2)
Alkaline Phosphatase: 55 U/L (ref 39–117)
BUN: 24 mg/dL — ABNORMAL HIGH (ref 6–23)
CO2: 29 mEq/L (ref 19–32)
Calcium: 9.1 mg/dL (ref 8.4–10.5)
Chloride: 105 mEq/L (ref 96–112)
Creatinine, Ser: 1.63 mg/dL — ABNORMAL HIGH (ref 0.40–1.50)
GFR: 50.99 mL/min — ABNORMAL LOW (ref >60.00)
Glucose, Bld: 92 mg/dL (ref 70–99)
Potassium: 5.2 mEq/L — ABNORMAL HIGH (ref 3.5–5.1)
Sodium: 140 mEq/L (ref 135–145)
TOTAL PROTEIN: 6.3 g/dL (ref 6.0–8.3)
Total Bilirubin: 0.4 mg/dL (ref 0.2–1.2)

## 2018-04-03 LAB — CBC
HCT: 35.7 % — ABNORMAL LOW (ref 39.0–52.0)
Hemoglobin: 11.7 g/dL — ABNORMAL LOW (ref 13.0–17.0)
MCHC: 32.9 g/dL (ref 30.0–36.0)
MCV: 97 fl (ref 78.0–100.0)
Platelets: 214 10*3/uL (ref 150.0–400.0)
RBC: 3.67 Mil/uL — ABNORMAL LOW (ref 4.22–5.81)
RDW: 13.8 % (ref 11.5–15.5)
WBC: 4.2 10*3/uL (ref 4.0–10.5)

## 2018-04-03 LAB — LIPID PANEL
CHOLESTEROL: 128 mg/dL (ref 0–200)
HDL: 36.7 mg/dL — AB (ref >39.00)
LDL CALC: 74 mg/dL (ref 0–99)
NonHDL: 90.91
TRIGLYCERIDES: 86 mg/dL (ref 0.0–149.0)
Total CHOL/HDL Ratio: 3
VLDL: 17.2 mg/dL (ref 0.0–40.0)

## 2018-04-03 LAB — PSA: PSA: 0.56 ng/mL (ref 0.10–4.00)

## 2018-04-07 ENCOUNTER — Other Ambulatory Visit: Payer: Self-pay | Admitting: Family Medicine

## 2018-04-07 DIAGNOSIS — E139 Other specified diabetes mellitus without complications: Secondary | ICD-10-CM

## 2018-04-15 ENCOUNTER — Telehealth: Payer: Self-pay

## 2018-04-15 NOTE — Telephone Encounter (Signed)
Jamie, please see message.  

## 2018-04-15 NOTE — Telephone Encounter (Signed)
Copied from CRM [phone]#[id number]. Topic: General - Inquiry >> Apr 15, 2018 10:16 AM Crist InfanteHarrald, Kathy J wrote: Reason for CRM: pt states he called the other day and asked the person he spoke with about his labs to relay message that pt wants to speak with Asher MuirJamie.  Pt had not heard back. Would appreciate if Asher MuirJamie would call him.  Declined to provide any other information.

## 2018-04-15 NOTE — Telephone Encounter (Signed)
Called patient earlier and left a voicemail message. Charted on lab results note

## 2018-04-16 ENCOUNTER — Telehealth: Payer: Self-pay

## 2018-04-16 ENCOUNTER — Encounter: Payer: Self-pay | Admitting: Family Medicine

## 2018-04-16 NOTE — Telephone Encounter (Signed)
Routing...

## 2018-04-16 NOTE — Telephone Encounter (Signed)
Copied from CRM [id number]. Topic: General - Inquiry >> Apr 15, 2018 10:16 AM Crist Infante wrote: Reason for CRM: pt states he called the other day and asked the person he spoke with about his labs to relay message that pt wants to speak with Asher Muir.  Pt had not heard back. Would appreciate if Asher Muir would call him.  Declined to provide any other information. >> Apr 16, 2018  1:48 PM Marylen Ponto wrote: Pt requests to speak with Asher Muir. Pt stated this is the third message that he has left requesting that Asher Muir returns his call. Pt also stated he has not received any missed calls or a message. Pt requests call back at [phone]. Pt stated if Asher Muir does not call him back today he will just walk in to the office tomorrow.

## 2018-04-16 NOTE — Telephone Encounter (Signed)
This is just an FYI Adding to phone notes under labs and another phone note as well.

## 2018-04-17 NOTE — Telephone Encounter (Signed)
Called and reviewed over labs with patient. He verbalized understanding

## 2018-05-16 ENCOUNTER — Ambulatory Visit (INDEPENDENT_AMBULATORY_CARE_PROVIDER_SITE_OTHER): Payer: Medicare Other | Admitting: Physician Assistant

## 2018-05-16 ENCOUNTER — Other Ambulatory Visit: Payer: Self-pay

## 2018-05-16 ENCOUNTER — Encounter: Payer: Self-pay | Admitting: Physician Assistant

## 2018-05-16 VITALS — BP 140/80 | HR 59 | Temp 98.0°F | Wt 232.2 lb

## 2018-05-16 DIAGNOSIS — S60861A Insect bite (nonvenomous) of right wrist, initial encounter: Secondary | ICD-10-CM

## 2018-05-16 DIAGNOSIS — W57XXXA Bitten or stung by nonvenomous insect and other nonvenomous arthropods, initial encounter: Secondary | ICD-10-CM

## 2018-05-16 DIAGNOSIS — L0390 Cellulitis, unspecified: Secondary | ICD-10-CM | POA: Diagnosis not present

## 2018-05-16 MED ORDER — DOXYCYCLINE HYCLATE 100 MG PO TABS: 100.0000 mg | ORAL_TABLET | Freq: Two times a day (BID) | ORAL | 0 refills | Status: DC

## 2018-05-16 NOTE — Patient Instructions (Signed)
It was great to see you!  Start doxycycline antibiotic.  Follow-up with me or Dr. Durene Cal for any concerns!  Take care,  Jarold Motto PA-C

## 2018-05-16 NOTE — Progress Notes (Signed)
Mark Oliva Sr. is a 68 y.o. male here for a new problem.  History of Present Illness:   Chief Complaint  Patient presents with  . Insect Bite    x4-5 days ago.  Pt has taken Benadryl     HPI   Patient reports about 4 to 5 days ago he had an insect bite.  He did not see the insect.  With time the area has gotten a little bit more itchy, but he has been taking Benadryl with some relief.  He is used multiple things on this area including Neosporin, which hazel, rubbing alcohol, and a corticosteroid cream.  The area is slightly swollen, and a little bit tender.  He is diabetic the blood sugars are currently well controlled with diet.  He denies fever, chills, nausea, vomiting.  He has not had purulent drainage from this area. Overall, the area is slowly healing.   Past Medical History:  Diagnosis Date  . Diabetes type 2, controlled (HCC)   . Hyperlipidemia   . Hypertension   . UNSPECIFIED TACHYCARDIA 05/06/2007   Listed 05/06/2007- do not see recurrent tachycardia since that time      Social History   Socioeconomic History  . Marital status: Married    Spouse name: Not on file  . Number of children: Not on file  . Years of education: Not on file  . Highest education level: Not on file  Occupational History  . Not on file  Social Needs  . Financial resource strain: Not on file  . Food insecurity:    Worry: Not on file    Inability: Not on file  . Transportation needs:    Medical: Not on file    Non-medical: Not on file  Tobacco Use  . Smoking status: Never Smoker  . Smokeless tobacco: Never Used  Substance and Sexual Activity  . Alcohol use: No  . Drug use: No  . Sexual activity: Yes  Lifestyle  . Physical activity:    Days per week: Not on file    Minutes per session: Not on file  . Stress: Not on file  Relationships  . Social connections:    Talks on phone: Not on file    Gets together: Not on file    Attends religious service: Not on file    Active  member of club or organization: Not on file    Attends meetings of clubs or organizations: Not on file    Relationship status: Not on file  . Intimate partner violence:    Fear of current or ex partner: Not on file    Emotionally abused: Not on file    Physically abused: Not on file    Forced sexual activity: Not on file  Other Topics Concern  . Not on file  Social History Narrative   Married 3 children. Wife and son patient here. 2 grandkids.       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    [employer] undergrad   1978 [employer]- 4 degrees through 2000- [employer], math. 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       Thinking about doing something at university level- taking off for 6 months.  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     Past Surgical History:  Procedure Laterality Date  . MULTIPLE TOOTH EXTRACTIONS      Family History  Problem Relation Age of Onset  . Diabetes Other   . Kidney disease Other   . Other Mother        respiratory failure died in 21s  . Alcohol abuse Father   . Lung disease Father        smoking  . Heart disease Sister        heart attack 12  . Alcohol abuse Brother   . Cirrhosis Brother   . Kidney disease Sister   . COPD Sister        led to death. procedural complication  . Diabetes Brother     No Known Allergies  Current Medications:   Current Outpatient Medications:  .  glipiZIDE (GLUCOTROL) 10 MG tablet, TAKE 1 TABLET BY MOUTH TWICE DAILY BEFORE MEALS, Disp: 180 tablet, Rfl: 1 .  hydrochlorothiazide (HYDRODIURIL) 25 MG tablet, TAKE 1 TABLET BY MOUTH DAILY, Disp: 90 tablet,  Rfl: 1 .  metFORMIN (GLUCOPHAGE) 1000 MG tablet, TAKE 1 TABLET BY MOUTH TWICE DAILY WITH A MEAL, Disp: 180 tablet, Rfl: 3 .  metoprolol succinate (TOPROL-XL) 100 MG 24 hr tablet, TAKE 1 TABLET BY MOUTH DAILY. TAKE WITH OR IMMEDIATELY FOLLOWING A MEAL, Disp: 90 tablet, Rfl: 1 .  ramipril (ALTACE) 10 MG capsule, TAKE 2 CAPSULES BY MOUTH EVERY MORNING (Patient taking differently: daily. ), Disp: 180 capsule, Rfl: 1 .  sildenafil (REVATIO) 20 MG tablet, Take 1-5 tablets as needed., Disp: 45 tablet, Rfl: 0 .  simvastatin (ZOCOR) 10 MG tablet, TAKE 1 TABLET(10 MG) BY MOUTH AT BEDTIME, Disp: 90 tablet, Rfl: 1 .  doxycycline (VIBRA-TABS) 100 MG tablet, Take 1 tablet (100 mg total) by mouth 2 (two) times daily., Disp: 20 tablet, Rfl: 0   Review of Systems:   Review of Systems  Constitutional: Negative for chills, fever, malaise/fatigue and weight loss.  Respiratory: Negative for shortness of breath.   Cardiovascular: Negative for chest pain, orthopnea, claudication and leg swelling.  Gastrointestinal: Negative for heartburn, nausea and vomiting.  Skin: Positive for itching and rash.  Neurological: Negative for dizziness, tingling and headaches.     Vitals:   Vitals:   05/16/18 0942  BP: 140/80  Pulse: (!) 59  Temp: 98 F (36.7 C)  TempSrc: Oral  SpO2: 98%  Weight: 232 lb 4 oz (105.3 kg)     Body mass index is 27.54 kg/m.  Physical Exam:   Physical Exam Vitals signs and nursing note reviewed.  Constitutional:      Appearance: He is well-developed.  HENT:     Head: Normocephalic.  Eyes:     Conjunctiva/sclera: Conjunctivae normal.     Pupils: Pupils are equal, round, and reactive to light.  Neck:     Musculoskeletal: Normal range of motion.  Pulmonary:     Effort: Pulmonary effort is normal.  Musculoskeletal: Normal range of motion.  Skin:    General: Skin is warm and dry.     Comments: Small area of erythema to R lateral lower wrist, with slight swelling, small open area  without drainage. No streaking erythema noted. No purulent discharge noted.  Neurological:     Mental Status: He is alert and oriented to person, place, and time.     Comments: Grip strength 5/5 bilaterally  Psychiatric:        Behavior: Behavior  normal.        Thought Content: Thought content normal.        Judgment: Judgment normal.      Assessment and Plan:   Mark Ali was seen today for insect bite.  Diagnoses and all orders for this visit:  Insect bite, unspecified site, initial encounter  Cellulitis, unspecified cellulitis site  Other orders -     doxycycline (VIBRA-TABS) 100 MG tablet; Take 1 tablet (100 mg total) by mouth 2 (two) times daily.   No red flags on exam. After discussion, patient would like to trial doxycycline to see if this helps with swelling and tenderness. I think that this is agreeable. Recommended avoiding too many topical agents to prevent irritation to skin. Follow-up if symptoms worsen or persist.  . Reviewed expectations re: course of current medical issues. . Discussed self-management of symptoms. . Outlined signs and symptoms indicating need for more acute intervention. . Patient verbalized understanding and all questions were answered. . See orders for this visit as documented in the electronic medical record. . Patient received an After-Visit Summary.  Jarold Motto, PA-C

## 2018-06-04 ENCOUNTER — Ambulatory Visit: Payer: Self-pay | Admitting: *Deleted

## 2018-06-04 NOTE — Telephone Encounter (Signed)
Pt calling with concerns of COVID-19. Pt states that he has been experiencing shortness of breath if he thinks about it for the past several days. Pt states he would rate his shortness of breath as less than mild and states on a scale of 1-10 he would rate it at 1. At the time of triage call pt was walking around in the grocery store while talking and did not experience SOB.Pt does not have fever, cough, or other symptoms at this time. Pt voices concern over COVID and feels like he may be over thinking his symptom of shortness of breath but just wants to be sure. Pt given home care advice regarding COVID-19 testing, treatment, symptoms, isolation and general information regarding COVID-19. Pt verbalized understanding but would also like to know what PCP recommends based on his symptoms. Pt can be contacted at [phone]. Reason for Disposition . MILD difficulty breathing (e.g., minimal/no SOB at rest, SOB with walking, pulse <100) . COVID-19, questions about  Answer Assessment - Initial Assessment Questions 1. COVID-19 DIAGNOSIS: "Who made your Coronavirus (COVID-19) diagnosis?" "Was it confirmed by a positive lab test?" If not diagnosed by a HCP, ask "Are there lots of cases (community spread) where you live?" (See public health department website, if unsure)   * MAJOR community spread: high number of cases; numbers of cases are increasing; many people hospitalized.   * MINOR community spread: low number of cases; not increasing; few or no people hospitalized     No 2. ONSET: "When did the COVID-19 symptoms start?"      Several days 3. WORST SYMPTOM: "What is your worst symptom?" (e.g., cough, fever, shortness of breath, muscle aches)     Shortness of breath 4. COUGH: "How bad is the cough?"       No cough 5. FEVER: "Do you have a fever?" If so, ask: "What is your temperature, how was it measured, and when did it start?"     no 6. RESPIRATORY STATUS: "Describe your breathing?" (e.g., shortness  of breath, wheezing, unable to speak)      SOB with preparing for bed, pt denies shortness of breath with walking and doing activity. At the time of call pt was talking and walking and did not experience shortness of breath 7. BETTER-SAME-WORSE: "Are you getting better, staying the same or getting worse compared to yesterday?"  If getting worse, ask, "In what way?"     no 8. HIGH RISK DISEASE: "Do you have any chronic medical problems?" (e.g., asthma, heart or lung disease, weak immune system, etc.)     DM, HTN 9. PREGNANCY: "Is there any chance you are pregnant?" "When was your last menstrual period?"     n/a 10. OTHER SYMPTOMS: "Do you have any other symptoms?"  (e.g., runny nose, headache, sore throat, loss of smell)       No  Protocols used: CORONAVIRUS (COVID-19) DIAGNOSED OR SUSPECTED-A-AH

## 2018-06-05 NOTE — Telephone Encounter (Signed)
Maddy, schedule Webex to disucss

## 2018-06-05 NOTE — Telephone Encounter (Signed)
Patient is scheduled for 06/06/2018

## 2018-06-05 NOTE — Telephone Encounter (Signed)
FYI

## 2018-06-05 NOTE — Telephone Encounter (Signed)
Please verify scheduling- notes: Phone note screening state he is scheduled for April 7. Try to get him on early tomorrow morning- if he has worsening shortness of breath needs to let us know asap

## 2018-06-05 NOTE — Telephone Encounter (Signed)
Noted  

## 2018-06-06 ENCOUNTER — Ambulatory Visit: Payer: Medicare Other | Admitting: Family Medicine

## 2018-06-06 ENCOUNTER — Ambulatory Visit (INDEPENDENT_AMBULATORY_CARE_PROVIDER_SITE_OTHER): Payer: Medicare Other | Admitting: Family Medicine

## 2018-06-06 ENCOUNTER — Encounter: Payer: Self-pay | Admitting: Family Medicine

## 2018-06-06 VITALS — HR 67 | Temp 97.2°F | Ht 77.0 in | Wt 232.0 lb

## 2018-06-06 DIAGNOSIS — R0602 Shortness of breath: Secondary | ICD-10-CM

## 2018-06-06 DIAGNOSIS — E785 Hyperlipidemia, unspecified: Secondary | ICD-10-CM

## 2018-06-06 DIAGNOSIS — I10 Essential (primary) hypertension: Secondary | ICD-10-CM

## 2018-06-06 DIAGNOSIS — E119 Type 2 diabetes mellitus without complications: Secondary | ICD-10-CM

## 2018-06-06 DIAGNOSIS — E1169 Type 2 diabetes mellitus with other specified complication: Secondary | ICD-10-CM | POA: Diagnosis not present

## 2018-06-06 MED ORDER — SILDENAFIL CITRATE 20 MG PO TABS: ORAL_TABLET | ORAL | 5 refills | Status: DC

## 2018-06-06 MED ORDER — GLIPIZIDE 10 MG PO TABS: 10.0000 mg | ORAL_TABLET | Freq: Two times a day (BID) | ORAL | 3 refills | Status: DC

## 2018-06-06 MED ORDER — SIMVASTATIN 10 MG PO TABS: ORAL_TABLET | ORAL | 3 refills | Status: DC

## 2018-06-06 MED ORDER — METOPROLOL SUCCINATE ER 100 MG PO TB24: ORAL_TABLET | ORAL | 3 refills | Status: DC

## 2018-06-06 MED ORDER — HYDROCHLOROTHIAZIDE 25 MG PO TABS: 25.0000 mg | ORAL_TABLET | Freq: Every day | ORAL | 3 refills | Status: DC

## 2018-06-06 MED ORDER — RAMIPRIL 10 MG PO CAPS: 10.0000 mg | ORAL_CAPSULE | Freq: Every day | ORAL | 3 refills | Status: DC

## 2018-06-06 NOTE — Telephone Encounter (Signed)
Called pt and left VM to call the office.  

## 2018-06-06 NOTE — Patient Instructions (Addendum)
Health Maintenance Due  Topic Date Due  . OPHTHALMOLOGY EXAM - got pushed back due to covid 19 02/11/1961  . COLONOSCOPY Pt will call and schedule when COVID-19 better situation 02/11/1999    Video visit

## 2018-06-06 NOTE — Telephone Encounter (Signed)
Pt returned call. Changed appointment to today at 2:20, he is unable to be seen any sooner. He reports that SOB has not worsened.

## 2018-06-06 NOTE — Progress Notes (Signed)
Phone [phone]   Subjective:  Virtual visit via Video note  Our team/I connected with Mark Cunas Sr. on 06/06/18 at  2:20 PM EDT by a video enabled telemedicine application (webex) and verified that I am speaking with the correct person using two identifiers.  Location patient: Home-[facility] Location provider: [facility], office Persons participating in the virtual visit:  patient  Our team/I discussed the limitations of evaluation and management by telemedicine and the availability of in person appointments. In light of current covid-19 pandemic, patient also understands that we are trying to protect them by minimizing in office contact if at all possible.  The patient expressed consent for telemedicine visit and agreed to proceed.   ROS-no chest pain.  Did have brief period of shortness of breath and chest tightness as below- no fever chills.  No cough outside of episode below.  Past Medical History-  Patient Active Problem List   Diagnosis Date Noted  . Diabetes mellitus type II, controlled (HCC) 11/25/2006    Priority: High  . Hyperlipidemia associated with type 2 diabetes mellitus (HCC) 02/21/2017    Priority: Medium  . Essential hypertension 11/25/2006    Priority: Medium  . Erectile dysfunction 09/01/2014    Priority: Low  . Anemia, chronic disease 07/14/2009    Medications- reviewed and updated Current Outpatient Medications  Medication Sig Dispense Refill  . glipiZIDE (GLUCOTROL) 10 MG tablet Take 1 tablet (10 mg total) by mouth 2 (two) times daily before a meal. 180 tablet 3  . hydrochlorothiazide (HYDRODIURIL) 25 MG tablet Take 1 tablet (25 mg total) by mouth daily. 90 tablet 3  . metFORMIN (GLUCOPHAGE) 1000 MG tablet TAKE 1 TABLET BY MOUTH TWICE DAILY WITH A MEAL 180 tablet 3  . metoprolol succinate (TOPROL-XL) 100 MG 24 hr tablet TAKE 1 TABLET BY MOUTH DAILY. TAKE WITH OR IMMEDIATELY FOLLOWING A MEAL 90 tablet 3  . ramipril (ALTACE) 10 MG capsule Take 1  capsule (10 mg total) by mouth daily. Pt is taking 1 tablet in the morning 90 capsule 3  . sildenafil (REVATIO) 20 MG tablet Take 1-5 tablets as needed. 45 tablet 5  . simvastatin (ZOCOR) 10 MG tablet TAKE 1 TABLET(10 MG) BY MOUTH AT BEDTIME 90 tablet 3   No current facility-administered medications for this visit.      Objective:  Pulse 67   Temp (!) 97.2 F (36.2 C) (Oral)   Ht 6\' 5"  (1.956 m)   Wt 232 lb (105.2 kg)   SpO2 98%   BMI 27.51 kg/m  Gen: NAD, resting comfortably Lungs: nonlabored, normal respiratory rate  Skin: warm, dry, no obvious rash Normal speech     Assessment and Plan   #Mild shortness of breath S: Patient states he has been reading a lot on the news and he got nervous. States checked temperature and had no fever. Has no cough.   He did note perhaps mildest of shortness of rbeath and became nervous. History of seasonal allergies- particularly sneezing in the morning. One day he was clearning the shower- he had a few fumes get in his nostrils- felt somewhat winded and like he couldn't   A/P: Sounds like patient had mild shortness of breath after exposing himself to some fumes while cleaning- this has completely resolved and has had no recurrence-during our visit he was in the garage and had just been active doing some spring cleaning without any difficulty-reassurance provided about COVID-19 - We did go over potential symptoms for COVID-19 and what  to watch out for -We did go over potential conservative treatments at home - We reviewed there is no current known/recommended outpatient treatment for COVID-19 if he were to develop this-we would need to work closely together and monitoring-glad he has a pulse ox -We discussed importance of self quarantining if he did develop symptoms and getting in touch with [place].  #hypertension S: controlled on hydrochlorothiazide 25 mg, metoprolol 100 mg extended release, ramipril 10 mg BP Readings from Last 3 Encounters:   05/16/18 140/80  03/13/18 130/72  01/08/18 138/84  A/P: Was well controlled on last visit with me-slightly elevated during acute visit with Jarold Motto, PA-not clear if it was repeated that day.  Patient is going to get a home cuff and monitor blood pressures with goal of average less than 140/90  # Diabetes S: Has been controlled on glipizide 10 mg and metformin 1000 mg twice a day Lab Results  Component Value Date   HGBA1C 6.2 (A) 03/13/2018   HGBA1C 7.0 (H) 03/14/2017   HGBA1C 6.5 06/21/2016   A/P:  Stable. Continue current medications.  We discussed the importance of keeping chronic conditions stable during COVID-19/well-controlled-he fortunately is doing a good job with that  #hyperlipidemia S: Has been controlled on simvastatin 10 mg Lab Results  Component Value Date   CHOL 128 04/03/2018   HDL 36.70 (L) 04/03/2018   LDLCALC 74 04/03/2018   LDLDIRECT 123.6 03/01/2008   TRIG 86.0 04/03/2018   CHOLHDL 3 04/03/2018   A/P: Doing well-refilled simvastatin and all other medications  We will have team call to advise 4-101-month follow-up  Lab/Order associations: Mild shortness of breath  Controlled type 2 diabetes mellitus without complication, without long-term current use of insulin (HCC)  Hyperlipidemia associated with type 2 diabetes mellitus (HCC)  Essential hypertension  Meds ordered this encounter  Medications  . glipiZIDE (GLUCOTROL) 10 MG tablet    Sig: Take 1 tablet (10 mg total) by mouth 2 (two) times daily before a meal.    Dispense:  180 tablet    Refill:  3  . hydrochlorothiazide (HYDRODIURIL) 25 MG tablet    Sig: Take 1 tablet (25 mg total) by mouth daily.    Dispense:  90 tablet    Refill:  3  . metoprolol succinate (TOPROL-XL) 100 MG 24 hr tablet    Sig: TAKE 1 TABLET BY MOUTH DAILY. TAKE WITH OR IMMEDIATELY FOLLOWING A MEAL    Dispense:  90 tablet    Refill:  3  . ramipril (ALTACE) 10 MG capsule    Sig: Take 1 capsule (10 mg total) by mouth  daily. Pt is taking 1 tablet in the morning    Dispense:  90 capsule    Refill:  3  . simvastatin (ZOCOR) 10 MG tablet    Sig: TAKE 1 TABLET(10 MG) BY MOUTH AT BEDTIME    Dispense:  90 tablet    Refill:  3  . sildenafil (REVATIO) 20 MG tablet    Sig: Take 1-5 tablets as needed.    Dispense:  45 tablet    Refill:  5    Return precautions advised.  Tana Conch, MD

## 2018-06-10 ENCOUNTER — Ambulatory Visit: Payer: Medicare Other | Admitting: Family Medicine

## 2018-06-16 ENCOUNTER — Telehealth: Payer: Self-pay | Admitting: Family Medicine

## 2018-06-16 DIAGNOSIS — N529 Male erectile dysfunction, unspecified: Secondary | ICD-10-CM

## 2018-06-16 NOTE — Telephone Encounter (Signed)
If you look on medication history-I have refilled 50 mg sildenafil before- if 50 mg is his preference-you may fill that and take off the 20 mg dose.  Take 1 to 2 tablets as needed for erectile dysfunction up to every 72 hours.  #10 or 20 per his preference with 5 refills.

## 2018-06-16 NOTE — Telephone Encounter (Signed)
Copied from CRM [id number]. Topic: Quick Communication - Rx Refill/Question >> Jun 16, 2018  2:28 PM Jay Schlichter wrote: Medication: 50 mg sildenafil   Has the patient contacted their pharmacy? Yes - wrong dose  (Agent: If no, request that the patient contact the pharmacy for the refill.) (Agent: If yes, when and what did the pharmacy advise?)  Preferred Pharmacy (with phone number or street name): [facility] [address]  Pt needs the 50 mg filled   Agent: Please be advised that RX refills may take up to 3 business days. We ask that you follow-up with your pharmacy.

## 2018-06-16 NOTE — Telephone Encounter (Signed)
See note

## 2018-06-16 NOTE — Telephone Encounter (Signed)
Spoke to pt and he stated that he has been taking 50 mg and 20 mg of the Sildenafil. However, I don't see where Dr. Durene Cal has refilled the 50 mg.   Please advise

## 2018-06-17 ENCOUNTER — Other Ambulatory Visit: Payer: Self-pay

## 2018-06-17 MED ORDER — SILDENAFIL CITRATE 50 MG PO TABS: ORAL_TABLET | ORAL | 5 refills | Status: DC

## 2018-06-17 NOTE — Telephone Encounter (Signed)
Spoke to pt and he is wanting to stay with the 50 mg strength. Rx sent in 20 mg discontinued.

## 2018-06-17 NOTE — Telephone Encounter (Signed)
Called pt to update no answer will call back shortly.

## 2018-07-06 ENCOUNTER — Other Ambulatory Visit: Payer: Self-pay | Admitting: Family Medicine

## 2018-08-11 ENCOUNTER — Other Ambulatory Visit: Payer: Self-pay

## 2018-08-11 ENCOUNTER — Ambulatory Visit: Payer: Medicare Other | Admitting: Podiatry

## 2018-08-11 VITALS — Temp 97.9°F

## 2018-08-11 DIAGNOSIS — L989 Disorder of the skin and subcutaneous tissue, unspecified: Secondary | ICD-10-CM

## 2018-08-17 NOTE — Progress Notes (Signed)
   Subjective: 68 year old male with PMHx of T2DM presenting today for follow up evaluation of painful callus lesions noted to the bilateral feet. Walking and bearing weight increases the pain. He has not done anything for treatment of the symptoms. Patient is here for further evaluation and treatment.  Past Medical History:  Diagnosis Date  . Diabetes type 2, controlled ([place])   . Hyperlipidemia   . Hypertension   . UNSPECIFIED TACHYCARDIA 05/06/2007   Listed 05/06/2007- do not see recurrent tachycardia since that time    Objective:  Physical Exam General: Alert and oriented x3 in no acute distress  Dermatology: Hyperkeratotic lesions present on the bilateral feet. Pain on palpation with a central nucleated core noted.  Skin is warm, dry and supple bilateral lower extremities. Negative for open lesions or macerations.  Vascular: Palpable pedal pulses bilaterally. No edema or erythema noted. Capillary refill within normal limits.  Neurological: Epicritic and protective threshold diminished bilaterally.   Musculoskeletal Exam: Pain on palpation at the keratotic lesion noted. Range of motion within normal limits bilateral. Muscle strength 5/5 in all groups bilateral.  Assessment: #1 Diabetes mellitus w/ peripheral neuropathy #2 Pre-ulcerative callus lesion noted to bilateral feet x 2   Plan of Care:  #1 Patient evaluated #2 Excisional debridement of keratotic lesions using a chisel blade was performed without incident.  #3 Dressed area with light dressing. #4 Recommended good shoe gear.  #5 Patient is to return to the clinic PRN.    Edrick Kins, DPM [facility]  Dr. Edrick Kins, [place]                                        [place], [place] [address]                Office [phone]  Fax [phone]

## 2018-10-31 ENCOUNTER — Other Ambulatory Visit: Payer: Self-pay

## 2018-10-31 ENCOUNTER — Encounter: Payer: Self-pay | Admitting: Family Medicine

## 2018-10-31 ENCOUNTER — Ambulatory Visit (INDEPENDENT_AMBULATORY_CARE_PROVIDER_SITE_OTHER): Payer: Medicare Other

## 2018-10-31 DIAGNOSIS — Z23 Encounter for immunization: Secondary | ICD-10-CM

## 2018-11-18 ENCOUNTER — Encounter: Payer: Self-pay | Admitting: Family Medicine

## 2018-12-11 ENCOUNTER — Telehealth: Payer: Self-pay | Admitting: Family Medicine

## 2018-12-11 NOTE — Telephone Encounter (Signed)
I called the patient to schedule AWV with Loma Sousa and OV with Dr. Yong Channel in November. The patient was busy and said that he will call back.  If pt calls back, please try to schedule on 01/16/2019 at 1:00 with Loma Sousa (health coach) and 1:40 w/ Dr. Yong Channel if times are still available.

## 2019-02-20 ENCOUNTER — Other Ambulatory Visit: Payer: Self-pay | Admitting: Family Medicine

## 2019-02-20 DIAGNOSIS — E139 Other specified diabetes mellitus without complications: Secondary | ICD-10-CM

## 2019-03-09 ENCOUNTER — Telehealth: Payer: Self-pay | Admitting: Family Medicine

## 2019-03-09 NOTE — Telephone Encounter (Signed)
I called the patient to schedule AWV for both him and his wife.  He said that he would call back to schedule it because they were both on zoom calls right now. Last AWV 01/08/18

## 2019-05-21 ENCOUNTER — Other Ambulatory Visit: Payer: Self-pay | Admitting: Family Medicine

## 2019-06-01 ENCOUNTER — Telehealth: Payer: Self-pay | Admitting: Podiatry

## 2019-06-01 NOTE — Telephone Encounter (Signed)
Pt called stated that he has his toes on the lft foot great toe and 2nd toe keep rubbing and creating blister// would like advice on what he can use to help with the rubbing. Please advise

## 2019-06-01 NOTE — Telephone Encounter (Signed)
I spoke with pt and informed that he should wear wider shoes, pad the areas off with silicone shield or cap, if open wound cover with a lightly coated neosporin bandaid and put the shield or cap on the opposing toe. Pt states he fills better now that what he is doing is good until seen in office.

## 2019-06-24 ENCOUNTER — Other Ambulatory Visit: Payer: Self-pay

## 2019-06-24 ENCOUNTER — Ambulatory Visit: Payer: Medicare PPO | Admitting: Podiatry

## 2019-06-24 VITALS — Temp 97.2°F

## 2019-06-24 DIAGNOSIS — L989 Disorder of the skin and subcutaneous tissue, unspecified: Secondary | ICD-10-CM

## 2019-06-24 DIAGNOSIS — M2042 Other hammer toe(s) (acquired), left foot: Secondary | ICD-10-CM

## 2019-06-24 DIAGNOSIS — M21612 Bunion of left foot: Secondary | ICD-10-CM

## 2019-06-29 NOTE — Progress Notes (Signed)
   Subjective: 69-year-old male with PMHx of T2DM presenting today for follow up evaluation of painful callus lesions noted to the bilateral feet. He also reports continued pain from a hammertoe of the left 2nd digit. He states the toe rubs against the great toe increases the pain. Walking and bearing weight increases the pain from the callus lesions. He has been using bandages, soaks and applying Neosporin for treatment. Patient is here for further evaluation and treatment.  Past Medical History:  Diagnosis Date   Diabetes type 2, controlled (HCC)    Hyperlipidemia    Hypertension    UNSPECIFIED TACHYCARDIA 05/06/2007   Listed 05/06/2007- do not see recurrent tachycardia since that time    Objective:  Physical Exam General: Alert and oriented x3 in no acute distress  Dermatology: Hyperkeratotic lesions present on the bilateral feet. Pain on palpation with a central nucleated core noted.  Skin is warm, dry and supple bilateral lower extremities. Negative for open lesions or macerations.  Vascular: Palpable pedal pulses bilaterally. No edema or erythema noted. Capillary refill within normal limits.  Neurological: Epicritic and protective threshold diminished bilaterally.   Musculoskeletal Exam: Pain on palpation at the keratotic lesion noted. Range of motion within normal limits bilateral. Muscle strength 5/5 in all groups bilateral. Clinical evidence of bunion deformity noted to the respective foot. There is moderate pain on palpation range of motion of the first MPJ. Lateral deviation of the hallux noted consistent with hallux abductovalgus. Hammertoe contracture also noted on clinical exam to the 2nd toe of the left foot. Symptomatic pain on palpation and range of motion also noted to the metatarsal phalangeal joints of the respective hammertoe digits.    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  #3 Silicone toe caps dispensed.  #4 Recommended good shoe gear.  #5 Return to clinic as needed for surgical consult.     Mera Gunkel M. Thia Olesen, DPM [facility]  Dr. Mitchel Delduca M. Inetha Maret, DPM    [address]                                        [place], [place] [address]                Office [phone]  Fax [phone]      

## 2019-09-26 ENCOUNTER — Ambulatory Visit (HOSPITAL_COMMUNITY)
Admission: EM | Admit: 2019-09-26 | Discharge: 2019-09-26 | Disposition: A | Discharge status: Home / Self Care | Payer: Medicare PPO | Attending: Emergency Medicine | Admitting: Emergency Medicine

## 2019-09-26 ENCOUNTER — Encounter (HOSPITAL_COMMUNITY): Payer: Self-pay

## 2019-09-26 DIAGNOSIS — M545 Low back pain: Secondary | ICD-10-CM

## 2019-09-26 DIAGNOSIS — M5441 Lumbago with sciatica, right side: Secondary | ICD-10-CM

## 2019-09-26 LAB — POCT URINALYSIS DIP (DEVICE)
Bilirubin Urine: NEGATIVE
Glucose, UA: 100 mg/dL — AB
Hgb urine dipstick: NEGATIVE
Leukocytes,Ua: NEGATIVE
Nitrite: NEGATIVE
Protein, ur: NEGATIVE mg/dL
Specific Gravity, Urine: >=1.03 (ref 1.005–1.030)
Urobilinogen, UA: 1 mg/dL (ref 0.0–1.0)
pH: 5.5 (ref 5.0–8.0)

## 2019-09-26 MED ORDER — KETOROLAC TROMETHAMINE 30 MG/ML IJ SOLN
30.0000 mg | Freq: Once | INTRAMUSCULAR | Status: AC
  Administered 2019-09-26: 30 mg via INTRAMUSCULAR

## 2019-09-26 MED ORDER — KETOROLAC TROMETHAMINE 30 MG/ML IJ SOLN
INTRAMUSCULAR | Status: AC
  Filled 2019-09-26: qty 1

## 2019-09-26 MED ORDER — TIZANIDINE HCL 4 MG PO TABS: 4.0000 mg | ORAL_TABLET | Freq: Four times a day (QID) | ORAL | 0 refills | Status: DC | PRN

## 2019-09-26 MED ORDER — DICLOFENAC SODIUM 50 MG PO TBEC: 50.0000 mg | DELAYED_RELEASE_TABLET | Freq: Two times a day (BID) | ORAL | 0 refills | Status: DC

## 2019-09-26 NOTE — ED Triage Notes (Signed)
Pt present back pain on the right side. Symptoms started over a week ago. Pt has been doing some heavy lifting and not sure if he pulled a muscle.

## 2019-09-26 NOTE — Discharge Instructions (Signed)
We gave you an injection of toradol Diclofenac twice daily for the next 7-10 days You may use tizanidine as needed to help with pain. This is a muscle relaxer and causes sedation- please use only at bedtime or when you will be home and not have to drive/work Follow up if not improving

## 2019-09-27 NOTE — ED Provider Notes (Signed)
[facility]    CSN: [id number] Arrival date & time: 09/26/19  1411      History   Chief Complaint Chief Complaint  Patient presents with  . Back Pain    right side     HPI Mark Todisco Sr. is a 69 y.o. male history of hypertension, hyperlipidemia, DM type II presenting today for evaluation of right-sided back pain.  Patient states symptoms began approximately 1 week ago after doing some heavy lifting with helping his son move.  Since he has had pain with certain bending and twisting motions.  Typically will have back pain that will resolve after few days, pain this time has been more persistent than normal.  He is also concerned as he has noticed some urinary frequency.  Reports occasional pain radiating into groin/testicle area.  Denies hematuria, dysuria.  Denies hesitancy.  Denies nausea or vomiting.  Has been using Aleve for symptoms.  HPI  Past Medical History:  Diagnosis Date  . Diabetes type 2, controlled (HCC)   . Hyperlipidemia   . Hypertension   . UNSPECIFIED TACHYCARDIA 05/06/2007   Listed 05/06/2007- do not see recurrent tachycardia since that time     Patient Active Problem List   Diagnosis Date Noted  . Hyperlipidemia associated with type 2 diabetes mellitus (HCC) 02/21/2017  . Erectile dysfunction 09/01/2014  . Anemia, chronic disease 07/14/2009  . Diabetes mellitus type II, controlled (HCC) 11/25/2006  . Essential hypertension 11/25/2006    Past Surgical History:  Procedure Laterality Date  . MULTIPLE TOOTH EXTRACTIONS         Home Medications    Prior to Admission medications   Medication Sig Start Date End Date Taking? Authorizing Provider  diclofenac (VOLTAREN) 50 MG EC tablet Take 1 tablet (50 mg total) by mouth 2 (two) times daily. 09/26/19   Corwyn Vora C, PA-C  glipiZIDE (GLUCOTROL) 10 MG tablet TAKE 1 TABLET (10 MG TOTAL) BY MOUTH 2 (TWO) TIMES DAILY BEFORE A MEAL. 05/21/19   Shelva Majestic, MD  hydrochlorothiazide  (HYDRODIURIL) 25 MG tablet TAKE 1 TABLET BY MOUTH EVERY DAY 05/21/19   Shelva Majestic, MD  metFORMIN (GLUCOPHAGE) 1000 MG tablet TAKE 1 TABLET BY MOUTH TWICE DAILY WITH A MEAL 02/23/19   Shelva Majestic, MD  metoprolol succinate (TOPROL-XL) 100 MG 24 hr tablet TAKE 1 TABLET BY MOUTH DAILY. TAKE WITH OR IMMEDIATELY FOLLOWING A MEAL 05/21/19   Shelva Majestic, MD  ramipril (ALTACE) 10 MG capsule TAKE 1 CAPSULE (10 MG TOTAL) BY MOUTH DAILY 05/21/19   Shelva Majestic, MD  sildenafil (VIAGRA) 50 MG tablet Take 1 to 2 tablets as needed for erectile dysfunction up to every 72 hours 06/17/18   Shelva Majestic, MD  simvastatin (ZOCOR) 10 MG tablet TAKE 1 TABLET(10 MG) BY MOUTH AT BEDTIME 05/21/19   Shelva Majestic, MD  tiZANidine (ZANAFLEX) 4 MG tablet Take 1 tablet (4 mg total) by mouth every 6 (six) hours as needed for muscle spasms. 09/26/19   Shaarav Ripple, Junius Creamer, PA-C    Family History Family History  Problem Relation Age of Onset  . Diabetes Other   . Kidney disease Other   . Other Mother        respiratory failure died in 1s  . Alcohol abuse Father   . Lung disease Father        smoking  . Heart disease Sister        heart attack 64  . Alcohol abuse  Brother   . Cirrhosis Brother   . Kidney disease Sister   . COPD Sister        led to death. procedural complication  . Diabetes Brother     Social History Social History   Tobacco Use  . Smoking status: Never Smoker  . Smokeless tobacco: Never Used  Vaping Use  . Vaping Use: Never used  Substance Use Topics  . Alcohol use: No  . Drug use: No     Allergies   Patient has no known allergies.   Review of Systems Review of Systems  Constitutional: Negative for fever.  HENT: Negative for sore throat.   Respiratory: Negative for shortness of breath.   Cardiovascular: Negative for chest pain.  Gastrointestinal: Negative for abdominal pain, nausea and vomiting.  Genitourinary: Positive for frequency. Negative for  difficulty urinating, discharge, dysuria, penile pain, penile swelling, scrotal swelling and testicular pain.  Musculoskeletal: Positive for back pain and myalgias.  Skin: Negative for rash.  Neurological: Negative for dizziness, light-headedness and headaches.     Physical Exam Triage Vital Signs ED Triage Vitals  Enc Vitals Group     BP 09/26/19 1452 (!) 151/77     Pulse Rate 09/26/19 1452 76     Resp 09/26/19 1452 18     Temp 09/26/19 1452 98.1 F (36.7 C)     Temp Source 09/26/19 1452 Oral     SpO2 09/26/19 1452 99 %     Weight --      Height --      Head Circumference --      Peak Flow --      Pain Score 09/26/19 1453 6     Pain Loc --      Pain Edu? --      Excl. in GC? --    No data found.  Updated Vital Signs BP (!) 151/77 (BP Location: Right Arm)   Pulse 76   Temp 98.1 F (36.7 C) (Oral)   Resp 18   SpO2 99%   Visual Acuity Right Eye Distance:   Left Eye Distance:   Bilateral Distance:    Right Eye Near:   Left Eye Near:    Bilateral Near:     Physical Exam Vitals and nursing note reviewed.  Constitutional:      Appearance: He is well-developed.     Comments: No acute distress  HENT:     Head: Normocephalic and atraumatic.     Nose: Nose normal.  Eyes:     Conjunctiva/sclera: Conjunctivae normal.  Cardiovascular:     Rate and Rhythm: Normal rate.  Pulmonary:     Effort: Pulmonary effort is normal. No respiratory distress.  Abdominal:     General: There is no distension.  Musculoskeletal:        General: Normal range of motion.     Cervical back: Neck supple.     Comments: Nontender palpation of thoracic and lumbar spine midline, increased tenderness throughout lateral lumbar musculature on right  Strength at hips and knees 5/5 ankle bilaterally, patellar reflex 1+ bilaterally, pain elicited with resisted hip flexion  Skin:    General: Skin is warm and dry.  Neurological:     Mental Status: He is alert and oriented to person, place, and  time.      UC Treatments / Results  Labs (all labs ordered are listed, but only abnormal results are displayed) Labs Reviewed  POCT URINALYSIS DIP (DEVICE) - Abnormal; Notable for the following components:  Result Value   Glucose, UA 100 (*)    Ketones, ur TRACE (*)    All other components within normal limits    EKG   Radiology No results found.  Procedures Procedures (including critical care time)  Medications Ordered in UC Medications  ketorolac (TORADOL) 30 MG/ML injection 30 mg (30 mg Intramuscular Given 09/26/19 1557)    Initial Impression / Assessment and Plan / UC Course  I have reviewed the triage vital signs and the nursing notes.  Pertinent labs & imaging results that were available during my care of the patient were reviewed by me and considered in my medical decision making (see chart for details).     UA without hemoglobin, negative leuks and nitrites, suggestive of UTI/stone at this time.  Does have glucose which may explain urinary frequency.  Discussed hydration and close monitoring of sugars.  Suspect back pain likely muscular strain/mechanical in nature from recent heavy lifting.  Recommend continuing anti-inflammatories, adding in muscle relaxers.  Will provide diclofenac as alternative NSAID to Aleve, deferring further steroid therapy at this time.  Activity modification.  Discussed strict return precautions. Patient verbalized understanding and is agreeable with plan.  Final Clinical Impressions(s) / UC Diagnoses   Final diagnoses:  Acute right-sided low back pain with right-sided sciatica     Discharge Instructions     We gave you an injection of toradol Diclofenac twice daily for the next 7-10 days You may use tizanidine as needed to help with pain. This is a muscle relaxer and causes sedation- please use only at bedtime or when you will be home and not have to drive/work Follow up if not improving   ED Prescriptions    Medication  Sig Dispense Auth. Provider   diclofenac (VOLTAREN) 50 MG EC tablet Take 1 tablet (50 mg total) by mouth 2 (two) times daily. 30 tablet Ricardo Schubach C, PA-C   tiZANidine (ZANAFLEX) 4 MG tablet Take 1 tablet (4 mg total) by mouth every 6 (six) hours as needed for muscle spasms. 30 tablet Abagael Kramm, [place] C, PA-C     PDMP not reviewed this encounter.   Lew Dawes, [place] 09/27/19 [phone]

## 2019-09-28 ENCOUNTER — Telehealth: Payer: Self-pay | Admitting: Family Medicine

## 2019-09-28 NOTE — Telephone Encounter (Signed)
Noted  

## 2019-09-28 NOTE — Telephone Encounter (Signed)
Patient was seen in ED.  Nurse Assessment Nurse: Lily Kocher, RN, Adriana Date/Time (Eastern Time): 09/25/2019 7:03:28 PM Confirm and document reason for call. If symptomatic, describe symptoms. ---pt states that he is experiencing lower back pain that is intermittent. states he feels like sometimes he urinates more than he should. has been ongoing for about 1 week. no discomfort with urination, but has a sensation after urinating "like tingling", no foul smell, no blood in urine. Has the patient had close contact with a person known or suspected to have the novel coronavirus illness OR traveled / lives in area with major community spread (including international travel) in the last 14 days from the onset of symptoms? * If Asymptomatic, screen for exposure and travel within the last 14 days. ---No Does the patient have any new or worsening symptoms? ---Yes Will a triage be completed? ---Yes Related visit to physician within the last 2 weeks? ---No Does the PT have any chronic conditions? (i.e. diabetes, asthma, this includes High risk factors for pregnancy, etc.) ---Yes List chronic conditions. ---htn diabetes Is this a behavioral health or substance abuse call? ---NoPLEASE NOTE: All timestamps contained within this report are represented as [place] Standard Time. CONFIDENTIALTY NOTICE: This fax transmission is intended only for the addressee. It contains information that is legally privileged, confidential or otherwise protected from use or disclosure. If you are not the intended recipient, you are strictly prohibited from reviewing, disclosing, copying using or disseminating any of this information or taking any action in reliance on or regarding this information. If you have received this fax in error, please notify us immediately by telephone so that we can arrange for its return to [place]. Phone: [phone], Toll-Free: [phone], Fax: [phone] Page: 2 of 2 Call Id:  [id number] Guidelines Guideline Title Affirmed Question Affirmed Notes Nurse Date/Time Lamount Cohen Time) Urinary Symptoms Side (flank) or lower back pain present Maryelizabeth Rowan, Ricki Rodriguez 09/25/2019 7:06:25 PM Disp. Time Lamount Cohen Time) Disposition Final User 09/25/2019 7:18:22 PM See PCP within 24 Hours Yes Lily Kocher, RN, Ricki Rodriguez Caller Disagree/Comply Comply Caller Understands Yes PreDisposition Call Doctor Care Advice Given Per Guideline SEE PCP WITHIN 24 HOURS: * IF OFFICE WILL BE CLOSED: You need to be seen within the next 24 hours. A clinic or an urgent care center is often a good source of care if your doctor's office is closed or you can't get an appointment. CALL BACK IF: * Fever occurs * Unable to urinate and bladder feels full * You become worse. CARE ADVICE given per Urinary Symptoms (Adult) guideline. Comments User: Jerilynn Birkenhead, RN Date/Time Lamount Cohen Time): 09/25/2019 7:09:28 PM pt is OOT Referrals GO TO FACILITY UNDECIDED

## 2019-10-12 NOTE — Progress Notes (Signed)
Phone: [phone]   Subjective:  Patient presents today for their annual physical. Chief complaint-noted.   See problem oriented charting- Review of Systems  Constitutional: Negative.   HENT: Negative.   Eyes: Negative.   Respiratory: Negative.   Cardiovascular: Negative.   Gastrointestinal: Negative.   Genitourinary: Negative.   Musculoskeletal: Positive for back pain.  Skin: Negative.   Neurological: Negative.   Endo/Heme/Allergies: Negative.   Psychiatric/Behavioral: Negative.    The following were reviewed and entered/updated in epic: Past Medical History:  Diagnosis Date  . Diabetes type 2, controlled (HCC)   . Hyperlipidemia   . Hypertension   . UNSPECIFIED TACHYCARDIA 05/06/2007   Listed 05/06/2007- do not see recurrent tachycardia since that time    Patient Active Problem List   Diagnosis Date Noted  . Diabetes mellitus type II, controlled (HCC) 11/25/2006    Priority: High  . Hyperlipidemia associated with type 2 diabetes mellitus (HCC) 02/21/2017    Priority: Medium  . Essential hypertension 11/25/2006    Priority: Medium  . Erectile dysfunction 09/01/2014    Priority: Low  . Anemia, chronic disease 07/14/2009   Past Surgical History:  Procedure Laterality Date  . MULTIPLE TOOTH EXTRACTIONS      Family History  Problem Relation Age of Onset  . Diabetes Other   . Kidney disease Other   . Other Mother        respiratory failure died in 38s  . Alcohol abuse Father   . Lung disease Father        smoking  . Heart disease Sister        heart attack 68  . Alcohol abuse Brother   . Cirrhosis Brother   . Kidney disease Sister   . COPD Sister        led to death. procedural complication  . Diabetes Brother     Medications- reviewed and updated Current Outpatient Medications  Medication Sig Dispense Refill  . glipiZIDE (GLUCOTROL) 10 MG tablet Take 1 tablet (10 mg total) by mouth 2 (two) times daily before a meal. 180 tablet 3  . hydrochlorothiazide  (HYDRODIURIL) 25 MG tablet Take 1 tablet (25 mg total) by mouth daily. 90 tablet 3  . metFORMIN (GLUCOPHAGE) 1000 MG tablet TAKE 1 TABLET BY MOUTH TWICE DAILY WITH A MEAL 180 tablet 3  . metoprolol succinate (TOPROL-XL) 100 MG 24 hr tablet Take with or immediately following a meal. 90 tablet 3  . ramipril (ALTACE) 10 MG capsule TAKE 1 CAPSULE (10 MG TOTAL) BY MOUTH DAILY 90 capsule 3  . sildenafil (VIAGRA) 100 MG tablet Take 1 tablet as needed for erectile dysfunction up to every 72 hours 10 tablet 5  . simvastatin (ZOCOR) 10 MG tablet Daily 90 tablet 3  . tiZANidine (ZANAFLEX) 4 MG tablet Take 1 tablet (4 mg total) by mouth every 6 (six) hours as needed for muscle spasms (do not drive for 6 hours after taking). 30 tablet 0   No current facility-administered medications for this visit.    Allergies-reviewed and updated No Known Allergies  Social History   Social History Narrative   Married 3 children. Wife and son patient here. 2 grandkids.       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  [employer] under[employer]   1978 [employer]- 4 degrees through 2000- [employer]lish/language arts, math. Grad Print production plannerdegree educational admin, doctorate - Building surveyoreducational leadership   [employer] for 2 years. W[employer] - 2 months.       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    Objective     BP 136/76   Pulse 63   Temp 98 F (36.7 C)   Ht 6\' 5"  (1.956 m)   Wt 227 lb (103 kg)   SpO2 98%   BMI 26.92 kg/m  Gen: NAD, resting comfortably HEENT: Mucous membranes are moist. Oropharynx normal Neck: no thyromegaly CV: RRR no murmurs rubs or gallops Lungs: CTAB no crackles, wheeze,  rhonchi Abdomen: soft/nontender/nondistended/normal bowel sounds. No rebound or guarding.  Ext: no edema Skin: warm, dry Neuro: grossly normal, moves all extremities, PERRLA      Diabetic Foot Exam - Simple   Simple Foot Form Diabetic Foot exam was performed with the following findings: Yes 10/15/2019 10:03 AM  Visual Inspection No deformities, no ulcerations, no other skin breakdown bilaterally: Yes Sensation Testing Intact to touch and monofilament testing bilaterally: Yes Pulse Check Posterior Tibialis and Dorsalis pulse intact bilaterally: Yes Comments       Assessment and Plan  69 y.o. male presenting for annual physical.  Health Maintenance counseling: 1. Anticipatory guidance: Patient counseled regarding regular dental exams q6 months, eye exams yearly,  avoiding smoking and second hand smoke , limiting alcohol to 2 beverages per day- does not drink  2. Risk factor reduction:  Advised patient of need for regular exercise and diet rich and fruits and vegetables to reduce risk of heart attack and stroke. Exercise- goes to gym regularly at least 2-3 days a week. Diet-down 5 lbs from last year. Feels eats a reasonably healthy diet- tries to limit bread intake for instance Wt Readings from Last 3 Encounters:  10/15/19 227 lb (103 kg)  06/06/18 232 lb (105.2 kg)  05/16/18 232 lb 4 oz (105.3 kg)  3. Immunizations/screenings/ancillary studies-recommended flu shot in the fall, discussed Shingrix, vaccinated for COVID-19 and will send us the dates[place] [facility] [place]  Administered Date(s) Administered  . Fluad Quad(high Dose 65+) 10/31/2018  . Influenza Split 03/05/2011  . Influenza Whole 03/05/2005  . Influenza, High Dose Seasonal PF 02/21/2017, 01/08/2018  . Influenza,inj,Quad PF,6+ Mos 04/29/2014  . Pneumococcal Conjugate-13 09/01/2014  . Pneumococcal Polysaccharide-23 03/05/2006, 02/02/2016  . Td 03/06/1999  . Tdap 03/05/2011  . Zoster 09/03/2014    Health Maintenance Due  Topic Date Due  . OPHTHALMOLOGY EXAM -have them send us a copy after exam next week- [facility] Never done  . COVID-19 Vaccine (1)-will get us the information on dates Never done  . COLONOSCOPY -needs to schedule  Never done   4. Prostate cancer screening- low risk prior PSA trend-update PSA with labs Lab Results  Component Value Date   PSA 0.56 04/03/2018   PSA 0.58 03/14/2017   PSA 0.50 01/25/2016   5. Colon cancer screening - refer today for colonoscopy 6. Skin cancer screening-Lower risk due to melanin content. advised regular sunscreen use. Denies worrisome, changing, or new skin lesions.  7. Never smoker 8. STD screening - opts out- married.   Status of chronic or acute concerns   #hypertension S: medication: HCTZ 25Mg , Metoprolol 100Mg , Ramipril 10Mg  BP Readings from Last 3 Encounters:  10/15/19 136/76  09/26/19 (!) 151/77  05/16/18 140/80  A/P: blood pressure hair high but could be related to back  pain. I want him to monitor over next 2-4 weeks and update me on mychart with readings from home. I suspect this will improve- continue current meds for now -better on repeat today- can do intermittent monitoring   #hyperlipidemia S: Medication: Simvastatin 10Mg  Lab Results  Component Value Date   CHOL 128 04/03/2018   HDL 36.70 (L) 04/03/2018   LDLCALC 74 04/03/2018   LDLDIRECT 123.6 03/01/2008   TRIG 86.0 04/03/2018   CHOLHDL 3 04/03/2018   A/P: hopefully controlled- update lipid panel  # Diabetes- would prefer pills.  S: Medication: Glipizide 10Mg  BID, Metformin 1000Mg  BID CBGs- 70s to 100. No lows below 70 Exercise and diet- see above Lab Results  Component Value Date   HGBA1C 6.2 (A) 03/13/2018   HGBA1C 7.0 (H) 03/14/2017   HGBA1C 6.5 06/21/2016   A/P: hopefully controlled- update a1c  # erectile dysfunction-uses sildenafil as needed   #Right low back pain-seen in the emergency department 09/26/2019.  Given short-term Voltaren and  tizanidine. States hurt down into his right groin- urine testing was normal. He states feeling much better now.    Recommended follow up: Return in about 4 months (around 02/14/2020) for follow up- or sooner if needed.  Lab/Order associations: fasting   ICD-10-CM   1. Preventative health care  Z00.00 CBC with Differential/Platelet    Comprehensive metabolic panel    Lipid panel    PSA    Hemoglobin A1c    Ambulatory referral to Gastroenterology  2. Essential hypertension  I10 CBC with Differential/Platelet    Comprehensive metabolic panel  3. Controlled type 2 diabetes mellitus without complication, without long-term current use of insulin (HCC)  E11.9 Hemoglobin A1c  4. Hyperlipidemia associated with type 2 diabetes mellitus (HCC)  E11.69 Lipid panel   E78.5   5. Screening for prostate cancer  Z12.5 PSA  6. Screen for colon cancer  Z12.11 Ambulatory referral to Gastroenterology  7. 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 sildenafil (VIAGRA) 100 MG tablet    Sig: Take 1 tablet as needed for erectile dysfunction up to every 72 hours    Dispense:  10 tablet    Refill:  5  . glipiZIDE (GLUCOTROL) 10 MG tablet    Sig: Take 1 tablet (10 mg total) by mouth 2 (two) times daily before a meal.    Dispense:  180 tablet    Refill:  3  . hydrochlorothiazide (HYDRODIURIL) 25 MG tablet    Sig: Take 1 tablet (25 mg total) by mouth daily.    Dispense:  90 tablet    Refill:  3  . metFORMIN (GLUCOPHAGE) 1000 MG tablet    Sig: TAKE 1 TABLET BY MOUTH TWICE DAILY WITH A MEAL    Dispense:  180 tablet    Refill:  3  . metoprolol succinate (TOPROL-XL) 100 MG 24 hr tablet    Sig: Take with or immediately following a meal.    Dispense:  90 tablet    Refill:  3  . ramipril (ALTACE) 10 MG capsule    Sig:  TAKE 1 CAPSULE (10 MG TOTAL) BY MOUTH DAILY    Dispense:  90 capsule    Refill:  3  . simvastatin (ZOCOR) 10 MG tablet    Sig: Daily    Dispense:  90 tablet  Refill:  3  . tiZANidine (ZANAFLEX) 4 MG tablet    Sig: Take 1 tablet (4 mg total) by mouth every 6 (six) hours as needed for muscle spasms (do not drive for 6 hours after taking).    Dispense:  30 tablet    Refill:  0    Return precautions advised.  Tana Conch, MD

## 2019-10-12 NOTE — Patient Instructions (Addendum)
Health Maintenance Due  Topic Date Due  . OPHTHALMOLOGY EXAM -have them send [place] a copy after exam next week Never done  . COVID-19 Vaccine (1)-will get [place] the information on dates Never done  . COLONOSCOPY -needs to schedule Never done    Please check with your pharmacy to see if they have the shingrix vaccine for shingles prevention. If they do- please get this immunization and update [place] by phone call or mychart with dates you receive the vaccine  We will call you within two weeks about your referral to GI for colonoscopy. If you do not hear within 3 weeks, give [place] a call.   blood pressure hair high but could be related to back pain. Improved on recheck. Check intermittently at home with goal <140/90  Mineral oil for ear full of wax Purchase mineral oil from laxative aisle Lay down on your side with ear that is bothering you facing up Use 3-4 drops with a dropper and place in ear for 30 seconds Place cotton swab outside of ear Turn to other side and allow this to drain Repeat 3-4 x a day Return to see [place] if not improving within a few days   Mark Ali , Thank you for taking time to come for your Medicare Wellness Visit. I appreciate your ongoing commitment to your health goals. Please review the following plan we discussed and let me know if I can assist you in the future.   These are the goals we discussed: 1. Continue to exercise- goal 150 minutes a week 2. Maintain a1c und 7.0    This is a list of the screening recommended for you and due dates:  Health Maintenance  Topic Date Due  . Eye exam for diabetics  Never done  . COVID-19 Vaccine (1) Never done  . Colon Cancer Screening  Never done  . Hemoglobin A1C  09/11/2018  . Flu Shot  10/04/2019  . Complete foot exam   10/14/2020  . Tetanus Vaccine  03/04/2021  .  Hepatitis C: One time screening is recommended by Center for Disease Control  (CDC) for  adults born from [birth year] through 1967.   Completed  . Pneumonia vaccines   Completed

## 2019-10-15 ENCOUNTER — Other Ambulatory Visit: Payer: Self-pay

## 2019-10-15 ENCOUNTER — Ambulatory Visit (INDEPENDENT_AMBULATORY_CARE_PROVIDER_SITE_OTHER): Payer: Medicare PPO | Admitting: Family Medicine

## 2019-10-15 ENCOUNTER — Encounter: Payer: Self-pay | Admitting: Family Medicine

## 2019-10-15 VITALS — BP 136/76 | HR 63 | Temp 98.0°F | Ht 77.0 in | Wt 227.0 lb

## 2019-10-15 DIAGNOSIS — N529 Male erectile dysfunction, unspecified: Secondary | ICD-10-CM

## 2019-10-15 DIAGNOSIS — Z125 Encounter for screening for malignant neoplasm of prostate: Secondary | ICD-10-CM

## 2019-10-15 DIAGNOSIS — E1169 Type 2 diabetes mellitus with other specified complication: Secondary | ICD-10-CM

## 2019-10-15 DIAGNOSIS — I10 Essential (primary) hypertension: Secondary | ICD-10-CM

## 2019-10-15 DIAGNOSIS — Z0000 Encounter for general adult medical examination without abnormal findings: Secondary | ICD-10-CM

## 2019-10-15 DIAGNOSIS — E785 Hyperlipidemia, unspecified: Secondary | ICD-10-CM

## 2019-10-15 DIAGNOSIS — E119 Type 2 diabetes mellitus without complications: Secondary | ICD-10-CM

## 2019-10-15 DIAGNOSIS — Z1211 Encounter for screening for malignant neoplasm of colon: Secondary | ICD-10-CM | POA: Diagnosis not present

## 2019-10-15 DIAGNOSIS — E139 Other specified diabetes mellitus without complications: Secondary | ICD-10-CM | POA: Diagnosis not present

## 2019-10-15 LAB — PSA: PSA: 0.4

## 2019-10-15 MED ORDER — RAMIPRIL 10 MG PO CAPS: ORAL_CAPSULE | ORAL | 3 refills | Status: DC

## 2019-10-15 MED ORDER — SILDENAFIL CITRATE 100 MG PO TABS: ORAL_TABLET | ORAL | 5 refills | Status: DC

## 2019-10-15 MED ORDER — HYDROCHLOROTHIAZIDE 25 MG PO TABS: 25.0000 mg | ORAL_TABLET | Freq: Every day | ORAL | 3 refills | Status: DC

## 2019-10-15 MED ORDER — SIMVASTATIN 10 MG PO TABS: ORAL_TABLET | ORAL | 3 refills | Status: DC

## 2019-10-15 MED ORDER — GLIPIZIDE 10 MG PO TABS: 10.0000 mg | ORAL_TABLET | Freq: Two times a day (BID) | ORAL | 3 refills | Status: DC

## 2019-10-15 MED ORDER — METOPROLOL SUCCINATE ER 100 MG PO TB24: ORAL_TABLET | ORAL | 3 refills | Status: DC

## 2019-10-15 MED ORDER — TIZANIDINE HCL 4 MG PO TABS: 4.0000 mg | ORAL_TABLET | Freq: Four times a day (QID) | ORAL | 0 refills | Status: DC | PRN

## 2019-10-15 MED ORDER — METFORMIN HCL 1000 MG PO TABS: ORAL_TABLET | ORAL | 3 refills | Status: DC

## 2019-10-15 NOTE — Addendum Note (Signed)
Addended by: Altamese Cabal on: 10/15/2019 11:05 AM   Modules accepted: Orders

## 2019-10-15 NOTE — Progress Notes (Signed)
Phone [phone]    Subjective:  Patient presents today for their annual wellness visit (subsequent )  Preventive Screening-Counseling & Management  Modifiable Risk Factors/behavioral risk assessment/psychosocial risk assessment Regular exercise: 2-3x a week Diet: reasonably healthy- down 5 lbs  Wt Readings from Last 3 Encounters:  10/15/19 227 lb (103 kg)  06/06/18 232 lb (105.2 kg)  05/16/18 232 lb 4 oz (105.3 kg)   Smoking Status: Never Smoker Second Hand Smoking status: No smokers in home Alcohol intake: 0 per week  Cardiac risk factors:  advanced age (older than 95 for men, 21 for women)  treated Hyperlipidemia  treated Hypertension  controlled diabetes.  Lab Results  Component Value Date   HGBA1C 6.2 (A) 03/13/2018  Family History: sister with heart attack at 41  Reassuring EKG 2017- perhaps update next year.   Depression Screen/risk evaluation Risk factors: none.Marland Kitchen PHQ2 0  Depression screen [facility] 2/9 10/15/2019 01/08/2018 02/21/2017 02/03/2016 04/29/2014  Decreased Interest 0 0 0 0 0  Down, Depressed, Hopeless 0 0 0 0 0  PHQ - 2 Score 0 0 0 0 0  Altered sleeping 0 0 - - -  Tired, decreased energy 0 0 - - -  Change in appetite 0 0 - - -  Feeling bad or failure about yourself  0 0 - - -  Trouble concentrating 0 0 - - -  Moving slowly or fidgety/restless 0 0 - - -  Suicidal thoughts 0 0 - - -  PHQ-9 Score 0 0 - - -  Difficult doing work/chores Not difficult at all Not difficult at all - - -   Functional ability and level of safety Mobility assessment:  timed get up and go <12 seconds Activities of Daily Living- Independent in ADLs (toileting, bathing, dressing, transferring, eating) and in IADLs (shopping, housekeeping, managing own medications, and handling finances) Home Safety: Loose rugs (no), smoke detectors (up to date), small pets (no), grab bars (yes if needed), stairs ( no issues with his stairs- master downstairs), life-alert system (would use cell  phone) Hearing Difficulties: -patient declines Fall Risk: None  Fall Risk  10/15/2019 01/08/2018 02/21/2017 02/03/2016 04/29/2014  Falls in the past year? 0 0 No No No  Number falls in past yr: 0 - - - -  Injury with Fall? 0 - - - -  Opioid use history:  no long term opioids use Self assessment of health status: "fantastic"  Cognitive Testing             No reported trouble.   Mini cog: normal clock draw. 1/3 delayed recall. Normal test result   List the Names of Other Physician/Practitioners you currently use: Patient Care Team: Shelva Majestic, MD as PCP - General (Family Medicine) -[facility]- transitions - Dr. Cleophas Dunker for his knee  Required Immunizations needed today:  Flu shot in the fall Immunization History  Administered Date(s) Administered  . Fluad Quad(high Dose 65+) 10/31/2018  . Influenza Split 03/05/2011  . Influenza Whole 03/05/2005  . Influenza, High Dose Seasonal PF 02/21/2017, 01/08/2018  . Influenza,inj,Quad PF,6+ Mos 04/29/2014  . Pneumococcal Conjugate-13 09/01/2014  . Pneumococcal Polysaccharide-23 03/05/2006, 02/02/2016  . Td 03/06/1999  . Tdap 03/05/2011  . Zoster 09/03/2014   Health Maintenance  Topic Date Due  . Eye exam for diabetics  Never done  . COVID-19 Vaccine (1) Never done  . Colon Cancer Screening  Never done  . Hemoglobin A1C  09/11/2018  . Flu Shot  10/04/2019  . Complete foot  exam   10/14/2020  . Tetanus Vaccine  03/04/2021  .  Hepatitis C: One time screening is recommended by Center for Disease Control  (CDC) for  adults born from [birth year] through 1967.   Completed  . Pneumonia vaccines  Completed    Screening tests-  Health Maintenance Due  Topic Date Due  . OPHTHALMOLOGY EXAM -have them send [place] a copy after exam next week Never done  . COVID-19 Vaccine (1)-will get [place] the information on dates Never done  . COLONOSCOPY -needs to schedule Never done    1. Colon cancer screening- refer today 2. Lung Cancer  screening- not candidate 3. Skin cancer screening- low risk 4. Prostate cancer screening- trend with PSA Lab Results  Component Value Date   PSA 0.56 04/03/2018   PSA 0.58 03/14/2017   PSA 0.50 01/25/2016    The following were reviewed and entered/updated in epic if appropriate: Past Medical History:  Diagnosis Date  . Diabetes type 2, controlled (HCC)   . Hyperlipidemia   . Hypertension   . UNSPECIFIED TACHYCARDIA 05/06/2007   Listed 05/06/2007- do not see recurrent tachycardia since that time    Patient Active Problem List   Diagnosis Date Noted  . Diabetes mellitus type II, controlled (HCC) 11/25/2006    Priority: High  . Hyperlipidemia associated with type 2 diabetes mellitus (HCC) 02/21/2017    Priority: Medium  . Essential hypertension 11/25/2006    Priority: Medium  . Erectile dysfunction 09/01/2014    Priority: Low  . Anemia, chronic disease 07/14/2009   Past Surgical History:  Procedure Laterality Date  . MULTIPLE TOOTH EXTRACTIONS      Family History  Problem Relation Age of Onset  . Diabetes Other   . Kidney disease Other   . Other Mother        respiratory failure died in 88s  . Alcohol abuse Father   . Lung disease Father        smoking  . Heart disease Sister        heart attack 12  . Alcohol abuse Brother   . Cirrhosis Brother   . Kidney disease Sister   . COPD Sister        led to death. procedural complication  . Diabetes Brother     Medications- reviewed and updated Current Outpatient Medications  Medication Sig Dispense Refill  . glipiZIDE (GLUCOTROL) 10 MG tablet Take 1 tablet (10 mg total) by mouth 2 (two) times daily before a meal. 180 tablet 3  . hydrochlorothiazide (HYDRODIURIL) 25 MG tablet Take 1 tablet (25 mg total) by mouth daily. 90 tablet 3  . metFORMIN (GLUCOPHAGE) 1000 MG tablet TAKE 1 TABLET BY MOUTH TWICE DAILY WITH A MEAL 180 tablet 3  . metoprolol succinate (TOPROL-XL) 100 MG 24 hr tablet Take with or immediately following a  meal. 90 tablet 3  . ramipril (ALTACE) 10 MG capsule TAKE 1 CAPSULE (10 MG TOTAL) BY MOUTH DAILY 90 capsule 3  . sildenafil (VIAGRA) 100 MG tablet Take 1 tablet as needed for erectile dysfunction up to every 72 hours 10 tablet 5  . simvastatin (ZOCOR) 10 MG tablet Daily 90 tablet 3  . tiZANidine (ZANAFLEX) 4 MG tablet Take 1 tablet (4 mg total) by mouth every 6 (six) hours as needed for muscle spasms (do not drive for 6 hours after taking). 30 tablet 0   No current facility-administered medications for this visit.    Allergies-reviewed and updated No Known Allergies  Social History  Socioeconomic History  . Marital status: Married    Spouse name: Not on file  . Number of children: Not on file  . Years of education: Not on file  . Highest education level: Not on file  Occupational History  . Not on file  Tobacco Use  . Smoking status: Never Smoker  . Smokeless tobacco: Never Used  Vaping Use  . Vaping Use: Never used  Substance and Sexual Activity  . Alcohol use: No  . Drug use: No  . Sexual activity: Yes  Other Topics Concern  . Not on file  Social History Narrative   Married 3 children. Wife and son patient here. 2 grandkids.       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    [employer] undergrad   1978 [employer]- 4 degrees through 2000- [employer], math. 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    Social Determinants of Health   Financial Resource Strain:   . Difficulty of Paying Living Expenses:   Food  Insecurity:   . Worried About Programme researcher, broadcasting/film/video in the Last Year:   . Barista in the Last Year:   Transportation Needs:   . Freight forwarder (Medical):   Marland Kitchen Lack of Transportation (Non-Medical):   Physical Activity:   . Days of Exercise per Week:   . Minutes of Exercise per Session:   Stress:   . Feeling of Stress :   Social Connections:   . Frequency of Communication with Friends and Family:   . Frequency of Social Gatherings with Friends and Family:   . Attends Religious Services:   . Active Member of Clubs or Organizations:   . Attends Banker Meetings:   Marland Kitchen Marital Status:       Objective:  BP (!) 142/70   Pulse 63   Temp 98 F (36.7 C)   Ht 6\' 5"  (1.956 m)   Wt 227 lb (103 kg)   SpO2 98%   BMI 26.92 kg/m  Gen: NAD, resting comfortably   Assessment/Plan:  AWV completed 1. Educated, counseled and referred based on above elements 2. Educated, counseled and referred as appropriate for preventative needs 3. Discussed and documented a written plan for preventiative services and screenings with personalized health advice- After Visit Summary was given to patient which included this plan   Status of chronic or acute concerns  See physical  Recommended follow up:1 year wellness Lab/Order associations:   ICD-10-CM  1. Preventative health care  Z00.00                     Return precautions advised.  , MD

## 2019-10-16 ENCOUNTER — Other Ambulatory Visit: Payer: Self-pay

## 2019-10-16 ENCOUNTER — Ambulatory Visit: Payer: Medicare PPO

## 2019-10-16 ENCOUNTER — Ambulatory Visit (INDEPENDENT_AMBULATORY_CARE_PROVIDER_SITE_OTHER): Payer: Medicare PPO | Admitting: Family Medicine

## 2019-10-16 ENCOUNTER — Other Ambulatory Visit: Payer: Medicare PPO

## 2019-10-16 ENCOUNTER — Telehealth: Payer: Self-pay | Admitting: Family Medicine

## 2019-10-16 DIAGNOSIS — E875 Hyperkalemia: Secondary | ICD-10-CM

## 2019-10-16 LAB — COMPREHENSIVE METABOLIC PANEL
AG Ratio: 1.7 (calc) (ref 1.0–2.5)
ALT: 13 U/L (ref 9–46)
AST: 15 U/L (ref 10–35)
Albumin: 4.1 g/dL (ref 3.6–5.1)
Alkaline phosphatase (APISO): 54 U/L (ref 35–144)
BUN/Creatinine Ratio: 14 (calc) (ref 6–22)
BUN: 23 mg/dL (ref 7–25)
CO2: 27 mmol/L (ref 20–32)
Calcium: 9.6 mg/dL (ref 8.6–10.3)
Chloride: 108 mmol/L (ref 98–110)
Creat: 1.62 mg/dL — ABNORMAL HIGH (ref 0.70–1.18)
Globulin: 2.4 g/dL (calc) (ref 1.9–3.7)
Glucose, Bld: 43 mg/dL — ABNORMAL LOW (ref 65–99)
Potassium: 6.3 mmol/L (ref 3.5–5.3)
Sodium: 139 mmol/L (ref 135–146)
Total Bilirubin: 0.4 mg/dL (ref 0.2–1.2)
Total Protein: 6.5 g/dL (ref 6.1–8.1)

## 2019-10-16 LAB — LIPID PANEL
Cholesterol: 112 mg/dL (ref <200)
HDL: 38 mg/dL — ABNORMAL LOW (ref >=40)
LDL Cholesterol (Calc): 56 mg/dL (calc)
Non-HDL Cholesterol (Calc): 74 mg/dL (calc) (ref <130)
Total CHOL/HDL Ratio: 2.9 (calc) (ref <5.0)
Triglycerides: 93 mg/dL (ref <150)

## 2019-10-16 LAB — BASIC METABOLIC PANEL
BUN/Creatinine Ratio: 14 (calc) (ref 6–22)
BUN: 23 mg/dL (ref 7–25)
CO2: 24 mmol/L (ref 20–32)
Calcium: 9.4 mg/dL (ref 8.6–10.3)
Chloride: 109 mmol/L (ref 98–110)
Creat: 1.63 mg/dL — ABNORMAL HIGH (ref 0.70–1.18)
Glucose, Bld: 84 mg/dL (ref 65–99)
Potassium: 6 mmol/L — ABNORMAL HIGH (ref 3.5–5.3)
Sodium: 141 mmol/L (ref 135–146)

## 2019-10-16 LAB — CBC WITH DIFFERENTIAL/PLATELET
Absolute Monocytes: 484 cells/uL (ref 200–950)
Basophils Absolute: 40 cells/uL (ref 0–200)
Basophils Relative: 0.9 %
Eosinophils Absolute: 220 cells/uL (ref 15–500)
Eosinophils Relative: 5 %
HCT: 35.1 % — ABNORMAL LOW (ref 38.5–50.0)
Hemoglobin: 11.5 g/dL — ABNORMAL LOW (ref 13.2–17.1)
Lymphs Abs: 1465 cells/uL (ref 850–3900)
MCH: 32 pg (ref 27.0–33.0)
MCHC: 32.8 g/dL (ref 32.0–36.0)
MCV: 97.8 fL (ref 80.0–100.0)
MPV: 10 fL (ref 7.5–12.5)
Monocytes Relative: 11 %
Neutro Abs: 2191 cells/uL (ref 1500–7800)
Neutrophils Relative %: 49.8 %
Platelets: 262 10*3/uL (ref 140–400)
RBC: 3.59 10*6/uL — ABNORMAL LOW (ref 4.20–5.80)
RDW: 12.3 % (ref 11.0–15.0)
Total Lymphocyte: 33.3 %
WBC: 4.4 10*3/uL (ref 3.8–10.8)

## 2019-10-16 LAB — HEMOGLOBIN A1C
Hgb A1c MFr Bld: 6.1 % of total Hgb — ABNORMAL HIGH (ref <5.7)
Mean Plasma Glucose: 128 (calc)
eAG (mmol/L): 7.1 (calc)

## 2019-10-16 LAB — PSA: PSA: 0.4 ng/mL (ref <=4.0)

## 2019-10-16 NOTE — Progress Notes (Signed)
Patient was called in to have repeat labs and EKG. Patient denies any chest pain, Sob, muscle weakness. He did bring in his covid vaccination information and updated that in his chart. EKG given to Dr. Durene Cal for review. Patient informed of his directions all questions answered   EKG: sinus rhythm with rate 61, normal axis, normal intervals, no hypertrophy, no st or t wave changes- specifically no peaked t waves - read by Tana Conch, MD

## 2019-10-16 NOTE — Telephone Encounter (Signed)
Spoke to patient will come in today for labs and nurse visit for EKG

## 2019-10-16 NOTE — Telephone Encounter (Signed)
Lab Name [facility] Phone Number [phone] Lab Tech Name Ronney Asters Lab Reference Number [id number] P Chief Complaint Lab Result (Critical or Stat) Call Type Lab Send to RN Reason for Call Report lab results Initial Comment Caller states she has a critical lab result to report. Translation No Disp. Time Lamount Cohen Time) Disposition Final User 10/16/2019 2:46:54 AM Lab Call Micheline Maze, RN, Clydie Braun Reason: Critical Lab K+ 6.3 - specimen collected 8/12 at 1106am

## 2019-10-16 NOTE — Telephone Encounter (Signed)
Have you seen this ?

## 2019-10-16 NOTE — Telephone Encounter (Signed)
Please recheck BMP stat under hyperkalemia- encourage him to come this afternoon- want him to make sure to be very well hydrated. I may have to stop his ramipril if this is persistent on lab.   If he develops any of the following should seek care immediately- worsening muscle weakness, chest pain, shortness of breath, or palpitations.

## 2019-10-16 NOTE — Telephone Encounter (Signed)
Pt called back, then hung up on home.

## 2019-10-16 NOTE — Patient Instructions (Signed)
Health Maintenance Due  Topic Date Due  . OPHTHALMOLOGY EXAM  Never done  . COLONOSCOPY  Never done  . INFLUENZA VACCINE  10/04/2019    Depression screen [facility] 2/9 10/15/2019 01/08/2018 02/21/2017  Decreased Interest 0 0 0  Down, Depressed, Hopeless 0 0 0  PHQ - 2 Score 0 0 0  Altered sleeping 0 0 -  Tired, decreased energy 0 0 -  Change in appetite 0 0 -  Feeling bad or failure about yourself  0 0 -  Trouble concentrating 0 0 -  Moving slowly or fidgety/restless 0 0 -  Suicidal thoughts 0 0 -  PHQ-9 Score 0 0 -  Difficult doing work/chores Not difficult at all Not difficult at all -    Recommended follow up: No follow-ups on file.

## 2019-10-16 NOTE — Telephone Encounter (Signed)
Spoke with the patient and advised him that Dr. Durene Cal wanted to recheck his BMP. I advised him that he may need to stop his ramipril. He is also aware that if he starts having worsening muscle weakness, chest pain, shortness of breath, or palpitations to seek care immediately. Patient still wants to speak with Joellen.

## 2019-10-16 NOTE — Telephone Encounter (Signed)
Left message to return call to our office.  

## 2019-10-19 ENCOUNTER — Other Ambulatory Visit: Payer: Self-pay

## 2019-10-19 DIAGNOSIS — E119 Type 2 diabetes mellitus without complications: Secondary | ICD-10-CM

## 2019-10-19 DIAGNOSIS — E875 Hyperkalemia: Secondary | ICD-10-CM

## 2019-10-27 ENCOUNTER — Encounter: Payer: Self-pay | Admitting: Family Medicine

## 2019-10-27 ENCOUNTER — Other Ambulatory Visit: Payer: Self-pay

## 2019-10-27 ENCOUNTER — Other Ambulatory Visit: Payer: Medicare PPO

## 2019-10-27 DIAGNOSIS — E119 Type 2 diabetes mellitus without complications: Secondary | ICD-10-CM | POA: Diagnosis not present

## 2019-10-28 LAB — BASIC METABOLIC PANEL
BUN/Creatinine Ratio: 16 (calc) (ref 6–22)
BUN: 26 mg/dL — ABNORMAL HIGH (ref 7–25)
CO2: 23 mmol/L (ref 20–32)
Calcium: 8.7 mg/dL (ref 8.6–10.3)
Chloride: 106 mmol/L (ref 98–110)
Creat: 1.63 mg/dL — ABNORMAL HIGH (ref 0.70–1.18)
Glucose, Bld: 200 mg/dL — ABNORMAL HIGH (ref 65–99)
Potassium: 5 mmol/L (ref 3.5–5.3)
Sodium: 138 mmol/L (ref 135–146)

## 2019-10-28 LAB — MICROALBUMIN / CREATININE URINE RATIO
Creatinine, Urine: 116 mg/dL (ref 20–320)
Microalb Creat Ratio: 2 mcg/mg creat (ref <30)
Microalb, Ur: 0.2 mg/dL

## 2019-11-02 ENCOUNTER — Other Ambulatory Visit: Payer: Self-pay

## 2019-11-05 ENCOUNTER — Encounter: Payer: Self-pay | Admitting: Family Medicine

## 2019-11-05 ENCOUNTER — Other Ambulatory Visit: Payer: Self-pay

## 2019-11-05 ENCOUNTER — Ambulatory Visit (INDEPENDENT_AMBULATORY_CARE_PROVIDER_SITE_OTHER): Payer: Medicare PPO | Admitting: Family Medicine

## 2019-11-05 VITALS — BP 138/80 | HR 69 | Temp 97.2°F | Ht 77.0 in | Wt 222.0 lb

## 2019-11-05 DIAGNOSIS — I10 Essential (primary) hypertension: Secondary | ICD-10-CM

## 2019-11-05 DIAGNOSIS — R319 Hematuria, unspecified: Secondary | ICD-10-CM

## 2019-11-05 DIAGNOSIS — Z23 Encounter for immunization: Secondary | ICD-10-CM | POA: Diagnosis not present

## 2019-11-05 DIAGNOSIS — Z1211 Encounter for screening for malignant neoplasm of colon: Secondary | ICD-10-CM | POA: Diagnosis not present

## 2019-11-05 DIAGNOSIS — M545 Low back pain, unspecified: Secondary | ICD-10-CM

## 2019-11-05 NOTE — Progress Notes (Signed)
Phone [phone] In person visit   Subjective:   Mark Nickson Sr. is a 69 y.o. year old very pleasant male patient who presents for/with See problem oriented charting Chief Complaint  Patient presents with  . Hematuria   This visit occurred during the SARS-CoV-2 public health emergency.  Safety protocols were in place, including screening questions prior to the visit, additional usage of staff PPE, and extensive cleaning of exam room while observing appropriate contact time as indicated for disinfecting solutions.   Past Medical History-  Patient Active Problem List   Diagnosis Date Noted  . Diabetes mellitus type II, controlled (HCC) 11/25/2006    Priority: High  . Hyperlipidemia associated with type 2 diabetes mellitus (HCC) 02/21/2017    Priority: Medium  . Essential hypertension 11/25/2006    Priority: Medium  . Erectile dysfunction 09/01/2014    Priority: Low  . Anemia, chronic disease 07/14/2009    Medications- reviewed and updated Current Outpatient Medications  Medication Sig Dispense Refill  . glipiZIDE (GLUCOTROL) 10 MG tablet Take 1 tablet (10 mg total) by mouth 2 (two) times daily before a meal. 180 tablet 3  . hydrochlorothiazide (HYDRODIURIL) 25 MG tablet Take 1 tablet (25 mg total) by mouth daily. 90 tablet 3  . metFORMIN (GLUCOPHAGE) 1000 MG tablet TAKE 1 TABLET BY MOUTH TWICE DAILY WITH A MEAL 180 tablet 3  . metoprolol succinate (TOPROL-XL) 100 MG 24 hr tablet Take with or immediately following a meal. 90 tablet 3  . sildenafil (VIAGRA) 100 MG tablet Take 1 tablet as needed for erectile dysfunction up to every 72 hours 10 tablet 5  . simvastatin (ZOCOR) 10 MG tablet Daily 90 tablet 3  . tiZANidine (ZANAFLEX) 4 MG tablet Take 1 tablet (4 mg total) by mouth every 6 (six) hours as needed for muscle spasms (do not drive for 6 hours after taking). 30 tablet 0   No current facility-administered medications for this visit.     Objective:  BP 138/80    Pulse 69   Temp (!) 97.2 F (36.2 C) (Temporal)   Ht 6\' 5"  (1.956 m)   Wt 222 lb (100.7 kg)   SpO2 98%   BMI 26.33 kg/m  Gen: NAD, resting comfortably CV: RRR no murmurs rubs or gallops Lungs: CTAB no crackles, wheeze, rhonchi Abdomen: soft/nontender/nondistended/normal bowel sounds. No rebound or guarding.  Ext: no edema Skin: warm, dry Back - Normal skin, Spine with normal alignment and no deformity.  No tenderness to vertebral process palpation.  Paraspinous muscles are tender in right low back-no spasm.  No CVA tenderness .  Neuro-normal gait and apparent strength in lower extremities  Test to bring back urine sample  No results found for this or any previous visit (from the past 24 hour(s)).     Assessment and Plan   # Blood in urine/right low back pain radiating to groin S:seen only once yesterday- pinkish discoloration to urine but not today. Denies any urgency, frequency or discomfort with urination.    Has had some tenderness in right low back as well for several weeks at times radiating to right genital area at times- into the testicle slightly at tiemes Tylenol helps with pain. Reports some heavy lifting in the back of his truck and did it himself. Had been seen in urgent care 09/26/19- given muscle relaxant and diclofenac  A/P: Patient with gross hematuria but also right low bak pain radiating to groin- makes me concerned for kidney stone. Hold off on urology referral  unless no stone is found- he is never smoker so lower risk for urological malignancy  I still think his right low back pain could be related to musculoskeletal cause with radiation to the groin.  Had similar back pain in January.More concern for kidney stone as above-we will start with CT renal stone study  We will also get urinalysis and urine culture-you may have to bring this back  #hypertension S: medication: Hydrochlorothiazide 25 mg, metoprolol 100 mg 24-hour, ramipril 10 mg Off ramipril due to  hyperkalemia August 2021 BP Readings from Last 3 Encounters:  11/05/19 138/80  10/15/19 136/76  09/26/19 (!) 151/77   A/P: well controlled- continue current meds  Recommended follow up: Keep December visit or sooner if needed pending above work-up Future Appointments  Date Time Provider Department Center  02/12/2020  1:40 PM Shelva Majestic, MD [facility]    Lab/Order associations:   ICD-10-CM   1. Hematuria, unspecified type  R31.9 POCT Urinalysis Dipstick (Automated)    CT RENAL STONE STUDY    Urine Culture    CANCELED: CT RENAL STONE STUDY  2. Essential hypertension  I10   3. Right-sided low back pain without sciatica, unspecified chronicity  M54.5 CT RENAL STONE STUDY    CANCELED: CT RENAL STONE STUDY  4. Screening for colon cancer  Z12.11     Time Spent: 30 minutes of total time (10:27 AM- 10:57 AM) was spent on the date of the encounter performing the following actions: chart review prior to seeing the patient, obtaining history, performing a medically necessary exam, counseling on the treatment plan, placing orders, and documenting in our EHR.   Return precautions advised.  Tana Conch, MD

## 2019-11-05 NOTE — Patient Instructions (Addendum)
Health Maintenance Due  Topic Date Due   OPHTHALMOLOGY EXAM - team please request records- sign ROI Never done   COLONOSCOPY - please call GI back Never done   INFLUENZA VACCINE - high dose flu shot today 10/04/2019   We will call you within a week about your referral for CT scan. If you do not hear within a week, give [place] a call.   Glad blood pressure looks so good!   Recommended follow up: Let us know if you have new or worsening symptoms particularly fever or uncontrolled pain

## 2019-11-20 ENCOUNTER — Other Ambulatory Visit: Payer: Self-pay

## 2019-11-20 ENCOUNTER — Ambulatory Visit (INDEPENDENT_AMBULATORY_CARE_PROVIDER_SITE_OTHER)
Admission: RE | Admit: 2019-11-20 | Discharge: 2019-11-20 | Disposition: A | Discharge status: Home / Self Care | Payer: Medicare PPO | Source: Ambulatory Visit | Attending: Family Medicine | Admitting: Family Medicine

## 2019-11-20 DIAGNOSIS — M4316 Spondylolisthesis, lumbar region: Secondary | ICD-10-CM | POA: Diagnosis not present

## 2019-11-20 DIAGNOSIS — K429 Umbilical hernia without obstruction or gangrene: Secondary | ICD-10-CM | POA: Diagnosis not present

## 2019-11-20 DIAGNOSIS — R319 Hematuria, unspecified: Secondary | ICD-10-CM | POA: Diagnosis not present

## 2019-11-20 DIAGNOSIS — I700 Atherosclerosis of aorta: Secondary | ICD-10-CM | POA: Diagnosis not present

## 2019-11-20 DIAGNOSIS — M545 Low back pain, unspecified: Secondary | ICD-10-CM

## 2019-11-22 ENCOUNTER — Encounter: Payer: Self-pay | Admitting: Family Medicine

## 2019-11-26 ENCOUNTER — Other Ambulatory Visit: Payer: Self-pay

## 2019-11-26 DIAGNOSIS — R310 Gross hematuria: Secondary | ICD-10-CM

## 2019-12-11 ENCOUNTER — Ambulatory Visit (INDEPENDENT_AMBULATORY_CARE_PROVIDER_SITE_OTHER): Payer: Medicare PPO | Admitting: Family Medicine

## 2019-12-11 ENCOUNTER — Encounter: Payer: Self-pay | Admitting: Family Medicine

## 2019-12-11 ENCOUNTER — Other Ambulatory Visit: Payer: Self-pay

## 2019-12-11 VITALS — BP 124/80 | HR 61 | Temp 97.3°F | Resp 18 | Ht 77.0 in | Wt 222.7 lb

## 2019-12-11 DIAGNOSIS — R319 Hematuria, unspecified: Secondary | ICD-10-CM | POA: Diagnosis not present

## 2019-12-11 DIAGNOSIS — I10 Essential (primary) hypertension: Secondary | ICD-10-CM | POA: Diagnosis not present

## 2019-12-11 DIAGNOSIS — M545 Low back pain, unspecified: Secondary | ICD-10-CM

## 2019-12-11 DIAGNOSIS — G8929 Other chronic pain: Secondary | ICD-10-CM | POA: Diagnosis not present

## 2019-12-11 DIAGNOSIS — I700 Atherosclerosis of aorta: Secondary | ICD-10-CM | POA: Insufficient documentation

## 2019-12-11 MED ORDER — TIZANIDINE HCL 4 MG PO TABS: 4.0000 mg | ORAL_TABLET | Freq: Four times a day (QID) | ORAL | 1 refills | Status: DC | PRN

## 2019-12-11 NOTE — Patient Instructions (Addendum)
Health Maintenance Due  Topic Date Due  . OPHTHALMOLOGY EXAM Scheduled for Nov 4 with the [place]. Have them send [place] a copy afterwards Never done  . COLONOSCOPY Has not been scheduled yet- please call them to get this scheduled Never done   Please keep urology visit. Let us know if you have recurrent blood in the urine.   We will call you within two weeks about your referral to Dr. Katrinka Blazing of sports medicine. If you do not hear within 3 weeks, give [place] a call.   Recommended follow up: Return for as needed for new, worsening, persistent symptoms.

## 2019-12-11 NOTE — Progress Notes (Signed)
Phone [phone] In person visit   Subjective:   Mark Ghee Sr. is a 69 y.o. year old very pleasant male patient who presents for/with See problem oriented charting Chief Complaint  Patient presents with  . Discuss CT Scan   This visit occurred during the SARS-CoV-2 public health emergency.  Safety protocols were in place, including screening questions prior to the visit, additional usage of staff PPE, and extensive cleaning of exam room while observing appropriate contact time as indicated for disinfecting solutions.   Past Medical History-  Patient Active Problem List   Diagnosis Date Noted  . Diabetes mellitus type II, controlled (HCC) 11/25/2006    Priority: High  . Hyperlipidemia associated with type 2 diabetes mellitus (HCC) 02/21/2017    Priority: Medium  . Essential hypertension 11/25/2006    Priority: Medium  . Erectile dysfunction 09/01/2014    Priority: Low  . Anemia, chronic disease 07/14/2009    Medications- reviewed and updated Current Outpatient Medications  Medication Sig Dispense Refill  . glipiZIDE (GLUCOTROL) 10 MG tablet Take 1 tablet (10 mg total) by mouth 2 (two) times daily before a meal. 180 tablet 3  . hydrochlorothiazide (HYDRODIURIL) 25 MG tablet Take 1 tablet (25 mg total) by mouth daily. 90 tablet 3  . metFORMIN (GLUCOPHAGE) 1000 MG tablet TAKE 1 TABLET BY MOUTH TWICE DAILY WITH A MEAL 180 tablet 3  . metoprolol succinate (TOPROL-XL) 100 MG 24 hr tablet Take with or immediately following a meal. 90 tablet 3  . sildenafil (VIAGRA) 100 MG tablet Take 1 tablet as needed for erectile dysfunction up to every 72 hours 10 tablet 5  . simvastatin (ZOCOR) 10 MG tablet Daily 90 tablet 3  . tiZANidine (ZANAFLEX) 4 MG tablet Take 1 tablet (4 mg total) by mouth every 6 (six) hours as needed for muscle spasms (do not drive for 6 hours after taking). 30 tablet 1   No current facility-administered medications for this visit.     Objective:  BP  124/80   Pulse 61   Temp (!) 97.3 F (36.3 C) (Temporal)   Resp 18   Ht 6\' 5"  (1.956 m)   Wt 222 lb 11.2 oz (101 kg)   SpO2 97%   BMI 26.41 kg/m  Gen: NAD, resting comfortably CV: RRR no murmurs rubs or gallops Lungs: CTAB no crackles, wheeze, rhonchi Skin: warm, dry No midline pain along the back-does have some muscle spasm in the right paraspinous muscles    Assessment and Plan   # hematuria suspected- prior pink urine S:Patient noted pinkish discoloration to his urine early September.  He was having intermittent back pain issues and we were concerned about possible kidney stones-renal stone protocol without stones.  We placed a referral to urology.  No blood in urine since that time. Was taking cranberry tablets and inside color was similar to what he saw in urine.   Lower risk as never smoker. Had some family members that smoked but he wasn't around it.   A/P:  Today we reviewed the prior CT scan in fact there were no stones.  As change in urination is concerning for blood in the urine-I would like for him to sit down with urology-he agrees to do so after our discussion.  We discussed possible cystoscopy and repeat CT hematuria protocol.  I also discussed obviously would defer to urology expertise-without recurrence and with more of a pinkish discoloration and without blood on follow-up urine test in office-they may opt to not proceed  with further work-up  #Right sided back Pain S:Patient mentioned that he has some back pain that comes and goes to his right lower side-looking back in the chart this goes back to at least July. He stated that he believes he may have pulled/strained a muscle when he lifted two heavy mowers to place in his SUV- out of storage so had to lift them both twice.  Occasionally gets some radiation into the right groin as well A/P: We reviewed patient's CT renal stone study-multiple arthritic changes noted.  I suspect patient's pain is coming from this  arthritis.  I would like to get sports medicine's opinion-referral to Dr. Katrinka Blazing placed today as patient is interested in possible osteopathic manipulation if thought appropriate.  I also refilled tizanidine as patient seems rather tight in low back.  #hypertension S: medication: hctz 25 mg, metoprolol 100mg  XR, ramipril 10 mg - off ramipril due to hyperkalemia aug 2021 BP Readings from Last 3 Encounters:  12/11/19 124/80  11/05/19 138/80  10/15/19 136/76  A/P: Excellent control-continue current medication  Recommended follow up: Keep December follow-up or sooner if needed Future Appointments  Date Time Provider Department Center  02/12/2020  1:40 PM 14/12/2019, MD [facility]    Lab/Order associations:   ICD-10-CM   1. Chronic right-sided low back pain, unspecified whether sciatica present  M54.50 Ambulatory referral to Sports Medicine   G89.29     Meds ordered this encounter  Medications  . tiZANidine (ZANAFLEX) 4 MG tablet    Sig: Take 1 tablet (4 mg total) by mouth every 6 (six) hours as needed for muscle spasms (do not drive for 6 hours after taking).    Dispense:  30 tablet    Refill:  1    Time Spent: 31 minutes of total time (3:49 PM-4:15 PM, 10:03-10:08 PM) was spent on the date of the encounter performing the following actions: chart review prior to seeing the patient, obtaining history, performing a medically necessary exam, counseling on the treatment plan, placing orders, and documenting in our EHR.   Return precautions advised.  08-13-1985, MD

## 2020-01-01 ENCOUNTER — Ambulatory Visit: Payer: Medicare PPO | Admitting: Family Medicine

## 2020-01-01 ENCOUNTER — Other Ambulatory Visit: Payer: Self-pay

## 2020-01-01 ENCOUNTER — Encounter: Payer: Self-pay | Admitting: Family Medicine

## 2020-01-01 ENCOUNTER — Ambulatory Visit (INDEPENDENT_AMBULATORY_CARE_PROVIDER_SITE_OTHER): Payer: Medicare PPO

## 2020-01-01 VITALS — BP 132/90 | HR 65 | Ht 77.0 in | Wt 226.0 lb

## 2020-01-01 DIAGNOSIS — M5441 Lumbago with sciatica, right side: Secondary | ICD-10-CM | POA: Diagnosis not present

## 2020-01-01 DIAGNOSIS — M25561 Pain in right knee: Secondary | ICD-10-CM | POA: Insufficient documentation

## 2020-01-01 DIAGNOSIS — G8929 Other chronic pain: Secondary | ICD-10-CM | POA: Diagnosis not present

## 2020-01-01 DIAGNOSIS — M545 Low back pain, unspecified: Secondary | ICD-10-CM

## 2020-01-01 DIAGNOSIS — M1711 Unilateral primary osteoarthritis, right knee: Secondary | ICD-10-CM | POA: Diagnosis not present

## 2020-01-01 DIAGNOSIS — M170 Bilateral primary osteoarthritis of knee: Secondary | ICD-10-CM | POA: Diagnosis not present

## 2020-01-01 DIAGNOSIS — M1712 Unilateral primary osteoarthritis, left knee: Secondary | ICD-10-CM | POA: Diagnosis not present

## 2020-01-01 DIAGNOSIS — M25562 Pain in left knee: Secondary | ICD-10-CM

## 2020-01-01 NOTE — Progress Notes (Signed)
[facility] [address] [place] [address] Phone: [phone] Subjective:   [name], am serving as a scribe for Dr. Antoine Primas. This visit occurred during the SARS-CoV-2 public health emergency.  Safety protocols were in place, including screening questions prior to the visit, additional usage of staff PPE, and extensive cleaning of exam room while observing appropriate contact time as indicated for disinfecting solutions.   I'm seeing this patient by the request  of:  Shelva Majestic, MD  CC: Low back pain   [id number]  Mark Boschert Sr. is a 69 y.o. male coming in with complaint of low back pain that wraps around to the right groin. 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 Using Zanaflex and received injection in backside. Pain worse in morning. Denies any radiating symptoms.  Patient states that it is fairly severe though.  Can catch his breath from time to time.  Patient was having some associated hematuria initially and was sent for a CT renal stone study.  This was independently visualized by me.  No sign of any kidney stone and he is having further work-up with urology.  Patient does have mild spondylosis mild anterior listhesis of L4 on L5.  Patient also notes that he has bilateral knee pain.  Patient states the right knee he has had some difficulty before and has had a meniscal tear  Reviewed patient's chart and did see Dr. Cleophas Dunker for this.  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  Patient is also having left knee pain.  Has known shrapnel in the thigh.  Patient states that they can be sore at the end of the day or going up or down stairs.       Past Medical History:  Diagnosis Date  . Diabetes type 2, controlled (HCC)   . Hyperlipidemia   . Hypertension   . UNSPECIFIED TACHYCARDIA 05/06/2007    Listed 05/06/2007- do not see recurrent tachycardia since that time    Past Surgical History:  Procedure Laterality Date  . MULTIPLE TOOTH EXTRACTIONS     Social History   Socioeconomic History  . Marital status: Married    Spouse name: Not on file  . Number of children: Not on file  . Years of education: Not on file  . Highest education level: Not on file  Occupational History  . Not on file  Tobacco Use  . Smoking status: Never Smoker  . Smokeless tobacco: Never Used  Vaping Use  . Vaping Use: Never used  Substance and Sexual Activity  . Alcohol use: No  . Drug use: No  . Sexual activity: Yes  Other Topics Concern  . Not on file  Social History Narrative   Married 3 children. Wife and son patient here. 2 grandkids.       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    [employer] undergrad   1978 [employer]- 4 degrees through 2000- [employer], math. 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    Social Determinants of Health   Financial Resource Strain:   . Difficulty of Paying Living Expenses: Not on file  Food Insecurity:   . Worried About Programme researcher, broadcasting/film/video in the Last Year: Not on file  . Ran Out of Food in the Last Year: Not on file  Transportation Needs:   . Lack of Transportation (Medical): Not on file  . Lack of Transportation (Non-Medical): Not on file  Physical Activity:   . Days of Exercise per Week: Not on file  . Minutes of Exercise per Session: Not on file  Stress:   . Feeling of Stress : Not on  file  Social Connections:   . Frequency of Communication with Friends and Family: Not on file  . Frequency of Social Gatherings with Friends and Family: Not on file  . Attends Religious Services: Not on file  . Active Member of Clubs or Organizations: Not on file  . Attends Banker Meetings: Not on file  . Marital Status: Not on file   No Known Allergies Family History  Problem Relation Age of Onset  . Diabetes Other   . Kidney disease Other   . Other Mother        respiratory failure died in 16s  . Alcohol abuse Father   . Lung disease Father        smoking  . Heart disease Sister        heart attack 23  . Alcohol abuse Brother   . Cirrhosis Brother   . Kidney disease Sister   . COPD Sister        led to death. procedural complication  . Diabetes Brother     Current Outpatient Medications (Endocrine & Metabolic):  .  glipiZIDE (GLUCOTROL) 10 MG tablet, Take 1 tablet (10 mg total) by mouth 2 (two) times daily before a meal. .  metFORMIN (GLUCOPHAGE) 1000 MG tablet, TAKE 1 TABLET BY MOUTH TWICE DAILY WITH A MEAL  Current Outpatient Medications (Cardiovascular):  .  hydrochlorothiazide (HYDRODIURIL) 25 MG tablet, Take 1 tablet (25 mg total) by mouth daily. .  metoprolol succinate (TOPROL-XL) 100 MG 24 hr tablet, Take with or immediately following a meal. .  sildenafil (VIAGRA) 100 MG tablet, Take 1 tablet as needed for erectile dysfunction up to every 72 hours .  simvastatin (ZOCOR) 10 MG tablet, Daily     Current Outpatient Medications (Other):  .  tiZANidine (ZANAFLEX) 4 MG tablet, Take 1 tablet (4 mg total) by mouth every 6 (six) hours as needed for muscle spasms (do not drive for 6 hours after taking).   Reviewed prior external information including notes and imaging from  primary care provider As well as notes that were available from care everywhere and other healthcare systems.  Past medical history, social, surgical and family history all reviewed  in electronic medical record.  No pertanent information unless stated regarding to the chief complaint.   Review of Systems:  No headache, visual changes, nausea, vomiting, diarrhea, constipation, dizziness, abdominal pain, skin rash, fevers, chills, night sweats, weight loss, swollen lymph nodes, body aches, joint swelling, chest pain, shortness of breath, mood changes. POSITIVE muscle aches denies any associated abdominal pain, dysuria or hematuria  Objective  Blood pressure 132/90, pulse 65, height 6\' 5"  (1.956 m), weight 226 lb (102.5 kg), SpO2 98 %.   General: No apparent distress alert and oriented x3 mood and affect normal, dressed appropriately.  HEENT: Pupils equal, extraocular movements intact  Respiratory: Patient's speak in full sentences and does not appear short of breath  Cardiovascular: No lower extremity edema, non tender, no erythema  MSK:  Patient's low back has significant loss of lordosis with some very mild degenerative scoliosis.  Tightness with Pearlean Brownie on the right side compared to left.  Negative straight leg test but tightness of the hamstrings bilaterally.  Knee exam is a very mild varus deformity of the knees.  Good stability was noted.  Mild patellar grind testing bilaterally right greater than left.  Patient does have scarring from previous injury to the left side but I do not see any around the joint itself.  Mild tenderness to palpation of the medial joint space bilaterally.   Impression and Recommendations:     The above documentation has been reviewed and is accurate and complete Judi Saa, DO

## 2020-01-01 NOTE — Patient Instructions (Addendum)
Xray today Good to see you.  Ice 20 minutes 2 times daily. Usually after activity and before bed. Exercises 3 times a week.  Turmeric 500mg  daily  Tart cherry extract 1200mg  at night Vitamin D 2000 IU daily  See me again in 6-7 weeks If abdominal pain gets worse send Dr. a message. May want to consider CT abdomen with and without contrast

## 2020-01-01 NOTE — Assessment & Plan Note (Signed)
Patient did have chronic changes of the right knee previously on x-ray that did show some medial compartment osteoarthritic changes.  Patient does have what he says is fragmental on the contralateral side.  I would like patient to get x-rays today before we consider anything such as injections.  Discussed topical anti-inflammatories over-the-counter, and home exercises working on VMO strengthening.  Patient has fairly normal alignment and no significant instability at this time follow-up again in 6 months

## 2020-01-01 NOTE — Assessment & Plan Note (Signed)
Patient has had more of a right-sided low back pain.  Movement seems to be some mild radicular symptoms.  Patient has been given a muscle relaxer previously.  Patient will have dedicated x-rays done.  Did review patient's CT scan showing overall mild degenerative disc disease and facet arthropathy and a very mild grade 1 anterior listhesis on L4-L5.  Patient likely has hematuria seems to have resolved but is following up with urology in the near future.  We did discuss if any worsening pain I would consider the abdomen and pelvis CT with and without contrast: We will monitor patient's kidney function

## 2020-01-02 ENCOUNTER — Other Ambulatory Visit: Payer: Self-pay | Admitting: Family Medicine

## 2020-01-09 ENCOUNTER — Other Ambulatory Visit: Payer: Self-pay | Admitting: Family Medicine

## 2020-02-04 DIAGNOSIS — Z125 Encounter for screening for malignant neoplasm of prostate: Secondary | ICD-10-CM | POA: Diagnosis not present

## 2020-02-04 DIAGNOSIS — R310 Gross hematuria: Secondary | ICD-10-CM | POA: Diagnosis not present

## 2020-02-11 NOTE — Patient Instructions (Addendum)
Health Maintenance Due  Topic Date Due  . OPHTHALMOLOGY EXAM  Appointment scheduled for February . Have them send [place] a copy Never done  . COLONOSCOPY Has to call and have this scheduled. Pretty please call them back and get this set up.  Never done  . COVID-19 Vaccine (3 - Booster for Moderna series) Pt has had his booster, Will call [facility] and [facility] it in the system. 11/14/2019   Please stop by lab before you go If you have mychart- we will send your results within 3 business days of [place] receiving them.  If you do not have mychart- we will call you about results within 5 business days of [place] receiving them.  *please note we are currently using [facility] which has a longer processing time than [facility] typically so labs may not come back as quickly as in the past *please also note that you will see labs on mychart as soon as they post. I will later go in and write notes on them- will say "notes from Dr. Durene Cal"

## 2020-02-11 NOTE — Progress Notes (Signed)
Phone [phone] In person visit   Subjective:   Mark Jacinto Sr. is a 69 y.o. year old very pleasant male patient who presents for/with See problem oriented charting Chief Complaint  Patient presents with  . Hypertension    Pt takes his BP at home and his readings are normal   . Hyperlipidemia  . Diabetes    Pt is taking his blood sugars at home averages 107 and below    This visit occurred during the SARS-CoV-2 public health emergency.  Safety protocols were in place, including screening questions prior to the visit, additional usage of staff PPE, and extensive cleaning of exam room while observing appropriate contact time as indicated for disinfecting solutions.   Past Medical History-  Patient Active Problem List   Diagnosis Date Noted  . Diabetes mellitus type II, controlled (HCC) 11/25/2006    Priority: High  . Aortic atherosclerosis (HCC) 12/11/2019    Priority: Medium  . Hyperlipidemia associated with type 2 diabetes mellitus (HCC) 02/21/2017    Priority: Medium  . Essential hypertension 11/25/2006    Priority: Medium  . Erectile dysfunction 09/01/2014    Priority: Low  . Right-sided low back pain with right-sided sciatica 01/01/2020  . Bilateral knee pain 01/01/2020  . Anemia, chronic disease 07/14/2009    Medications- reviewed and updated Current Outpatient Medications  Medication Sig Dispense Refill  . gabapentin (NEURONTIN) 100 MG capsule Take 2 capsules (200 mg total) by mouth at bedtime. 180 capsule 0  . glipiZIDE (GLUCOTROL) 10 MG tablet Take 1 tablet (10 mg total) by mouth 2 (two) times daily before a meal. 180 tablet 3  . hydrochlorothiazide (HYDRODIURIL) 25 MG tablet Take 1 tablet (25 mg total) by mouth daily. 90 tablet 3  . metFORMIN (GLUCOPHAGE) 1000 MG tablet TAKE 1 TABLET BY MOUTH TWICE DAILY WITH A MEAL 180 tablet 3  . metoprolol succinate (TOPROL-XL) 100 MG 24 hr tablet Take with or immediately following a meal. 90 tablet 3  . sildenafil  (VIAGRA) 100 MG tablet Take 1 tablet as needed for erectile dysfunction up to every 72 hours 10 tablet 5  . simvastatin (ZOCOR) 10 MG tablet Daily 90 tablet 3  . tiZANidine (ZANAFLEX) 4 MG tablet TAKE 1 TABLET EVERY 6 HOURS AS NEEDED FOR MUSCLE SPASMS (DO NOT DRIVE FOR 6 HOURS AFTER TAKING). (Patient taking differently: Patient states that he only takes this medication at night, Not every 6 hours.) 90 tablet 1   No current facility-administered medications for this visit.     Objective:  BP 132/74   Pulse 64   Temp 98.4 F (36.9 C) (Temporal)   Ht 6\' 5"  (1.956 m)   Wt 227 lb (103 kg)   SpO2 99%   BMI 26.92 kg/m  Gen: NAD, resting comfortably CV: RRR no murmurs rubs or gallops Lungs: CTAB no crackles, wheeze, rhonchi  Ext: no edema Skin: warm, dry    Assessment and Plan   # Gross hematuria- had urology visit with Dr. recently. Plan is for cystoscopy later this month. Not sure if he will have to have another CT scan or not.   # Low back pain- working with Dr. Cardell Peach. Had visit earlier in the day. Started gabapentin as of today- hoping this will help and has follow up scheduled.   # Knee pain - has also seen Dr. Katrinka Blazing for his knee- prior shrapnel. Most of pain left knee but found loose ligament or tendon (he states pain is positional).   #  Diabetes S: Medication:Glipizide 10 mg twice daily, Metformin 1000 mg twice daily CBGs- does not check regularly Exercise and diet- riding stationary bike and bike outdoors Lab Results  Component Value Date   HGBA1C 6.1 (H) 10/15/2019  A/P: hopefully controlled- update a1c with labs today  #hypertension S: medication: Hydrochrorothiazide 25 mg, metoprolol 100 mg 24-hour Off ramipril due to hyperkalemia August 2021 BP Readings from Last 3 Encounters:  02/12/20 132/74  02/12/20 132/76  01/01/20 132/90  A/P: Stable. Continue current medications.   #hyperlipidemia/aortic atherosclerosis S: Medication:Simvastatin 10 mg Lab Results   Component Value Date   CHOL 112 10/15/2019   HDL 38 (L) 10/15/2019   LDLCALC 56 10/15/2019   LDLDIRECT 123.6 03/01/2008   TRIG 93 10/15/2019   CHOLHDL 2.9 10/15/2019  A/P: excellent control with LDL below 70 considering aortic atherosclerosis- continue current medicaiton  Recommended follow up: Return in about 4 months (around 06/12/2020) for follow up- or sooner if needed. Future Appointments  Date Time Provider Department Center  04/08/2020  2:00 PM Judi Saa, DO [id number] None    Lab/Order associations:   ICD-10-CM   1. Essential hypertension  I10   2. Controlled type 2 diabetes mellitus without complication, without long-term current use of insulin (HCC)  E11.9 COMPLETE METABOLIC PANEL WITH GFR    Hemoglobin A1c  3. Hyperlipidemia associated with type 2 diabetes mellitus (HCC)  E11.69    E78.5   4. Aortic atherosclerosis (HCC)  I70.0    Return precautions advised.  Tana Conch, MD

## 2020-02-12 ENCOUNTER — Other Ambulatory Visit: Payer: Self-pay

## 2020-02-12 ENCOUNTER — Encounter: Payer: Self-pay | Admitting: Family Medicine

## 2020-02-12 ENCOUNTER — Ambulatory Visit: Payer: Medicare PPO | Admitting: Family Medicine

## 2020-02-12 ENCOUNTER — Ambulatory Visit (INDEPENDENT_AMBULATORY_CARE_PROVIDER_SITE_OTHER): Payer: Medicare PPO | Admitting: Family Medicine

## 2020-02-12 VITALS — BP 132/74 | HR 64 | Temp 98.4°F | Ht 77.0 in | Wt 227.0 lb

## 2020-02-12 DIAGNOSIS — E1169 Type 2 diabetes mellitus with other specified complication: Secondary | ICD-10-CM | POA: Diagnosis not present

## 2020-02-12 DIAGNOSIS — I700 Atherosclerosis of aorta: Secondary | ICD-10-CM | POA: Diagnosis not present

## 2020-02-12 DIAGNOSIS — M5441 Lumbago with sciatica, right side: Secondary | ICD-10-CM | POA: Diagnosis not present

## 2020-02-12 DIAGNOSIS — E785 Hyperlipidemia, unspecified: Secondary | ICD-10-CM

## 2020-02-12 DIAGNOSIS — E119 Type 2 diabetes mellitus without complications: Secondary | ICD-10-CM | POA: Diagnosis not present

## 2020-02-12 DIAGNOSIS — G8929 Other chronic pain: Secondary | ICD-10-CM

## 2020-02-12 DIAGNOSIS — I10 Essential (primary) hypertension: Secondary | ICD-10-CM

## 2020-02-12 MED ORDER — GABAPENTIN 100 MG PO CAPS: 200.0000 mg | ORAL_CAPSULE | Freq: Every day | ORAL | 0 refills | Status: DC

## 2020-02-12 NOTE — Patient Instructions (Signed)
Gabapentin 100mg  at night for first week then 200mg  thereafter Take a step back on Zanaflex at night  Continue exercises Follow up with urology See me in 7-8 weeks

## 2020-02-12 NOTE — Assessment & Plan Note (Signed)
Patient's radicular symptoms seem to be improving.  We did start him on a low dose of gabapentin.  Warned of potential side effects but I do feel that 100 to 200 mg at night could be beneficial.  Patient will discontinue or decrease his Zanaflex to half the dose.  Continue home exercises and follow-up with me again in 6 to 8 weeks

## 2020-02-12 NOTE — Progress Notes (Signed)
[facility] [address] [place] [address] Phone: [phone] Subjective:   Mark Ali, am serving as a scribe for Dr. Antoine Primas. This visit occurred during the SARS-CoV-2 public health emergency.  Safety protocols were in place, including screening questions prior to the visit, additional usage of staff PPE, and extensive cleaning of exam room while observing appropriate contact time as indicated for disinfecting solutions.   I'm seeing this patient by the request  of:  Shelva Majestic, MD    [id number]   12/22/2019 Patient did have chronic changes of the right knee previously on x-ray that did show some medial compartment osteoarthritic changes.  Patient does have what he says is fragmental on the contralateral side.  I would like patient to get x-rays today before we consider anything such as injections.  Discussed topical anti-inflammatories over-the-counter, and home exercises working on VMO strengthening.  Patient has fairly normal alignment and no significant instability at this time follow-up again in 6 months  Patient has had more of a right-sided low back pain.  Movement seems to be some mild radicular symptoms.  Patient has been given a muscle relaxer previously.  Patient will have dedicated x-rays done.  Did review patient's CT scan showing overall mild degenerative disc disease and facet arthropathy and a very mild grade 1 anterior listhesis on L4-L5.  Patient likely has hematuria seems to have resolved but is following up with urology in the near future.  We did discuss if any worsening pain I would consider the abdomen and pelvis CT with and without contrast: We will monitor patient's kidney function   Update 02/12/2020 Mark Cunas Sr. is a 69 y.o. male coming in with complaint of bilateral knee pain and right sided lower back pain. Patient states that he continues to have pain in right side when he is getting ready  for school. Taking one Zanaflex every 6 hours. Pain is still there but is less frequent.   Pain in right knee continues to both him. Has shrapnel in left knee from [place] war. Is going to speak with Dr. Cleophas Dunker about knee pain.   New x-ray of the lumbar spine of the lumbar area shows the patient does have a grade 1 anterior listhesis of L4 on L5 with L4-L5 facet arthropathy   Bilateral standing knee x-rays show that patient does have chondrocalcinosis with moderate arthritic changes of the right knee left knee does have some mild to moderate arthritic changes and does appear to have a foreign body that is likely secondary to the history of prior shrapnel Past Medical History:  Diagnosis Date  . Diabetes type 2, controlled (HCC)   . Hyperlipidemia   . Hypertension   . UNSPECIFIED TACHYCARDIA 05/06/2007   Listed 05/06/2007- do not see recurrent tachycardia since that time    Past Surgical History:  Procedure Laterality Date  . MULTIPLE TOOTH EXTRACTIONS     Social History   Socioeconomic History  . Marital status: Married    Spouse name: Not on file  . Number of children: Not on file  . Years of education: Not on file  . Highest education level: Not on file  Occupational History  . Not on file  Tobacco Use  . Smoking status: Never Smoker  . Smokeless tobacco: Never Used  Vaping Use  . Vaping Use: Never used  Substance and Sexual Activity  . Alcohol use: No  . Drug use: No  . Sexual activity: Yes  Other Topics Concern  . Not on file  Social History Narrative   Married 3 children. Wife and son patient here. 2 grandkids.       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     [employer] undergrad   1978 [employer]- 4 degrees through 2000- [employer], math. 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    Social Determinants of Health   Financial Resource Strain: Not on file  Food Insecurity: Not on file  Transportation Needs: Not on file  Physical Activity: Not on file  Stress: Not on file  Social Connections: Not on file   No Known Allergies Family History  Problem Relation Age of Onset  . Diabetes Other   . Kidney disease Other   . Other Mother        respiratory failure died in 60s  . Alcohol abuse Father   . Lung disease Father        smoking  . Heart disease Sister        heart attack 3  . Alcohol abuse Brother   . Cirrhosis Brother   . Kidney disease Sister   . COPD Sister        led to death. procedural complication  . Diabetes Brother     Current Outpatient Medications (Endocrine & Metabolic):  .  glipiZIDE (GLUCOTROL) 10 MG tablet, Take 1 tablet (10 mg total) by mouth 2 (two) times daily before a meal. .  metFORMIN (GLUCOPHAGE) 1000 MG tablet, TAKE 1 TABLET BY MOUTH TWICE DAILY WITH A MEAL  Current Outpatient Medications (Cardiovascular):  .  hydrochlorothiazide (HYDRODIURIL) 25 MG tablet, Take 1 tablet (25 mg total) by mouth daily. .  metoprolol succinate (TOPROL-XL) 100 MG 24 hr tablet, Take with or immediately following a meal. .  sildenafil (VIAGRA) 100 MG tablet, Take 1 tablet as needed for erectile dysfunction up to every 72 hours .  simvastatin (ZOCOR) 10 MG tablet, Daily     Current Outpatient Medications (Other):  .  tiZANidine (ZANAFLEX) 4 MG tablet, TAKE 1 TABLET EVERY 6 HOURS AS NEEDED FOR MUSCLE SPASMS (DO NOT DRIVE FOR 6 HOURS AFTER TAKING). (Patient taking differently: Patient states that he only takes this medication at night, Not every 6 hours.) .  gabapentin  (NEURONTIN) 100 MG capsule, Take 2 capsules (200 mg total) by mouth at bedtime.   Reviewed prior external information including notes and imaging from  primary care provider As well as notes that were available from care everywhere and other healthcare systems.  Past medical history, social, surgical and family history all reviewed in electronic medical record.  No pertanent information unless stated regarding to the chief complaint.   Review of Systems:  No headache, visual changes, nausea, vomiting, diarrhea, constipation, dizziness, abdominal pain, skin rash, fevers, chills, night sweats, weight loss, swollen lymph nodes, body aches, joint swelling, chest pain, shortness of breath, mood changes. POSITIVE muscle aches  Objective  Blood pressure 132/76, pulse 67, height 6\' 5"  (1.956 m), weight 226 lb (102.5 kg), SpO2 99 %.   General: No apparent distress alert  and oriented x3 mood and affect normal, dressed appropriately.  HEENT: Pupils equal, extraocular movements intact  Respiratory: Patient's speak in full sentences and does not appear short of breath  Cardiovascular: No lower extremity edema, non tender, no erythema  Arthritic changes of multiple joints. Patient back does have some tightness noted.  Does have some loss of extension of the back mild pain with palpation of the right lower lumbar spine.  No tightness with Pearlean Brownie.  Negative straight leg test.    Impression and Recommendations:     The above documentation has been reviewed and is accurate and complete Judi Saa, DO

## 2020-02-13 ENCOUNTER — Other Ambulatory Visit: Payer: Self-pay | Admitting: Family Medicine

## 2020-02-13 LAB — COMPLETE METABOLIC PANEL WITH GFR
AG Ratio: 1.6 (calc) (ref 1.0–2.5)
ALT: 22 U/L (ref 9–46)
AST: 23 U/L (ref 10–35)
Albumin: 3.9 g/dL (ref 3.6–5.1)
Alkaline phosphatase (APISO): 65 U/L (ref 35–144)
BUN/Creatinine Ratio: 15 (calc) (ref 6–22)
BUN: 25 mg/dL (ref 7–25)
CO2: 26 mmol/L (ref 20–32)
Calcium: 9.3 mg/dL (ref 8.6–10.3)
Chloride: 105 mmol/L (ref 98–110)
Creat: 1.66 mg/dL — ABNORMAL HIGH (ref 0.70–1.18)
GFR, Est African American: 47 mL/min/{1.73_m2} — ABNORMAL LOW (ref >=60)
GFR, Est Non African American: 41 mL/min/{1.73_m2} — ABNORMAL LOW (ref >=60)
Globulin: 2.5 g/dL (calc) (ref 1.9–3.7)
Glucose, Bld: 92 mg/dL (ref 65–99)
Potassium: 5.1 mmol/L (ref 3.5–5.3)
Sodium: 139 mmol/L (ref 135–146)
Total Bilirubin: 0.4 mg/dL (ref 0.2–1.2)
Total Protein: 6.4 g/dL (ref 6.1–8.1)

## 2020-02-13 LAB — HEMOGLOBIN A1C
Hgb A1c MFr Bld: 6.7 % of total Hgb — ABNORMAL HIGH (ref <5.7)
Mean Plasma Glucose: 146 mg/dL
eAG (mmol/L): 8.1 mmol/L

## 2020-02-14 ENCOUNTER — Encounter: Payer: Self-pay | Admitting: Family Medicine

## 2020-02-29 DIAGNOSIS — R310 Gross hematuria: Secondary | ICD-10-CM | POA: Diagnosis not present

## 2020-03-16 ENCOUNTER — Other Ambulatory Visit: Payer: Self-pay | Admitting: Urology

## 2020-03-17 NOTE — Progress Notes (Signed)
DUE TO COVID-19 ONLY ONE VISITOR IS ALLOWED TO COME WITH YOU AND STAY IN THE WAITING ROOM ONLY DURING PRE OP AND PROCEDURE DAY OF SURGERY. THE 1 VISITOR  MAY VISIT WITH YOU AFTER SURGERY IN YOUR PRIVATE ROOM DURING VISITING HOURS ONLY!  YOU NEED TO HAVE A COVID 19 TEST ON_______ @_______ , THIS TEST MUST BE DONE BEFORE SURGERY,  COVID TESTING SITE [address] [place] , IT IS ON THE RIGHT GOING OUT [address] APPROXIMATELY  2 MINUTES PAST [employer] ON THE RIGHT. ONCE YOUR COVID TEST IS COMPLETED,  PLEASE BEGIN THE QUARANTINE INSTRUCTIONS AS OUTLINED IN YOUR HANDOUT.                Mark Reim Sr.  03/17/2020   Your procedure is scheduled on:    Report to [facility] Main  Entrance   Report to admitting at AM     Call this number if you have problems the morning of surgery [phone]    Remember: Do not eat food , candy gum or mints :After Midnight. You may have clear liquids from midnight until     CLEAR LIQUID DIET   Foods Allowed                                                                       Coffee and tea, regular and decaf                              Plain Jell-O any favor except red or purple                                            Fruit ices (not with fruit pulp)                                      Iced Popsicles                                     Carbonated beverages, regular and diet                                    Cranberry, grape and apple juices Sports drinks like Gatorade Lightly seasoned clear broth or consume(fat free) Sugar, honey syrup   _____________________________________________________________________    BRUSH YOUR TEETH MORNING OF SURGERY AND RINSE YOUR MOUTH OUT, NO CHEWING GUM CANDY OR MINTS.     Take these medicines the morning of surgery with A SIP OF WATER:   DO NOT TAKE ANY DIABETIC MEDICATIONS DAY OF YOUR SURGERY                               You may not have any metal  on your body including hair pins and  piercings  Do not wear jewelry, make-up, lotions, powders or perfumes, deodorant             Do not wear nail polish on your fingernails.  Do not shave  48 hours prior to surgery.              Men may shave face and neck.   Do not bring valuables to the hospital. [place].  Contacts, dentures or bridgework may not be worn into surgery.  Leave suitcase in the car. After surgery it may be brought to your room.     Patients discharged the day of surgery will not be allowed to drive home. IF YOU ARE HAVING SURGERY AND GOING HOME THE SAME DAY, YOU MUST HAVE AN ADULT TO DRIVE YOU HOME AND BE WITH YOU FOR 24 HOURS. YOU MAY GO HOME BY TAXI OR UBER OR ORTHERWISE, BUT AN ADULT MUST ACCOMPANY YOU HOME AND STAY WITH YOU FOR 24 HOURS.  Name and phone number of your driver:  Special Instructions: N/A              Please read over the following fact sheets you were given: _____________________________________________________________________  [facility] - Preparing for Surgery Before surgery, you can play an important role.  Because skin is not sterile, your skin needs to be as free of germs as possible.  You can reduce the number of germs on your skin by washing with CHG (chlorahexidine gluconate) soap before surgery.  CHG is an antiseptic cleaner which kills germs and bonds with the skin to continue killing germs even after washing. Please DO NOT use if you have an allergy to CHG or antibacterial soaps.  If your skin becomes reddened/irritated stop using the CHG and inform your nurse when you arrive at Short Stay. Do not shave (including legs and underarms) for at least 48 hours prior to the first CHG shower.  You may shave your face/neck. Please follow these instructions carefully:  1.  Shower with CHG Soap the night before surgery and the  morning of Surgery.  2.  If you choose to wash your hair, wash  your hair first as usual with your  normal  shampoo.  3.  After you shampoo, rinse your hair and body thoroughly to remove the  shampoo.                           4.  Use CHG as you would any other liquid soap.  You can apply chg directly  to the skin and wash                       Gently with a scrungie or clean washcloth.  5.  Apply the CHG Soap to your body ONLY FROM THE NECK DOWN.   Do not use on face/ open                           Wound or open sores. Avoid contact with eyes, ears mouth and genitals (private parts).                       Wash face,  Genitals (private parts) with your normal soap.             6.  Wash thoroughly, paying special attention to the area where your surgery  will be performed.  7.  Thoroughly rinse your body with warm water from the neck down.  8.  DO NOT shower/wash with your normal soap after using and rinsing off  the CHG Soap.                9.  Pat yourself dry with a clean towel.            10.  Wear clean pajamas.            11.  Place clean sheets on your bed the night of your first shower and do not  sleep with pets. Day of Surgery : Do not apply any lotions/deodorants the morning of surgery.  Please wear clean clothes to the hospital/surgery center.  FAILURE TO FOLLOW THESE INSTRUCTIONS MAY RESULT IN THE CANCELLATION OF YOUR SURGERY PATIENT SIGNATURE_________________________________  NURSE SIGNATURE__________________________________  ________________________________________________________________________            DUE TO COVID-19 ONLY ONE VISITOR IS ALLOWED TO COME WITH YOU AND STAY IN THE WAITING ROOM ONLY DURING PRE OP AND PROCEDURE DAY OF SURGERY. THE 1 VISITOR  MAY VISIT WITH YOU AFTER SURGERY IN YOUR PRIVATE ROOM DURING VISITING HOURS ONLY!  YOU NEED TO HAVE A COVID 19 TEST ON__1/18/2022 _____ @_______ , THIS TEST MUST BE DONE BEFORE SURGERY,  COVID TESTING SITE [address] [place] , IT IS ON THE RIGHT GOING OUT [address] APPROXIMATELY  2 MINUTES PAST [employer] ON THE RIGHT. ONCE YOUR COVID TEST IS COMPLETED,  PLEASE BEGIN THE QUARANTINE INSTRUCTIONS AS OUTLINED IN YOUR HANDOUT.                Mark Brayman Sr.  03/17/2020   Your procedure is scheduled on: 03/25/2020    Report to [facility] Main  Entrance   Report to admitting at     100pm     Call this number if you have problems the morning of surgery [phone]    Remember: Do not eat food , candy gum or mints :After Midnight. You may have clear liquids from midnight until 1200noon    CLEAR LIQUID DIET   Foods Allowed                                                                       Coffee and tea, regular and decaf                              Plain Jell-O any favor except red or purple                                            Fruit ices (not with fruit pulp)                                      Iced Popsicles  Carbonated beverages, regular and diet                                    Cranberry, grape and apple juices Sports drinks like Gatorade Lightly seasoned clear broth or consume(fat free) Sugar, honey syrup   _____________________________________________________________________    BRUSH YOUR TEETH MORNING OF SURGERY AND RINSE YOUR MOUTH OUT, NO CHEWING GUM CANDY OR MINTS.     Take these medicines the morning of surgery with A SIP OF WATER: toprol   DO NOT TAKE ANY DIABETIC MEDICATIONS DAY OF YOUR SURGERY                               You may not have any metal on your body including hair pins and              piercings  Do not wear jewelry, make-up, lotions, powders or perfumes, deodorant             Do not wear nail polish on your fingernails.  Do not shave  48 hours prior to surgery.              Men may shave face and neck.   Do not bring valuables to the hospital. [facility] IS NOT             RESPONSIBLE   FOR VALUABLES.  Contacts, dentures  or bridgework may not be worn into surgery.  Leave suitcase in the car. After surgery it may be brought to your room.     Patients discharged the day of surgery will not be allowed to drive home. IF YOU ARE HAVING SURGERY AND GOING HOME THE SAME DAY, YOU MUST HAVE AN ADULT TO DRIVE YOU HOME AND BE WITH YOU FOR 24 HOURS. YOU MAY GO HOME BY TAXI OR UBER OR ORTHERWISE, BUT AN ADULT MUST ACCOMPANY YOU HOME AND STAY WITH YOU FOR 24 HOURS.  Name and phone number of your driver:  Special Instructions: N/A              Please read over the following fact sheets you were given: _____________________________________________________________________  [facility] - Preparing for Surgery Before surgery, you can play an important role.  Because skin is not sterile, your skin needs to be as free of germs as possible.  You can reduce the number of germs on your skin by washing with CHG (chlorahexidine gluconate) soap before surgery.  CHG is an antiseptic cleaner which kills germs and bonds with the skin to continue killing germs even after washing. Please DO NOT use if you have an allergy to CHG or antibacterial soaps.  If your skin becomes reddened/irritated stop using the CHG and inform your nurse when you arrive at Short Stay. Do not shave (including legs and underarms) for at least 48 hours prior to the first CHG shower.  You may shave your face/neck. Please follow these instructions carefully:  1.  Shower with CHG Soap the night before surgery and the  morning of Surgery.  2.  If you choose to wash your hair, wash your hair first as usual with your  normal  shampoo.  3.  After you shampoo, rinse your hair and body thoroughly to remove the  shampoo.  4.  Use CHG as you would any other liquid soap.  You can apply chg directly  to the skin and wash                       Gently with a scrungie or clean washcloth.  5.  Apply the CHG Soap to your body ONLY FROM THE NECK DOWN.   Do not use  on face/ open                           Wound or open sores. Avoid contact with eyes, ears mouth and genitals (private parts).                       Wash face,  Genitals (private parts) with your normal soap.             6.  Wash thoroughly, paying special attention to the area where your surgery  will be performed.  7.  Thoroughly rinse your body with warm water from the neck down.  8.  DO NOT shower/wash with your normal soap after using and rinsing off  the CHG Soap.                9.  Pat yourself dry with a clean towel.            10.  Wear clean pajamas.            11.  Place clean sheets on your bed the night of your first shower and do not  sleep with pets. Day of Surgery : Do not apply any lotions/deodorants the morning of surgery.  Please wear clean clothes to the hospital/surgery center.  FAILURE TO FOLLOW THESE INSTRUCTIONS MAY RESULT IN THE CANCELLATION OF YOUR SURGERY PATIENT SIGNATURE_________________________________  NURSE SIGNATURE__________________________________  ________________________________________________________________________

## 2020-03-21 ENCOUNTER — Other Ambulatory Visit: Payer: Self-pay

## 2020-03-21 ENCOUNTER — Inpatient Hospital Stay (HOSPITAL_COMMUNITY)
Admission: RE | Admit: 2020-03-21 | Discharge: 2020-03-21 | Disposition: A | Discharge status: Home / Self Care | Payer: Medicare PPO | Source: Ambulatory Visit

## 2020-03-21 ENCOUNTER — Encounter (HOSPITAL_COMMUNITY): Payer: Self-pay

## 2020-03-21 NOTE — Progress Notes (Signed)
Anesthesia Review:  PCP: DR Tana Conch- LO)V 02/12/2020  Cardiologist : Chest x-ray : EKG :10/16/2019  Echo : Stress test: Cardiac Cath :  Activity level: can do a flight of stairs without difficulty  Sleep Study/ CPAP : Fasting Blood Sugar :      / Checks Blood Sugar -- times a day:   Blood Thinner/ Instructions /Last Dose: ASA / Instructions/ Last Dose :  hgba1c-12/10/-6.7

## 2020-03-22 ENCOUNTER — Encounter (HOSPITAL_COMMUNITY)
Admission: RE | Admit: 2020-03-22 | Discharge: 2020-03-22 | Disposition: A | Discharge status: Home / Self Care | Payer: Medicare PPO | Source: Ambulatory Visit | Attending: Family Medicine | Admitting: Family Medicine

## 2020-03-23 ENCOUNTER — Other Ambulatory Visit (HOSPITAL_COMMUNITY)
Admission: RE | Admit: 2020-03-23 | Discharge: 2020-03-23 | Disposition: A | Discharge status: Home / Self Care | Payer: Medicare PPO | Source: Ambulatory Visit | Attending: Urology | Admitting: Urology

## 2020-03-23 ENCOUNTER — Other Ambulatory Visit: Payer: Self-pay

## 2020-03-23 ENCOUNTER — Encounter (HOSPITAL_COMMUNITY)
Admission: RE | Admit: 2020-03-23 | Discharge: 2020-03-23 | Disposition: A | Discharge status: Home / Self Care | Payer: Medicare PPO | Source: Ambulatory Visit | Attending: Urology | Admitting: Urology

## 2020-03-23 DIAGNOSIS — Z01812 Encounter for preprocedural laboratory examination: Secondary | ICD-10-CM | POA: Insufficient documentation

## 2020-03-23 DIAGNOSIS — Z20822 Contact with and (suspected) exposure to covid-19: Secondary | ICD-10-CM | POA: Insufficient documentation

## 2020-03-23 LAB — BASIC METABOLIC PANEL WITH GFR
Anion gap: 8 (ref 5–15)
BUN: 21 mg/dL (ref 8–23)
CO2: 27 mmol/L (ref 22–32)
Calcium: 9.4 mg/dL (ref 8.9–10.3)
Chloride: 105 mmol/L (ref 98–111)
Creatinine, Ser: 1.55 mg/dL — ABNORMAL HIGH (ref 0.61–1.24)
GFR, Estimated: 48 mL/min — ABNORMAL LOW (ref >60)
Glucose, Bld: 67 mg/dL — ABNORMAL LOW (ref 70–99)
Potassium: 5.2 mmol/L — ABNORMAL HIGH (ref 3.5–5.1)
Sodium: 140 mmol/L (ref 135–145)

## 2020-03-23 LAB — CBC
HCT: 36.7 % — ABNORMAL LOW (ref 39.0–52.0)
Hemoglobin: 11.5 g/dL — ABNORMAL LOW (ref 13.0–17.0)
MCH: 31.5 pg (ref 26.0–34.0)
MCHC: 31.3 g/dL (ref 30.0–36.0)
MCV: 100.5 fL — ABNORMAL HIGH (ref 80.0–100.0)
Platelets: 233 10*3/uL (ref 150–400)
RBC: 3.65 MIL/uL — ABNORMAL LOW (ref 4.22–5.81)
RDW: 12.3 % (ref 11.5–15.5)
WBC: 4 10*3/uL (ref 4.0–10.5)
nRBC: 0 % (ref 0.0–0.2)

## 2020-03-23 LAB — SARS CORONAVIRUS 2 (TAT 6-24 HRS): SARS Coronavirus 2: NEGATIVE

## 2020-03-23 NOTE — Progress Notes (Addendum)
BMP routed to DR Gay Glucose 67 on lab  Results.  Called pt and pt ate after leaving preop lab appt.  Pt states he feels fine.   Potassium- 5.2 on lab results.   Sheilah Mins made aware glucose of 67 and action taken.  on BMP of 03/23/2020 and potassium of 5.2 No new orders given.

## 2020-03-25 ENCOUNTER — Encounter (HOSPITAL_COMMUNITY): Payer: Self-pay | Admitting: Urology

## 2020-03-25 ENCOUNTER — Ambulatory Visit (HOSPITAL_COMMUNITY): Payer: Medicare PPO | Admitting: Physician Assistant

## 2020-03-25 ENCOUNTER — Ambulatory Visit (HOSPITAL_COMMUNITY): Payer: Medicare PPO | Admitting: Anesthesiology

## 2020-03-25 ENCOUNTER — Encounter (HOSPITAL_COMMUNITY)
Admission: RE | Disposition: A | Discharge status: Home / Self Care | Payer: Self-pay | Source: Home / Self Care | Attending: Urology

## 2020-03-25 ENCOUNTER — Ambulatory Visit (HOSPITAL_COMMUNITY)
Admission: RE | Admit: 2020-03-25 | Discharge: 2020-03-25 | Disposition: A | Discharge status: Home / Self Care | Payer: Medicare PPO | Attending: Urology | Admitting: Urology

## 2020-03-25 ENCOUNTER — Ambulatory Visit (HOSPITAL_COMMUNITY): Payer: Medicare PPO

## 2020-03-25 DIAGNOSIS — N3289 Other specified disorders of bladder: Secondary | ICD-10-CM | POA: Diagnosis not present

## 2020-03-25 DIAGNOSIS — Z79899 Other long term (current) drug therapy: Secondary | ICD-10-CM | POA: Insufficient documentation

## 2020-03-25 DIAGNOSIS — Z7984 Long term (current) use of oral hypoglycemic drugs: Secondary | ICD-10-CM | POA: Insufficient documentation

## 2020-03-25 DIAGNOSIS — N369 Urethral disorder, unspecified: Secondary | ICD-10-CM

## 2020-03-25 DIAGNOSIS — N368 Other specified disorders of urethra: Secondary | ICD-10-CM | POA: Insufficient documentation

## 2020-03-25 DIAGNOSIS — R310 Gross hematuria: Secondary | ICD-10-CM | POA: Diagnosis not present

## 2020-03-25 DIAGNOSIS — E119 Type 2 diabetes mellitus without complications: Secondary | ICD-10-CM | POA: Diagnosis not present

## 2020-03-25 DIAGNOSIS — I10 Essential (primary) hypertension: Secondary | ICD-10-CM | POA: Diagnosis not present

## 2020-03-25 HISTORY — PX: CYSTOSCOPY WITH BIOPSY: SHX5122

## 2020-03-25 LAB — GLUCOSE, CAPILLARY
Glucose-Capillary: 136 mg/dL — ABNORMAL HIGH (ref 70–99)
Glucose-Capillary: 125 mg/dL — ABNORMAL HIGH (ref 70–99)

## 2020-03-25 SURGERY — CYSTOSCOPY, WITH BIOPSY
Anesthesia: General

## 2020-03-25 MED ORDER — CEPHALEXIN 500 MG PO CAPS: 500.0000 mg | ORAL_CAPSULE | Freq: Two times a day (BID) | ORAL | 0 refills | Status: AC

## 2020-03-25 MED ORDER — IOHEXOL 300 MG/ML  SOLN
INTRAMUSCULAR | Status: DC | PRN
Start: 2020-03-25 — End: 2020-03-25
  Administered 2020-03-25: 30 mL via URETHRAL

## 2020-03-25 MED ORDER — AMISULPRIDE (ANTIEMETIC) 5 MG/2ML IV SOLN
INTRAVENOUS | Status: AC
  Administered 2020-03-25: 5 mg via INTRAVENOUS
  Filled 2020-03-25: qty 2

## 2020-03-25 MED ORDER — ONDANSETRON HCL 4 MG/2ML IJ SOLN
INTRAMUSCULAR | Status: DC | PRN
  Administered 2020-03-25: 4 mg via INTRAVENOUS

## 2020-03-25 MED ORDER — LACTATED RINGERS IV SOLN: INTRAVENOUS | Status: DC

## 2020-03-25 MED ORDER — CHLORHEXIDINE GLUCONATE 0.12 % MT SOLN
15.0000 mL | Freq: Once | OROMUCOSAL | Status: AC
  Administered 2020-03-25: 15 mL via OROMUCOSAL

## 2020-03-25 MED ORDER — FENTANYL CITRATE (PF) 100 MCG/2ML IJ SOLN
INTRAMUSCULAR | Status: DC | PRN
  Administered 2020-03-25: 25 ug via INTRAVENOUS
  Administered 2020-03-25: 50 ug via INTRAVENOUS
  Administered 2020-03-25: 25 ug via INTRAVENOUS

## 2020-03-25 MED ORDER — ACETAMINOPHEN 500 MG PO TABS
1000.0000 mg | ORAL_TABLET | Freq: Once | ORAL | Status: AC
  Administered 2020-03-25: 1000 mg via ORAL
  Filled 2020-03-25: qty 2

## 2020-03-25 MED ORDER — DEXAMETHASONE SODIUM PHOSPHATE 10 MG/ML IJ SOLN
INTRAMUSCULAR | Status: DC | PRN
  Administered 2020-03-25: 4 mg via INTRAVENOUS

## 2020-03-25 MED ORDER — FENTANYL CITRATE (PF) 100 MCG/2ML IJ SOLN
INTRAMUSCULAR | Status: AC
  Filled 2020-03-25: qty 2

## 2020-03-25 MED ORDER — ONDANSETRON HCL 4 MG/2ML IJ SOLN
INTRAMUSCULAR | Status: AC
  Filled 2020-03-25: qty 2

## 2020-03-25 MED ORDER — PROPOFOL 10 MG/ML IV BOLUS
INTRAVENOUS | Status: DC | PRN
  Administered 2020-03-25: 150 mg via INTRAVENOUS

## 2020-03-25 MED ORDER — OXYCODONE-ACETAMINOPHEN 5-325 MG PO TABS: 1.0000 | ORAL_TABLET | ORAL | 0 refills | Status: AC | PRN

## 2020-03-25 MED ORDER — FENTANYL CITRATE (PF) 100 MCG/2ML IJ SOLN: 25.0000 ug | INTRAMUSCULAR | Status: DC | PRN

## 2020-03-25 MED ORDER — SODIUM CHLORIDE 0.9 % IR SOLN
Status: DC | PRN
  Administered 2020-03-25 (×2): 3000 mL

## 2020-03-25 MED ORDER — LIDOCAINE 2% (20 MG/ML) 5 ML SYRINGE
INTRAMUSCULAR | Status: DC | PRN
  Administered 2020-03-25: 60 mg via INTRAVENOUS

## 2020-03-25 MED ORDER — ORAL CARE MOUTH RINSE: 15.0000 mL | Freq: Once | OROMUCOSAL | Status: AC

## 2020-03-25 MED ORDER — DOCUSATE SODIUM 100 MG PO CAPS: 100.0000 mg | ORAL_CAPSULE | Freq: Every day | ORAL | 0 refills | Status: DC | PRN

## 2020-03-25 MED ORDER — AMISULPRIDE (ANTIEMETIC) 5 MG/2ML IV SOLN: 5.0000 mg | Freq: Once | INTRAVENOUS | Status: AC

## 2020-03-25 MED ORDER — CEFAZOLIN SODIUM-DEXTROSE 2-4 GM/100ML-% IV SOLN
2.0000 g | Freq: Once | INTRAVENOUS | Status: AC
  Administered 2020-03-25: 2 g via INTRAVENOUS
  Filled 2020-03-25: qty 100

## 2020-03-25 SURGICAL SUPPLY — 21 items
BAG DRN RND TRDRP ANRFLXCHMBR (UROLOGICAL SUPPLIES)
BAG URINE DRAIN 2000ML AR STRL (UROLOGICAL SUPPLIES) IMPLANT
BAG URO CATCHER STRL LF (MISCELLANEOUS) ×2 IMPLANT
CATH FOLEY 2WAY SLVR  5CC 18FR (CATHETERS) ×2
CATH FOLEY 2WAY SLVR 5CC 18FR (CATHETERS) IMPLANT
CATH URET 5FR 28IN OPEN ENDED (CATHETERS) ×1 IMPLANT
ELECT REM PT RETURN 15FT ADLT (MISCELLANEOUS) ×2 IMPLANT
EVACUATOR MICROVAS BLADDER (UROLOGICAL SUPPLIES) IMPLANT
GLOVE SURG ENC TEXT LTX SZ7.5 (GLOVE) ×2 IMPLANT
GOWN STRL REUS W/TWL LRG LVL3 (GOWN DISPOSABLE) ×2 IMPLANT
GUIDEWIRE STR DUAL SENSOR (WIRE) ×1 IMPLANT
KIT TURNOVER KIT A (KITS) ×1 IMPLANT
LOOP CUT BIPOLAR 24F LRG (ELECTROSURGICAL) ×1 IMPLANT
MANIFOLD NEPTUNE II (INSTRUMENTS) ×2 IMPLANT
PACK CYSTO (CUSTOM PROCEDURE TRAY) ×2 IMPLANT
PENCIL SMOKE EVACUATOR (MISCELLANEOUS) IMPLANT
SYR 10ML LL (SYRINGE) ×1 IMPLANT
SYR TOOMEY IRRIG 70ML (MISCELLANEOUS)
SYRINGE TOOMEY IRRIG 70ML (MISCELLANEOUS) IMPLANT
TUBING CONNECTING 10 (TUBING) ×2 IMPLANT
TUBING UROLOGY SET (TUBING) ×2 IMPLANT

## 2020-03-25 NOTE — Anesthesia Procedure Notes (Signed)
Procedure Name: LMA Insertion Performed by: Jayan Raymundo H, CRNA Pre-anesthesia Checklist: Patient identified, Emergency Drugs available, Suction available and Patient being monitored Patient Re-evaluated:Patient Re-evaluated prior to induction Oxygen Delivery Method: Circle System Utilized Preoxygenation: Pre-oxygenation with 100% oxygen Induction Type: IV induction Ventilation: Mask ventilation without difficulty LMA: LMA inserted LMA Size: 5.0 Number of attempts: 1 Airway Equipment and Method: Bite block Placement Confirmation: positive ETCO2 Tube secured with: Tape Dental Injury: Teeth and Oropharynx as per pre-operative assessment        

## 2020-03-25 NOTE — Anesthesia Postprocedure Evaluation (Signed)
Anesthesia Post Note  Patient: Mark Cunas Sr.  Procedure(s) Performed: CYSTOSCOPY WITH PROSTATE UERTHRAL  BIOPSY/ TRANSURETHRAL RESECTION OF PROSTATE/ BLADDER BIOPSY/ BILATERAL RETROGRADE/ URETEROSCOPY (N/A )     Patient location during evaluation: PACU Anesthesia Type: General Level of consciousness: awake and alert Pain management: pain level controlled Vital Signs Assessment: post-procedure vital signs reviewed and stable Respiratory status: spontaneous breathing, nonlabored ventilation and respiratory function stable Cardiovascular status: blood pressure returned to baseline and stable Postop Assessment: no apparent nausea or vomiting Anesthetic complications: no   No complications documented.  Last Vitals:  Vitals:   03/25/20 1615 03/25/20 1632  BP: (!) 143/69   Pulse: (!) 58   Resp: 12   Temp:  36.6 C  SpO2: 97%     Last Pain:  Vitals:   03/25/20 1632  TempSrc:   PainSc: 2                  Zakiah Beckerman,W. EDMOND

## 2020-03-25 NOTE — Transfer of Care (Signed)
Immediate Anesthesia Transfer of Care Note  Patient: Mark Cunas Sr.  Procedure(s) Performed: CYSTOSCOPY WITH PROSTATE UERTHRAL  BIOPSY/ TRANSURETHRAL RESECTION OF PROSTATE/ BLADDER BIOPSY/ BILATERAL RETROGRADE/ URETEROSCOPY (N/A )  Patient Location: PACU  Anesthesia Type:General  Level of Consciousness: awake  Airway & Oxygen Therapy: Patient Spontanous Breathing  Post-op Assessment: Report given to RN and Post -op Vital signs reviewed and stable  Post vital signs: Reviewed and stable  Last Vitals:  Vitals Value Taken Time  BP 139/76 03/25/20 1545  Temp    Pulse 62 03/25/20 1550  Resp 16 03/25/20 1550  SpO2 96 % 03/25/20 1550  Vitals shown include unvalidated device data.  Last Pain:  Vitals:   03/25/20 1327  TempSrc:   PainSc: 0-No pain      Patients Stated Pain Goal: 3 (03/25/20 1327)  Complications: No complications documented.

## 2020-03-25 NOTE — Anesthesia Preprocedure Evaluation (Addendum)
Anesthesia Evaluation  Patient identified by MRN, date of birth, ID band Patient awake    Reviewed: Allergy & Precautions, H&P , NPO status , Patient's Chart, lab work & pertinent test results, reviewed documented beta blocker date and time   Airway Mallampati: II  TM Distance: >3 FB Neck ROM: Full    Dental no notable dental hx. (+) Edentulous Upper, Partial Lower, Dental Advisory Given   Pulmonary neg pulmonary ROS,    Pulmonary exam normal breath sounds clear to auscultation       Cardiovascular hypertension, Pt. on medications and Pt. on home beta blockers  Rhythm:Regular Rate:Normal     Neuro/Psych negative neurological ROS  negative psych ROS   GI/Hepatic negative GI ROS, Neg liver ROS,   Endo/Other  diabetes, Type 2, Oral Hypoglycemic Agents  Renal/GU negative Renal ROS  negative genitourinary   Musculoskeletal   Abdominal   Peds  Hematology  (+) Blood dyscrasia, anemia ,   Anesthesia Other Findings   Reproductive/Obstetrics negative OB ROS                            Anesthesia Physical Anesthesia Plan  ASA: II  Anesthesia Plan: General   Post-op Pain Management:    Induction: Intravenous  PONV Risk Score and Plan: 3 and Ondansetron, Dexamethasone and Treatment may vary due to age or medical condition  Airway Management Planned: LMA  Additional Equipment:   Intra-op Plan:   Post-operative Plan: Extubation in OR  Informed Consent: I have reviewed the patients History and Physical, chart, labs and discussed the procedure including the risks, benefits and alternatives for the proposed anesthesia with the patient or authorized representative who has indicated his/her understanding and acceptance.     Dental advisory given  Plan Discussed with: CRNA  Anesthesia Plan Comments:         Anesthesia Quick Evaluation

## 2020-03-25 NOTE — Discharge Instructions (Signed)
Activity:  You are encouraged to ambulate frequently (about every hour during waking hours) to help prevent blood clots from forming in your legs or lungs.    Diet: You should advance your diet as instructed by your physician.  It will be normal to have some bloating, nausea, and abdominal discomfort intermittently.  Prescriptions:  You will be provided a prescription for pain medication to take as needed.  If your pain is not severe enough to require the prescription pain medication, you may take extra strength Tylenol instead which will have less side effects.  You should also take a prescribed stool softener to avoid straining with bowel movements as the prescription pain medication may constipate you.  What to call us about: You should call the office ([phone]) if you develop fever > 101 or develop persistent vomiting. Activity:  You are encouraged to ambulate frequently (about every hour during waking hours) to help prevent blood clots from forming in your legs or lungs.    You have a foley catheter in place. Return to clinic in 7 days for foley removal.  

## 2020-03-25 NOTE — Op Note (Signed)
Operative Note  Preoperative diagnosis:  1.  Gross hematuria 2. Prostatic urethral lesion  Postoperative diagnosis: 1.  Prostatic urethral lesion  Procedure(s): 1.  Cystoscopy 2. Bilateral retrograde pyelograms 3. Bladder biopsies (x4) 4. Prostatic urethral biopsy 5. Fulguration of prostate  Surgeon: Jettie Pagan, MD  Assistants:  None  Anesthesia:  General  Complications:  None  EBL:  Minimal  Specimens: 1.  ID Type Source Tests Collected by Time Destination  1 : RIGHT PROSTATIC URETHRAL LESION Tissue Prostate Biopsy SURGICAL PATHOLOGY Jannifer Hick, MD 03/25/2020 1507   2 : LEFT PROSTATIC URETHRAL LESION Tissue Prostate Biopsy SURGICAL PATHOLOGY Jannifer Hick, MD 03/25/2020 1508   3 : POSTERIOR BLADDER WALL GU Bladder Biopsy SURGICAL PATHOLOGY Jannifer Hick, MD 03/25/2020 1510   4 : LEFT LATERAL BLADDER WALL GU Bladder Biopsy SURGICAL PATHOLOGY Jannifer Hick, MD 03/25/2020 1511   5 : RIGHT LATERAL BLADDER WALL GU Bladder Biopsy SURGICAL PATHOLOGY Jannifer Hick, MD 03/25/2020 1511   6 : TRAGON  BLADDER WALL GU Bladder Biopsy SURGICAL PATHOLOGY Jannifer Hick, MD 03/25/2020 1512   7 : PROSTATIC URETHRAL LESION Tissue Prostate Biopsy SURGICAL PATHOLOGY Jannifer Hick, MD 03/25/2020 1519     Drains/Catheters: 1.  18 Fr foley catheter  Intraoperative findings:   1. Cystoscopy revealed bullous edematous with papillary lesion in the prostatic urethra just proximal to the external urethral sphincter.  We took a biopsy on the right and the left side density as a separate specimen.  There were no suspicious lesions within the bladder.  We took 4 separate bladder biopsies on the posterior aspect, right lateral wall, left lateral wall and trigone.  Excellent hemostasis was obtained. 2. On the posterior bladder wall biopsy with a rigid forcep, this biopsy was noted to be deep within the level of the muscle.  There was no evidence of perforation, however due to caution, I elected to leave  the Foley catheter in place for 7 days and perform a cystogram in 7 days. 3. Bilateral retrograde pyelograms demonstrated no filling defects, no areas of stenosis.  The calyces were thin and delicate and the contrast drained promptly. 4. Excellent hemostasis at the end of the case  Indication:  Mark Cunas Sr. is a 70 y.o. male who initially presented with gross hematuria.  He had a CT A/P noncontrast study for other reasons on 11/22/2019 without a cause of hematuria.  Cystoscopy 02/29/2020 revealed a less than 1 cm papillary lesion on the prostatic ureter at the base on the right lateral side.  Given this finding, we elected to proceed to the OR for cystoscopy, bilateral retrograde pyelograms as he did not have a contrasted study, prostatic urethral biopsy as well as bladder biopsies.  After thorough discussion including all relevant risk benefits alternatives, he presents to the operating today for this these procedures.  Description of procedure: After informed consent was obtained from the patient, the patient was identified and taken to the operating room and placed in the supine position.  General anesthesia was administered as well as perioperative IV antibiotics.  At the beginning of the case, a time-out was performed to properly identify the patient, the surgery to be performed, and the surgical site.  Sequential compression devices were applied to the lower extremities at the beginning of the case for DVT prophylaxis.  The patient was then placed in the dorsal lithotomy supine position, prepped and draped in sterile fashion.  We then passed the 21-French rigid cystoscope through  the urethra and into the bladder under vision without any difficulty.  We did note some friable edematous papillary appearing lesions along the prostatic urethra near the area of the external urethral sphincter.  The remainder of the prostate seen without evidence of suspicious lesions.  Cystoscopy did demonstrate  some erythema along the trigone however no other suspicious lesions.  Under cystoscopic and flouroscopic guidance, we cannulated the left ureteral orifice with a sensor wire and a 5-French open-ended ureteral catheter and a gentle retrograde pyelogram was performed, revealing a normal caliber ureter without any filling defects. There was no hydronephrosis of the collecting system. There was no filling defects.  The calyces were thin and delicate and the contrast drained promptly.  We also cannulated the right ureteral orifice with a sensor wire and a 5-French open-ended ureteral catheter and a gentle retrograde pyelogram was performed, revealing a normal caliber ureter without any filling defects. There was no hydronephrosis of the collecting system. There was no filling defects.  The calyces were thin and delicate and the contrast drained promptly.   We then proceeded to take bladder biopsies.  Using a rigid forcep, a posterior bladder biopsy was made.  Of note, this biopsy was made deep into the level of the muscle.  There is no evidence of perforation however it was deep.  We did fulgurate around this lesion to confirm hemostasis.  We also took biopsies of the left bladder wall, right lateral wall and trigone.  These areas were also fulgurated.  Excellent hemostasis was obtained.  I then took prostatic urethral biopsies with a rigid forcep of the area of concern which was a raised papillary lesion just proximal to the external urethral sphincter along the prostatic urethra.  I then used a loop to take a separate biopsy of prostatic urethral lesion.  I then fulgurated the areas of bleeding within the prostatic urethra.  We then examined the bladder again, there is no evidence of perforation.  Excellent hemostasis was obtained.  Urine was seen emanating from bilateral ureteral orifices.  I then removed the scope and placed an 8 French Foley catheter with return of clear yellow urine.  The patient tolerated  the procedure well and there was no complication. Patient was awoken from anesthesia and taken to the recovery room in stable condition. I was present and scrubbed for the entirety of the case.  Plan: We will discharge the patient home with Foley catheter in place given prostatic urethral biopsy as well as a deep biopsy of the posterior bladder wall.  As his biopsy was deep, we will leave Foley in place and plan for cystogram in 1 week in the office.  Matt R. Sydni Elizarraraz MD [facility]  Pager: [phone]
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
## 2020-03-26 ENCOUNTER — Encounter (HOSPITAL_COMMUNITY): Payer: Self-pay | Admitting: Urology

## 2020-03-29 LAB — SURGICAL PATHOLOGY

## 2020-03-31 ENCOUNTER — Ambulatory Visit: Payer: Medicare PPO | Admitting: Orthopaedic Surgery

## 2020-04-01 DIAGNOSIS — R310 Gross hematuria: Secondary | ICD-10-CM | POA: Diagnosis not present

## 2020-04-06 ENCOUNTER — Other Ambulatory Visit: Payer: Self-pay

## 2020-04-06 ENCOUNTER — Encounter: Payer: Self-pay | Admitting: Orthopaedic Surgery

## 2020-04-06 ENCOUNTER — Ambulatory Visit: Payer: Medicare PPO | Admitting: Orthopaedic Surgery

## 2020-04-06 VITALS — Ht 77.0 in | Wt 232.0 lb

## 2020-04-06 DIAGNOSIS — M170 Bilateral primary osteoarthritis of knee: Secondary | ICD-10-CM | POA: Insufficient documentation

## 2020-04-06 DIAGNOSIS — M25562 Pain in left knee: Secondary | ICD-10-CM | POA: Diagnosis not present

## 2020-04-06 DIAGNOSIS — M25561 Pain in right knee: Secondary | ICD-10-CM | POA: Diagnosis not present

## 2020-04-06 DIAGNOSIS — G8929 Other chronic pain: Secondary | ICD-10-CM | POA: Diagnosis not present
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
## 2020-04-08 ENCOUNTER — Encounter: Payer: Self-pay | Admitting: Family Medicine

## 2020-04-08 ENCOUNTER — Ambulatory Visit (INDEPENDENT_AMBULATORY_CARE_PROVIDER_SITE_OTHER): Payer: Medicare PPO | Admitting: Family Medicine

## 2020-04-08 ENCOUNTER — Other Ambulatory Visit: Payer: Self-pay

## 2020-04-08 DIAGNOSIS — G8929 Other chronic pain: Secondary | ICD-10-CM

## 2020-04-08 DIAGNOSIS — M5441 Lumbago with sciatica, right side: Secondary | ICD-10-CM

## 2020-04-08 DIAGNOSIS — M170 Bilateral primary osteoarthritis of knee: Secondary | ICD-10-CM

## 2020-04-08 NOTE — Progress Notes (Signed)
[facility] [address] [place] [address] Phone: [phone] Subjective:   Mark Ali, am serving as a scribe for Dr. Antoine Primas. This visit occurred during the SARS-CoV-2 public health emergency.  Safety protocols were in place, including screening questions prior to the visit, additional usage of staff PPE, and extensive cleaning of exam room while observing appropriate contact time as indicated for disinfecting solutions.   I'm seeing this patient by the request  of:  Shelva Majestic, MD  CC: low back pain follow up   MEQ:[id number]   02/12/2020 Patient's radicular symptoms seem to be improving.  We did start him on a low dose of gabapentin.  Warned of potential side effects but I do feel that 100 to 200 mg at night could be beneficial.  Patient will discontinue or decrease his Zanaflex to half the dose.  Continue home exercises and follow-up with me again in 6 to 8 weeks  Update 04/08/2020 Mark Cunas Sr. is a 70 y.o. male coming in with complaint of right sided low back pain. Patient states that he is doing better. Pain with heavy lifting in right side of lower back. Is using 200mg  of gabapentin.      saw dr for knees 04/06/20- Found that he has some arthritis otherwise was told that his knees look decent  Past Medical History:  Diagnosis Date  . Diabetes type 2, controlled (HCC)   . Hyperlipidemia   . Hypertension   . UNSPECIFIED TACHYCARDIA 05/06/2007   Listed 05/06/2007- do not see recurrent tachycardia since that time    Past Surgical History:  Procedure Laterality Date  . CYSTOSCOPY WITH BIOPSY N/A 03/25/2020   Procedure: CYSTOSCOPY WITH PROSTATE UERTHRAL  BIOPSY/ TRANSURETHRAL RESECTION OF PROSTATE/ BLADDER BIOPSY/ BILATERAL RETROGRADE/ URETEROSCOPY;  Surgeon: 03/27/2020, MD;  Location: [facility];  Service: Urology;  Laterality: N/A;  . left leg surgery      due to schrapnel   . MULTIPLE TOOTH EXTRACTIONS      Social History   Socioeconomic History  . Marital status: Married    Spouse name: Not on file  . Number of children: Not on file  . Years of education: Not on file  . Highest education level: Not on file  Occupational History  . Not on file  Tobacco Use  . Smoking status: Never Smoker  . Smokeless tobacco: Never Used  Vaping Use  . Vaping Use: Never used  Substance and Sexual Activity  . Alcohol use: No  . Drug use: No  . Sexual activity: Yes  Other Topics Concern  . Not on file  Social History Narrative   Married 3 children. Wife and son patient here. 2 grandkids.       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    [employer] undergrad   1978 [employer]- 4 degrees through 2000- 2001, math. Grad [employer], doctorate - Print production planner for 2 years. [employer] - 2 months.       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    Social Determinants of Health   Financial Resource Strain: Not on file  Food Insecurity: Not on file  Transportation Needs: Not on file  Physical Activity: Not on file  Stress: Not on file  Social Connections: Not on file   No Known Allergies Family History  Problem Relation Age of Onset  . Diabetes Other   . Kidney disease Other   . Other Mother        respiratory failure died in 44s  . Alcohol abuse Father   . Lung disease Father        smoking  . Heart disease Sister        heart attack 71  . Alcohol abuse Brother   . Cirrhosis Brother   . Kidney disease Sister   . COPD Sister        led to death. procedural complication  . Diabetes Brother     Current  Outpatient Medications (Endocrine & Metabolic):  .  glipiZIDE (GLUCOTROL) 10 MG tablet, Take 1 tablet (10 mg total) by mouth 2 (two) times daily before a meal. .  metFORMIN (GLUCOPHAGE) 1000 MG tablet, TAKE 1 TABLET BY MOUTH TWICE DAILY WITH A MEAL (Patient taking differently: Take 1,000 mg by mouth 2 (two) times daily with a meal.)  Current Outpatient Medications (Cardiovascular):  .  hydrochlorothiazide (HYDRODIURIL) 25 MG tablet, Take 1 tablet (25 mg total) by mouth daily. .  metoprolol succinate (TOPROL-XL) 100 MG 24 hr tablet, Take with or immediately following a meal. (Patient taking differently: Take 100 mg by mouth daily.) .  sildenafil (REVATIO) 20 MG tablet, Take 20-100 mg by mouth daily as needed (erectile dysfunction). .  sildenafil (VIAGRA) 50 MG tablet, Take 50-100 mg by mouth daily as needed for erectile dysfunction. .  simvastatin (ZOCOR) 10 MG tablet, Daily (Patient taking differently: Take 10 mg by mouth daily.)   Current Outpatient Medications (Analgesics):  .  acetaminophen (TYLENOL) 500 MG tablet, Take 1,000 mg by mouth every 6 (six) hours as needed for moderate pain.  Current Outpatient Medications (Hematological):  Marland Kitchen  Ferrous Sulfate (IRON PO), Take 1 tablet by mouth daily.  Current Outpatient Medications (Other):  Marland Kitchen  Ascorbic Acid (VITAMIN C PO), Take 1 tablet by mouth daily. Marland Kitchen  b complex vitamins capsule, Take 1 capsule by mouth daily. Marland Kitchen  CRANBERRY PO, Take 1 capsule by mouth daily. Marland Kitchen  docusate sodium (COLACE) 100 MG capsule, Take 1 capsule (100 mg total) by mouth daily as needed for up to 30 doses. Marland Kitchen  gabapentin (NEURONTIN) 100 MG capsule, Take 2 capsules (200 mg total) by mouth at bedtime. Marland Kitchen  GARLIC PO, Take 1 capsule by mouth daily. .  Saw Palmetto, Serenoa repens, (SAW PALMETTO PO), Take 1 tablet by mouth daily. .  TURMERIC PO, Take 1 capsule by mouth daily. Marland Kitchen  VITAMIN D PO, Take 1 capsule by mouth daily.   Reviewed prior external information including notes  and imaging from  primary care provider As well as notes that were available from care everywhere and other healthcare systems.  Past medical history, social, surgical and family history all reviewed in electronic medical record.  No pertanent information unless stated regarding to the chief complaint.   Review of Systems:  No headache, visual changes, nausea, vomiting, diarrhea, constipation, dizziness, abdominal pain, skin rash, fevers, chills, night sweats, weight loss, swollen lymph nodes, body aches, joint swelling, chest pain, shortness of breath, mood changes. POSITIVE muscle aches  Objective  Blood pressure 140/82, pulse 65, height 6\' 5"  (1.956 m), weight 224 lb (101.6 kg), SpO2 98 %.   General: No apparent distress alert and  oriented x3 mood and affect normal, dressed appropriately.  HEENT: Pupils equal, extraocular movements intact  Respiratory: Patient's speak in full sentences and does not appear short of breath  Cardiovascular: No lower extremity edema, non tender, no erythema  Gait normal with good balance and coordination.  MSK: Arthritic changes of multiple joints. Back exam shows the patient does have some very mild loss of lordosis.  Nontender on exam.  Sitting comfortably in his chair today.  Neurovascularly intact distally.   Impression and Recommendations:     The above documentation has been reviewed and is accurate and complete Judi Saa, DO

## 2020-04-08 NOTE — Patient Instructions (Signed)
Glad you are doing well Can try to go down to 100mg  on gabapentin See me in 3-6 months or as needed

## 2020-04-08 NOTE — Assessment & Plan Note (Signed)
Known arthritic changes.  Patient does doing well with conservative therapy as well.  Continue the same regimen at this time and if worsening symptoms can consider injections.

## 2020-04-08 NOTE — Assessment & Plan Note (Signed)
Patient is doing significantly better with the low-dose of the gabapentin.  We discussed with him as long as patient is doing so well and has not even thought about his back pain he can follow-up with me again in 3 to 6 months.  Patient is in agreement with the plan.

## 2020-06-18 LAB — HM DIABETES EYE EXAM

## 2020-06-24 ENCOUNTER — Ambulatory Visit: Payer: Medicare PPO | Admitting: Family Medicine

## 2020-07-18 DIAGNOSIS — E119 Type 2 diabetes mellitus without complications: Secondary | ICD-10-CM | POA: Diagnosis not present

## 2020-07-20 ENCOUNTER — Encounter: Payer: Self-pay | Admitting: Family Medicine

## 2020-07-21 ENCOUNTER — Ambulatory Visit: Payer: Medicare PPO | Admitting: Family Medicine

## 2020-08-06 ENCOUNTER — Other Ambulatory Visit: Payer: Self-pay | Admitting: Family Medicine

## 2020-08-06 DIAGNOSIS — E139 Other specified diabetes mellitus without complications: Secondary | ICD-10-CM

## 2020-08-22 ENCOUNTER — Other Ambulatory Visit: Payer: Self-pay | Admitting: Family Medicine

## 2020-09-05 ENCOUNTER — Other Ambulatory Visit: Payer: Self-pay | Admitting: Family Medicine

## 2020-09-05 DIAGNOSIS — E139 Other specified diabetes mellitus without complications: Secondary | ICD-10-CM

## 2020-09-06 ENCOUNTER — Ambulatory Visit: Payer: Medicare PPO | Admitting: Family Medicine

## 2020-09-06 ENCOUNTER — Other Ambulatory Visit: Payer: Self-pay

## 2020-09-06 MED ORDER — GABAPENTIN 100 MG PO CAPS: 200.0000 mg | ORAL_CAPSULE | Freq: Every day | ORAL | 0 refills | Status: DC

## 2020-09-06 NOTE — Telephone Encounter (Signed)
Left patient a message to see if he is using medication and at what dosage.

## 2020-09-09 ENCOUNTER — Other Ambulatory Visit: Payer: Self-pay

## 2020-09-09 ENCOUNTER — Ambulatory Visit: Payer: Medicare PPO | Admitting: Family Medicine

## 2020-09-09 ENCOUNTER — Encounter: Payer: Self-pay | Admitting: Family Medicine

## 2020-09-09 VITALS — BP 136/78 | HR 57 | Temp 98.5°F | Ht 77.0 in | Wt 225.8 lb

## 2020-09-09 DIAGNOSIS — I10 Essential (primary) hypertension: Secondary | ICD-10-CM

## 2020-09-09 DIAGNOSIS — E785 Hyperlipidemia, unspecified: Secondary | ICD-10-CM

## 2020-09-09 DIAGNOSIS — I700 Atherosclerosis of aorta: Secondary | ICD-10-CM | POA: Diagnosis not present

## 2020-09-09 DIAGNOSIS — E119 Type 2 diabetes mellitus without complications: Secondary | ICD-10-CM

## 2020-09-09 DIAGNOSIS — E1169 Type 2 diabetes mellitus with other specified complication: Secondary | ICD-10-CM

## 2020-09-09 NOTE — Progress Notes (Signed)
Phone [phone] In person visit   Subjective:   Mark Pepitone Sr. is a 70 y.o. year old very pleasant male patient who presents for/with See problem oriented charting Chief Complaint  Patient presents with   Hypertension   Hyperlipidemia   Diabetes    This visit occurred during the SARS-CoV-2 public health emergency.  Safety protocols were in place, including screening questions prior to the visit, additional usage of staff PPE, and extensive cleaning of exam room while observing appropriate contact time as indicated for disinfecting solutions.   Past Medical History-  Patient Active Problem List   Diagnosis Date Noted   Diabetes mellitus type II, controlled (HCC) 11/25/2006    Priority: High   Aortic atherosclerosis (HCC) 12/11/2019    Priority: Medium   Hyperlipidemia associated with type 2 diabetes mellitus (HCC) 02/21/2017    Priority: Medium   Essential hypertension 11/25/2006    Priority: Medium   Erectile dysfunction 09/01/2014    Priority: Low   Bilateral primary osteoarthritis of knee 04/06/2020   Right-sided low back pain with right-sided sciatica 01/01/2020   Bilateral knee pain 01/01/2020   Anemia, chronic disease 07/14/2009    Medications- reviewed and updated Current Outpatient Medications  Medication Sig Dispense Refill   acetaminophen (TYLENOL) 500 MG tablet Take 1,000 mg by mouth every 6 (six) hours as needed for moderate pain.     Ascorbic Acid (VITAMIN C PO) Take 1 tablet by mouth daily.     b complex vitamins capsule Take 1 capsule by mouth daily.     CRANBERRY PO Take 1 capsule by mouth daily.     Ferrous Sulfate (IRON PO) Take 1 tablet by mouth daily.     GARLIC PO Take 1 capsule by mouth daily.     glipiZIDE (GLUCOTROL) 10 MG tablet Take 1 tablet (10 mg total) by mouth 2 (two) times daily before a meal. 180 tablet 3   hydrochlorothiazide (HYDRODIURIL) 25 MG tablet TAKE 1 TABLET BY MOUTH EVERY DAY 90 tablet 0   metFORMIN (GLUCOPHAGE)  1000 MG tablet TAKE 1 TABLET BY MOUTH TWICE DAILY WITH A MEAL 180 tablet 0   metoprolol succinate (TOPROL-XL) 100 MG 24 hr tablet TAKE 1 TABLET BY MOUTH WITH OR IMMEDIATELY FOLLOWING A MEAL. 90 tablet 0   Saw Palmetto, Serenoa repens, (SAW PALMETTO PO) Take 1 tablet by mouth daily.     simvastatin (ZOCOR) 10 MG tablet TAKE 1 TABLET BY MOUTH EVERY DAY 90 tablet 3   TURMERIC PO Take 1 capsule by mouth daily.     VITAMIN D PO Take 1 capsule by mouth daily.     docusate sodium (COLACE) 100 MG capsule Take 1 capsule (100 mg total) by mouth daily as needed for up to 30 doses. (Patient not taking: Reported on 09/09/2020) 30 capsule 0   gabapentin (NEURONTIN) 100 MG capsule Take 2 capsules (200 mg total) by mouth at bedtime. (Patient not taking: Reported on 09/09/2020) 180 capsule 0   sildenafil (REVATIO) 20 MG tablet Take 20-100 mg by mouth daily as needed (erectile dysfunction). (Patient not taking: Reported on 09/09/2020)     sildenafil (VIAGRA) 50 MG tablet Take 50-100 mg by mouth daily as needed for erectile dysfunction. (Patient not taking: Reported on 09/09/2020)     No current facility-administered medications for this visit.     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 Continue current meds for now  #hypertension S: medication:  Hydrochlorothiazide 25 mg, metoprolol 100 mg 24-hour Off ramipril due to hyperkalemia August 2021 Home readings #s: <140 and in 70s diastolic BP Readings from Last 3 Encounters:  09/09/20 136/78  04/08/20 140/82  03/25/20 (!) 142/84  A/P: Stable. Continue current medications.   #hyperlipidemia #aortic atherosclerosis S: Medication:Simvastatin 10 mg - trying to increase exercise Lab Results  Component Value Date   CHOL 112 10/15/2019   HDL 38 (L) 10/15/2019   LDLCALC 56 10/15/2019   LDLDIRECT 123.6 03/01/2008   TRIG 93 10/15/2019   CHOLHDL 2.9 10/15/2019  A/P: hopefully stable- will come back for labs 1 year out from last labs  #hematuria- cystoscopy and CT scan were apparently reassuring. Did have a 1 cm papillary lesion in prostatic urethra- ended up with prostate urethral biopsy, fulguration of prostate, bladder biopsies- luckily benign  #Right low back pain-seen in the emergency department 09/26/2019.  Given short-term Voltaren and tizanidine. Has seen Dr. Katrinka Blazing in past- gabapentin helping last visit - as not been taking and still feels better overall and is going to see Dr. Katrinka Blazing next month- for rcheck  Recommended follow up: Return in about 5 months (around 02/09/2021) for physical or sooner if needed. Future Appointments  Date Time Provider Department Center  10/10/2020  3:00 PM Judi Saa, DO [facility] None    Lab/Order associations:   ICD-10-CM   1. Controlled type 2 diabetes mellitus without complication, without long-term current use of insulin (HCC)  E11.9 CBC With Differential/Platelet    COMPLETE METABOLIC PANEL WITH GFR    Lipid Panel w/reflex Direct LDL    Hemoglobin A1c    Microalbumin / creatinine urine ratio    2. Hyperlipidemia associated with type 2 diabetes mellitus (HCC)  E11.69 CBC With Differential/Platelet   E78.5 COMPLETE METABOLIC PANEL WITH GFR    Lipid Panel w/reflex Direct LDL    3. Essential hypertension  I10 CBC With Differential/Platelet     COMPLETE METABOLIC PANEL WITH GFR    Lipid Panel w/reflex Direct LDL    4. Aortic atherosclerosis (HCC) Chronic I70.0      Return precautions advised.  Tana Conch, MD

## 2020-09-09 NOTE — Patient Instructions (Addendum)
Health Maintenance Due  Topic Date Due   COLONOSCOPY- please call to get scheduled  [facility] contact Address: [address] [place], [place], [place] [address] Phone: [phone]  Never done   Zoster Vaccines- Shingrix (1 of 2) Please check with your pharmacy to see if they have the shingrix vaccine. If they do- please get this immunization and update [place] by phone call or mychart with dates you receive the vaccine  Never done   Schedule a lab visit at the check out desk after august 12th 2022. Return for future fasting labs meaning nothing but water after midnight please. Ok to take your medications with water.   Recommended follow up: Return in about 5 months (around 02/09/2021) for physical or sooner if needed.

## 2020-09-26 ENCOUNTER — Telehealth: Payer: Self-pay | Admitting: Podiatry

## 2020-09-26 NOTE — Telephone Encounter (Signed)
Patient stated they he has a hammer toe and there is a spot that is irritating it, he wanting to know if there is some type of ointment he can put on it

## 2020-09-26 NOTE — Telephone Encounter (Signed)
Triple antibiotic ointment and a bandaid. - Dr. Logan Bores

## 2020-10-03 ENCOUNTER — Other Ambulatory Visit: Payer: Self-pay

## 2020-10-03 ENCOUNTER — Ambulatory Visit: Payer: Medicare PPO | Admitting: Podiatry

## 2020-10-03 DIAGNOSIS — M2042 Other hammer toe(s) (acquired), left foot: Secondary | ICD-10-CM | POA: Diagnosis not present

## 2020-10-03 DIAGNOSIS — L989 Disorder of the skin and subcutaneous tissue, unspecified: Secondary | ICD-10-CM

## 2020-10-03 DIAGNOSIS — M21612 Bunion of left foot: Secondary | ICD-10-CM | POA: Diagnosis not present

## 2020-10-03 NOTE — Progress Notes (Signed)
   Subjective: 70 year old male with PMHx of T2DM presenting today for follow up evaluation of painful callus lesions noted to the bilateral feet. He also reports continued pain from a hammertoe of the left 2nd digit. He states the toe rubs against the great toe increases the pain. Walking and bearing weight increases the pain from the callus lesions. He has been using bandages, soaks and applying Neosporin for treatment. Patient is here for further evaluation and treatment.  Past Medical History:  Diagnosis Date   Diabetes type 2, controlled (HCC)    Hyperlipidemia    Hypertension    UNSPECIFIED TACHYCARDIA 05/06/2007   Listed 05/06/2007- do not see recurrent tachycardia since that time    Objective:  Physical Exam General: Alert and oriented x3 in no acute distress  Dermatology: Hyperkeratotic lesions present on the bilateral feet. Pain on palpation with a central nucleated core noted.  Skin is warm, dry and supple bilateral lower extremities. Negative for open lesions or macerations.  Vascular: Palpable pedal pulses bilaterally. No edema or erythema noted. Capillary refill within normal limits.  Neurological: Epicritic and protective threshold diminished bilaterally.   Musculoskeletal Exam: Pain on palpation at the keratotic lesion noted. Range of motion within normal limits bilateral. Muscle strength 5/5 in all groups bilateral. Clinical evidence of bunion deformity noted to the respective foot. There is moderate pain on palpation range of motion of the first MPJ. Lateral deviation of the hallux noted consistent with hallux abductovalgus. Hammertoe contracture also noted on clinical exam to the 2nd toe of the left foot. Symptomatic pain on palpation and range of motion also noted to the metatarsal phalangeal joints of the respective hammertoe digits.    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  #3 Silicone toe caps dispensed.  #4 Recommended good shoe gear.  #5 Return to clinic as needed for surgical consult.     Felecia Shelling, DPM [facility]  Dr. Felecia Shelling, DPM    [address]                                        [place], [place] [address]                Office [phone]  Fax [phone]

## 2020-10-10 ENCOUNTER — Other Ambulatory Visit: Payer: Self-pay

## 2020-10-10 ENCOUNTER — Encounter: Payer: Self-pay | Admitting: Family Medicine

## 2020-10-10 ENCOUNTER — Ambulatory Visit: Payer: Medicare PPO | Admitting: Family Medicine

## 2020-10-10 DIAGNOSIS — M5441 Lumbago with sciatica, right side: Secondary | ICD-10-CM

## 2020-10-10 DIAGNOSIS — G8929 Other chronic pain: Secondary | ICD-10-CM

## 2020-10-10 DIAGNOSIS — M170 Bilateral primary osteoarthritis of knee: Secondary | ICD-10-CM

## 2020-10-10 NOTE — Assessment & Plan Note (Signed)
Known arthritic changes right greater than left.  Patient does seems to be stable at the moment.  Patient denies any significant instability but would consider the possibility of custom bracing secondary to the abnormal thigh to calf ratio if necessary.  In addition to this we discussed the possibility of viscosupplementation if possible or if needed.  Follow-up with me again 2 to 3 months

## 2020-10-10 NOTE — Assessment & Plan Note (Signed)
Patient is making progress with him increasing his activity at the moment.  Discussed with patient that we could consider the possibility of advanced imaging with the facet arthropathy as well as the grade 1 anterior listhesis.  Patient wants to continue with conservative therapy.  No change in medication but does have the gabapentin.  Follow-up again in 2 to 3 months

## 2020-10-10 NOTE — Patient Instructions (Addendum)
Stay active See me in 2-3 months 

## 2020-10-10 NOTE — Progress Notes (Signed)
[facility] [address] [place] [address] Phone: [phone] Subjective:   Mark Ali, am serving as a scribe for Dr. Antoine Primas.  I'm seeing this patient by the request  of:  Shelva Majestic, MD  CC: Back pain follow-up  [id number]  04/08/2020 Known arthritic changes.  Patient does doing well with conservative therapy as well.  Continue the same regimen at this time and if worsening symptoms can consider injections.  Update 10/10/2020 Mark Cunas Sr. is a 70 y.o. male coming in with complaint of B knee and LBP. Patient states Right knee worse than left. 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 and mild to moderate arthritic changes of the left knee both more severe in the medial compartment  Past Medical History:  Diagnosis Date   Diabetes type 2, controlled (HCC)    Hyperlipidemia    Hypertension    UNSPECIFIED TACHYCARDIA 05/06/2007   Listed 05/06/2007- do not see recurrent tachycardia since that time    Past Surgical History:  Procedure Laterality Date   CYSTOSCOPY WITH BIOPSY N/A 03/25/2020   Procedure: CYSTOSCOPY WITH PROSTATE UERTHRAL  BIOPSY/ TRANSURETHRAL RESECTION OF PROSTATE/ BLADDER BIOPSY/ BILATERAL RETROGRADE/ URETEROSCOPY;  Surgeon: Jannifer Hick, MD;  Location: [facility];  Service: Urology;  Laterality: N/A;   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 Wife and son patient here. 2 grandkids.       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    [employer] undergrad   1978 [employer]- 4 degrees through 2000- [employer], math. 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    Social Determinants of Health   Financial Resource Strain: Not on file  Food Insecurity: Not on file  Transportation Needs: Not on file  Physical Activity: Not on file  Stress: Not on file  Social Connections: Not on file   No Known Allergies Family History  Problem Relation Age of Onset   Diabetes Other    Kidney disease  Other    Other Mother        respiratory failure died in 46s   Alcohol abuse Father    Lung disease Father        smoking   Heart disease Sister        heart attack 39   Alcohol abuse Brother    Cirrhosis Brother    Kidney disease Sister    COPD Sister        led to death. procedural complication   Diabetes Brother     Current Outpatient Medications (Endocrine & Metabolic):    glipiZIDE (GLUCOTROL) 10 MG tablet, Take 1 tablet (10 mg total) by mouth 2 (two) times daily before a meal.   metFORMIN (GLUCOPHAGE) 1000 MG tablet, TAKE 1 TABLET BY MOUTH TWICE DAILY  WITH A MEAL  Current Outpatient Medications (Cardiovascular):    hydrochlorothiazide (HYDRODIURIL) 25 MG tablet, TAKE 1 TABLET BY MOUTH EVERY DAY   metoprolol succinate (TOPROL-XL) 100 MG 24 hr tablet, TAKE 1 TABLET BY MOUTH WITH OR IMMEDIATELY FOLLOWING A MEAL.   simvastatin (ZOCOR) 10 MG tablet, TAKE 1 TABLET BY MOUTH EVERY DAY   sildenafil (REVATIO) 20 MG tablet, Take 20-100 mg by mouth daily as needed (erectile dysfunction). (Patient not taking: No sig reported)   sildenafil (VIAGRA) 50 MG tablet, Take 50-100 mg by mouth daily as needed for erectile dysfunction. (Patient not taking: No sig reported)   Current Outpatient Medications (Analgesics):    acetaminophen (TYLENOL) 500 MG tablet, Take 1,000 mg by mouth every 6 (six) hours as needed for moderate pain.  Current Outpatient Medications (Hematological):    Ferrous Sulfate (IRON PO), Take 1 tablet by mouth daily.  Current Outpatient Medications (Other):    Ascorbic Acid (VITAMIN C PO), Take 1 tablet by mouth daily.   b complex vitamins capsule, Take 1 capsule by mouth daily.   CRANBERRY PO, Take 1 capsule by mouth daily.   GARLIC PO, Take 1 capsule by mouth daily.   Saw Palmetto, Serenoa repens, (SAW PALMETTO PO), Take 1 tablet by mouth daily.   TURMERIC PO, Take 1 capsule by mouth daily.   VITAMIN D PO, Take 1 capsule by mouth daily.   docusate sodium (COLACE) 100 MG capsule, Take 1 capsule (100 mg total) by mouth daily as needed for up to 30 doses. (Patient not taking: No sig reported)   gabapentin (NEURONTIN) 100 MG capsule, Take 2 capsules (200 mg total) by mouth at bedtime. (Patient not taking: No sig reported)    Review of Systems:  No headache, visual changes, nausea, vomiting, diarrhea, constipation, dizziness, abdominal pain, skin rash, fevers, chills, night sweats, weight loss, swollen lymph nodes, body aches, joint swelling, chest pain, shortness of breath, mood changes. POSITIVE muscle aches  Objective  Blood  pressure 130/70, pulse 65, height 6\' 5"  (1.956 m), weight 225 lb (102.1 kg), SpO2 98 %.   General: No apparent distress alert and oriented x3 mood and affect normal, dressed appropriately.  HEENT: Pupils equal, extraocular movements intact  Respiratory: Patient's speak in full sentences and does not appear short of breath  Cardiovascular: No lower extremity edema, non tender, no erythema  Gait normal with good balance and coordination.  MSK: Knee pain shows the patient does have some mild tenderness to the right knee.  No significant instability noted today left knee also has some discomfort to palpation. Back exam does have some mild loss of lordosis.  Patient has negative today.  Sitting comfortably in his chair.  Impression and Recommendations:     The above documentation has been reviewed and is accurate and complete Lyndal Pulley, DO

## 2020-10-14 ENCOUNTER — Other Ambulatory Visit (INDEPENDENT_AMBULATORY_CARE_PROVIDER_SITE_OTHER): Payer: Medicare PPO

## 2020-10-14 ENCOUNTER — Ambulatory Visit (INDEPENDENT_AMBULATORY_CARE_PROVIDER_SITE_OTHER): Payer: Medicare PPO

## 2020-10-14 ENCOUNTER — Other Ambulatory Visit: Payer: Self-pay

## 2020-10-14 DIAGNOSIS — R319 Hematuria, unspecified: Secondary | ICD-10-CM | POA: Diagnosis not present

## 2020-10-14 DIAGNOSIS — Z0000 Encounter for general adult medical examination without abnormal findings: Secondary | ICD-10-CM

## 2020-10-14 DIAGNOSIS — E1169 Type 2 diabetes mellitus with other specified complication: Secondary | ICD-10-CM

## 2020-10-14 DIAGNOSIS — I10 Essential (primary) hypertension: Secondary | ICD-10-CM | POA: Diagnosis not present

## 2020-10-14 DIAGNOSIS — E785 Hyperlipidemia, unspecified: Secondary | ICD-10-CM

## 2020-10-14 DIAGNOSIS — Z1211 Encounter for screening for malignant neoplasm of colon: Secondary | ICD-10-CM

## 2020-10-14 DIAGNOSIS — E119 Type 2 diabetes mellitus without complications: Secondary | ICD-10-CM

## 2020-10-14 LAB — CBC WITH DIFFERENTIAL/PLATELET
Basophils Absolute: 0 10*3/uL (ref 0.0–0.1)
Basophils Relative: 0.9 % (ref 0.0–3.0)
Eosinophils Absolute: 0.2 10*3/uL (ref 0.0–0.7)
Eosinophils Relative: 3.8 % (ref 0.0–5.0)
HCT: 36.8 % — ABNORMAL LOW (ref 39.0–52.0)
Hemoglobin: 12.1 g/dL — ABNORMAL LOW (ref 13.0–17.0)
Lymphocytes Relative: 27.9 % (ref 12.0–46.0)
Lymphs Abs: 1.2 10*3/uL (ref 0.7–4.0)
MCHC: 32.8 g/dL (ref 30.0–36.0)
MCV: 96.1 fl (ref 78.0–100.0)
Monocytes Absolute: 0.3 10*3/uL (ref 0.1–1.0)
Monocytes Relative: 8.4 % (ref 3.0–12.0)
Neutro Abs: 2.5 10*3/uL (ref 1.4–7.7)
Neutrophils Relative %: 59 % (ref 43.0–77.0)
Platelets: 252 10*3/uL (ref 150.0–400.0)
RBC: 3.83 Mil/uL — ABNORMAL LOW (ref 4.22–5.81)
RDW: 13.6 % (ref 11.5–15.5)
WBC: 4.2 10*3/uL (ref 4.0–10.5)

## 2020-10-14 LAB — COMPLETE METABOLIC PANEL WITH GFR
AG Ratio: 1.5 (calc) (ref 1.0–2.5)
ALT: 12 U/L (ref 9–46)
AST: 15 U/L (ref 10–35)
Albumin: 4.1 g/dL (ref 3.6–5.1)
Alkaline phosphatase (APISO): 62 U/L (ref 35–144)
BUN/Creatinine Ratio: 13 (calc) (ref 6–22)
BUN: 20 mg/dL (ref 7–25)
CO2: 31 mmol/L (ref 20–32)
Calcium: 9.5 mg/dL (ref 8.6–10.3)
Chloride: 105 mmol/L (ref 98–110)
Creat: 1.5 mg/dL — ABNORMAL HIGH (ref 0.70–1.28)
Globulin: 2.7 g/dL (calc) (ref 1.9–3.7)
Glucose, Bld: 78 mg/dL (ref 65–99)
Potassium: 4.8 mmol/L (ref 3.5–5.3)
Sodium: 140 mmol/L (ref 135–146)
Total Bilirubin: 0.4 mg/dL (ref 0.2–1.2)
Total Protein: 6.8 g/dL (ref 6.1–8.1)
eGFR: 49 mL/min/{1.73_m2} — ABNORMAL LOW (ref >=60)

## 2020-10-14 LAB — MICROALBUMIN / CREATININE URINE RATIO
Creatinine,U: 115.2 mg/dL
Microalb Creat Ratio: 0.6 mg/g (ref 0.0–30.0)
Microalb, Ur: <0.7 mg/dL (ref 0.0–1.9)

## 2020-10-14 LAB — LIPID PANEL W/REFLEX DIRECT LDL
Cholesterol: 132 mg/dL (ref <200)
HDL: 42 mg/dL (ref >=40)
LDL Cholesterol (Calc): 71 mg/dL (calc)
Non-HDL Cholesterol (Calc): 90 mg/dL (calc) (ref <130)
Total CHOL/HDL Ratio: 3.1 (calc) (ref <5.0)
Triglycerides: 103 mg/dL (ref <150)

## 2020-10-14 LAB — HEMOGLOBIN A1C: Hgb A1c MFr Bld: 7 % — ABNORMAL HIGH (ref 4.6–6.5)

## 2020-10-14 NOTE — Patient Instructions (Signed)
Mark Ali , Thank you for taking time to come for your Medicare Wellness Visit. I appreciate your ongoing commitment to your health goals. Please review the following plan we discussed and let me know if I can assist you in the future.   Screening recommendations/referrals: Colonoscopy: order placed 10/14/20 Recommended yearly ophthalmology/optometry visit for glaucoma screening and checkup Recommended yearly dental visit for hygiene and checkup  Vaccinations: Influenza vaccine: Due  Pneumococcal vaccine: Completed  Tdap vaccine: Done 03/02/13 repeat in 10 years 03/03/23 Shingles vaccine: Completed 1st dose 10/01/20   Covid-19: Completed 2/11, 3/11, 12/10/19 & 08/10/20  Advanced directives: Advance directive discussed with you today. Even though you declined this today please call our office should you change your mind and we can give you the proper paperwork for you to fill out.  Conditions/risks identified: Get back exercise routine   Next appointment: Follow up in one year for your annual wellness visit.   Preventive Care 16 Years and Older, Male Preventive care refers to lifestyle choices and visits with your health care provider that can promote health and wellness. What does preventive care include? A yearly physical exam. This is also called an annual well check. Dental exams once or twice a year. Routine eye exams. Ask your health care provider how often you should have your eyes checked. Personal lifestyle choices, including: Daily care of your teeth and gums. Regular physical activity. Eating a healthy diet. Avoiding tobacco and drug use. Limiting alcohol use. Practicing safe sex. Taking low doses of aspirin every day. Taking vitamin and mineral supplements as recommended by your health care provider. What happens during an annual well check? The services and screenings done by your health care provider during your annual well check will depend on your age, overall health,  lifestyle risk factors, and family history of disease. Counseling  Your health care provider may ask you questions about your: Alcohol use. Tobacco use. Drug use. Emotional well-being. Home and relationship well-being. Sexual activity. Eating habits. History of falls. Memory and ability to understand (cognition). Work and work Astronomer. Screening  You may have the following tests or measurements: Height, weight, and BMI. Blood pressure. Lipid and cholesterol levels. These may be checked every 5 years, or more frequently if you are over 1 years old. Skin check. Lung cancer screening. You may have this screening every year starting at age 4 if you have a 30-pack-year history of smoking and currently smoke or have quit within the past 15 years. Fecal occult blood test (FOBT) of the stool. You may have this test every year starting at age 58. Flexible sigmoidoscopy or colonoscopy. You may have a sigmoidoscopy every 5 years or a colonoscopy every 10 years starting at age 25. Prostate cancer screening. Recommendations will vary depending on your family history and other risks. Hepatitis C blood test. Hepatitis B blood test. Sexually transmitted disease (STD) testing. Diabetes screening. This is done by checking your blood sugar (glucose) after you have not eaten for a while (fasting). You may have this done every 1-3 years. Abdominal aortic aneurysm (AAA) screening. You may need this if you are a current or former smoker. Osteoporosis. You may be screened starting at age 29 if you are at high risk. Talk with your health care provider about your test results, treatment options, and if necessary, the need for more tests. Vaccines  Your health care provider may recommend certain vaccines, such as: Influenza vaccine. This is recommended every year. Tetanus, diphtheria, and acellular pertussis (Tdap,  Td) vaccine. You may need a Td booster every 10 years. Zoster vaccine. You may need this  after age 49. Pneumococcal 13-valent conjugate (PCV13) vaccine. One dose is recommended after age 74. Pneumococcal polysaccharide (PPSV23) vaccine. One dose is recommended after age 71. Talk to your health care provider about which screenings and vaccines you need and how often you need them. This information is not intended to replace advice given to you by your health care provider. Make sure you discuss any questions you have with your health care provider. Document Released: 03/18/2015 Document Revised: 11/09/2015 Document Reviewed: 12/21/2014 Elsevier Interactive Patient Education  2017 [place] Prevention in the Home Falls can cause injuries. They can happen to people of all ages. There are many things you can do to make your home safe and to help prevent falls. What can I do on the outside of my home? Regularly fix the edges of walkways and driveways and fix any cracks. Remove anything that might make you trip as you walk through a door, such as a raised step or threshold. Trim any bushes or trees on the path to your home. Use bright outdoor lighting. Clear any walking paths of anything that might make someone trip, such as rocks or tools. Regularly check to see if handrails are loose or broken. Make sure that both sides of any steps have handrails. Any raised decks and porches should have guardrails on the edges. Have any leaves, snow, or ice cleared regularly. Use sand or salt on walking paths during winter. Clean up any spills in your garage right away. This includes oil or grease spills. What can I do in the bathroom? Use night lights. Install grab bars by the toilet and in the tub and shower. Do not use towel bars as grab bars. Use non-skid mats or decals in the tub or shower. If you need to sit down in the shower, use a plastic, non-slip stool. Keep the floor dry. Clean up any water that spills on the floor as soon as it happens. Remove soap buildup in the tub or  shower regularly. Attach bath mats securely with double-sided non-slip rug tape. Do not have throw rugs and other things on the floor that can make you trip. What can I do in the bedroom? Use night lights. Make sure that you have a light by your bed that is easy to reach. Do not use any sheets or blankets that are too big for your bed. They should not hang down onto the floor. Have a firm chair that has side arms. You can use this for support while you get dressed. Do not have throw rugs and other things on the floor that can make you trip. What can I do in the kitchen? Clean up any spills right away. Avoid walking on wet floors. Keep items that you use a lot in easy-to-reach places. If you need to reach something above you, use a strong step stool that has a grab bar. Keep electrical cords out of the way. Do not use floor polish or wax that makes floors slippery. If you must use wax, use non-skid floor wax. Do not have throw rugs and other things on the floor that can make you trip. What can I do with my stairs? Do not leave any items on the stairs. Make sure that there are handrails on both sides of the stairs and use them. Fix handrails that are broken or loose. Make sure that handrails are as  long as the stairways. Check any carpeting to make sure that it is firmly attached to the stairs. Fix any carpet that is loose or worn. Avoid having throw rugs at the top or bottom of the stairs. If you do have throw rugs, attach them to the floor with carpet tape. Make sure that you have a light switch at the top of the stairs and the bottom of the stairs. If you do not have them, ask someone to add them for you. What else can I do to help prevent falls? Wear shoes that: Do not have high heels. Have rubber bottoms. Are comfortable and fit you well. Are closed at the toe. Do not wear sandals. If you use a stepladder: Make sure that it is fully opened. Do not climb a closed stepladder. Make  sure that both sides of the stepladder are locked into place. Ask someone to hold it for you, if possible. Clearly mark and make sure that you can see: Any grab bars or handrails. First and last steps. Where the edge of each step is. Use tools that help you move around (mobility aids) if they are needed. These include: Canes. Walkers. Scooters. Crutches. Turn on the lights when you go into a dark area. Replace any light bulbs as soon as they burn out. Set up your furniture so you have a clear path. Avoid moving your furniture around. If any of your floors are uneven, fix them. If there are any pets around you, be aware of where they are. Review your medicines with your doctor. Some medicines can make you feel dizzy. This can increase your chance of falling. Ask your doctor what other things that you can do to help prevent falls. This information is not intended to replace advice given to you by your health care provider. Make sure you discuss any questions you have with your health care provider. Document Released: 12/16/2008 Document Revised: 07/28/2015 Document Reviewed: 03/26/2014 Elsevier Interactive Patient Education  2017 [employer].

## 2020-10-14 NOTE — Progress Notes (Addendum)
Virtual Visit via Telephone Note  I connected with  Mark CunasCharles Edward Wisor Sr. on 10/14/20 at  3:15 PM EDT by telephone and verified that I am speaking with the correct person using two identifiers.  Medicare Annual Wellness visit completed telephonically due to Covid-19 pandemic.   Persons participating in this call: This Health Coach and this patient.   Location: Patient: Home Provider: Office   I discussed the limitations, risks, security and privacy concerns of performing an evaluation and management service by telephone and the availability of in person appointments. The patient expressed understanding and agreed to proceed.  Unable to perform video visit due to video visit attempted and failed and/or patient does not have video capability.   Some vital signs may be absent or patient reported.   Marzella Schleinina H Doreatha Offer, LPN   Subjective:   Mark CunasCharles Edward Olejnik Sr. is a 70 y.o. male who presents for Medicare Annual/Subsequent preventive examination.  Review of Systems     Cardiac Risk Factors include: advanced age (>4555men, 78>65 women);hypertension;dyslipidemia;diabetes mellitus;male gender     Objective:    There were no vitals filed for this visit. There is no height or weight on file to calculate BMI.  Advanced Directives 10/14/2020 03/21/2020 01/08/2018  Does Patient Have a Medical Advance Directive? No No Yes  Type of Advance Directive - - Healthcare Power of Attorney  Does patient want to make changes to medical advance directive? - - No - Patient declined  Would patient like information on creating a medical advance directive? No - Patient declined - -    Current Medications (verified) Outpatient Encounter Medications as of 10/14/2020  Medication Sig   acetaminophen (TYLENOL) 500 MG tablet Take 1,000 mg by mouth every 6 (six) hours as needed for moderate pain.   Ascorbic Acid (VITAMIN C PO) Take 1 tablet by mouth daily.   b complex vitamins capsule Take 1 capsule by mouth  daily.   CRANBERRY PO Take 1 capsule by mouth daily.   Ferrous Sulfate (IRON PO) Take 1 tablet by mouth daily.   gabapentin (NEURONTIN) 100 MG capsule Take 2 capsules (200 mg total) by mouth at bedtime.   GARLIC PO Take 1 capsule by mouth daily.   glipiZIDE (GLUCOTROL) 10 MG tablet Take 1 tablet (10 mg total) by mouth 2 (two) times daily before a meal.   hydrochlorothiazide (HYDRODIURIL) 25 MG tablet TAKE 1 TABLET BY MOUTH EVERY DAY   metFORMIN (GLUCOPHAGE) 1000 MG tablet TAKE 1 TABLET BY MOUTH TWICE DAILY WITH A MEAL   metoprolol succinate (TOPROL-XL) 100 MG 24 hr tablet TAKE 1 TABLET BY MOUTH WITH OR IMMEDIATELY FOLLOWING A MEAL.   Saw Palmetto, Serenoa repens, (SAW PALMETTO PO) Take 1 tablet by mouth daily.   sildenafil (REVATIO) 20 MG tablet Take 20-100 mg by mouth daily as needed (erectile dysfunction).   sildenafil (VIAGRA) 50 MG tablet Take 50-100 mg by mouth daily as needed for erectile dysfunction.   simvastatin (ZOCOR) 10 MG tablet TAKE 1 TABLET BY MOUTH EVERY DAY   TURMERIC PO Take 1 capsule by mouth daily.   VITAMIN D PO Take 1 capsule by mouth daily.   docusate sodium (COLACE) 100 MG capsule Take 1 capsule (100 mg total) by mouth daily as needed for up to 30 doses. (Patient not taking: Reported on 10/14/2020)   No facility-administered encounter medications on file as of 10/14/2020.    Allergies (verified) Patient has no known allergies.   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 procedural complication   Diabetes Brother    Social History   Socioeconomic History   Marital status: Married    Spouse name: Not on file   Number of children: Not on file   Years of education: Not on file   Highest education level: Not on file  Occupational History   Not on file  Tobacco Use   Smoking status: Never   Smokeless tobacco: Never  Vaping Use   Vaping Use: Never used  Substance and Sexual Activity   Alcohol use: No   Drug use: No   Sexual activity: Yes  Other Topics Concern   Not on file  Social History Narrative   Married 3 children. Wife and son patient here. 2 grandkids.       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    [employer] undergrad   1978 [employer]- 4 degrees through 2000- [employer], math. 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    Social Determinants of Health   Financial Resource Strain: Low Risk    Difficulty of Paying Living Expenses: Not hard at all  Food Insecurity: No Food Insecurity   Worried About Programme researcher, broadcasting/film/video in the Last  Year: Never true   Ran Out of Food in the Last Year: Never true  Transportation Needs: No Transportation Needs   Lack of Transportation (Medical): No   Lack of Transportation (Non-Medical): No  Physical Activity: Sufficiently Active   Days of Exercise per Week: 5 days   Minutes of Exercise per Session: 50 min  Stress: No Stress Concern Present   Feeling of Stress : Not at all  Social Connections: Socially Integrated   Frequency of Communication with Friends and Family: More than three times a week   Frequency of Social Gatherings with Friends and Family: More than three times a week   Attends Religious Services: More  than 4 times per year   Active Member of Clubs or Organizations: Yes   Attends Banker Meetings: 1 to 4 times per year   Marital Status: Married    Tobacco Counseling Counseling given: Not Answered   Clinical Intake:  Pre-visit preparation completed: Yes  Pain : No/denies pain     BMI - recorded: 26.68 Nutritional Status: BMI 25 -29 Overweight Nutritional Risks: None Diabetes: Yes CBG done?: Yes (102) CBG resulted in Enter/ Edit results?: No Did pt. bring in CBG monitor from home?: No  How often do you need to have someone help you when you read instructions, pamphlets, or other written materials from your doctor or pharmacy?: 1 - Never  Diabetic?Nutrition Risk Assessment:  Has the patient had any N/V/D within the last 2 months?  No  Does the patient have any non-healing wounds?  No  Has the patient had any unintentional weight loss or weight gain?  No   Diabetes:  Is the patient diabetic?  Yes  If diabetic, was a CBG obtained today?  Yes  Did the patient bring in their glucometer from home?  No  How often do you monitor your CBG's? Daily .   Financial Strains and Diabetes Management:  Are you having any financial strains with the device, your supplies or your medication? No .  Does the patient want to be seen by Chronic Care  Management for management of their diabetes?  No  Would the patient like to be referred to a Nutritionist or for Diabetic Management?  No   Diabetic Exams:  Diabetic Eye Exam: Completed 06/18/20 Diabetic Foot Exam: Overdue, Pt has been advised about the importance in completing this exam. Pt is scheduled for diabetic foot exam on next appt .   Interpreter Needed?: No  Information entered by :: Lanier Ensign, LPN   Activities of Daily Living In your present state of health, do you have any difficulty performing the following activities: 10/14/2020 03/21/2020  Hearing? N N  Vision? N N  Difficulty concentrating or making decisions? N N  Walking or climbing stairs? N N  Dressing or bathing? N N  Doing errands, shopping? N N  Preparing Food and eating ? N -  Using the Toilet? N -  In the past six months, have you accidently leaked urine? N -  Do you have problems with loss of bowel control? N -  Managing your Medications? N -  Managing your Finances? N -  Housekeeping or managing your Housekeeping? N -  Some recent data might be hidden    Patient Care Team: Shelva Majestic, MD as PCP - General (Family Medicine)  Indicate any recent Medical Services you may have received from other than [facility] providers in the past year (date may be approximate).     Assessment:   This is a routine wellness examination for [place].  Hearing/Vision screen Hearing Screening - Comments:: Pt denies any hearing  Vision Screening - Comments:: Pt follow up with [facility] for annual eye exams   Dietary issues and exercise activities discussed: Current Exercise Habits: Home exercise routine, Type of exercise: Other - see comments, Time (Minutes): 45, Frequency (Times/Week): 5, Weekly Exercise (Minutes/Week): 225   Goals Addressed             This Visit's Progress    Patient Stated       Continue back on gym schedule       Depression Screen [facility] 2/9 Scores 10/14/2020 09/09/2020 10/15/2019  01/08/2018 02/21/2017 02/03/2016 04/29/2014  PHQ - 2 Score 0 0 0 0 0 0 0  PHQ- 9 Score - - 0 0 - - -    Fall Risk Fall Risk  10/14/2020 09/09/2020 10/15/2019 01/08/2018 02/21/2017  Falls in the past year? 0 0 0 0 No  Number falls in past yr: 0 0 0 - -  Injury with Fall? 0 0 0 - -  Risk for fall due to : Impaired vision No Fall Risks - - -  Follow up Falls prevention discussed Falls evaluation completed - - -    FALL RISK PREVENTION PERTAINING TO THE HOME:   Any stairs in or around the home? Yes  If so, are there any without handrails? No  Home free of loose throw rugs in walkways, pet beds, electrical cords, etc? Yes  Adequate lighting in your home to reduce risk of falls? Yes   ASSISTIVE DEVICES UTILIZED TO PREVENT FALLS:  Life alert? Yes  Use of a cane, walker or w/c? No  Grab bars in the bathroom? No  Shower chair or bench in shower? No  Elevated toilet seat or a handicapped toilet? No   TIMED UP AND GO:  Was the test performed? No .   Cognitive Function:     6CIT Screen 10/14/2020 01/08/2018  What Year? 0 points 0 points  What month? 0 points 0 points  What time? 0 points 0 points  Count back from 20 0 points 0 points  Months in reverse 0 points 0 points  Repeat phrase 0 points 0 points  Total Score 0 0    Immunizations Immunization History  Administered Date(s) Administered   Fluad Quad(high Dose 65+) 10/31/2018, 11/05/2019   Influenza Split 03/05/2011   Influenza Whole 03/05/2005   Influenza, High Dose Seasonal PF 02/21/2017, 01/08/2018   Influenza,inj,Quad PF,6+ Mos 04/29/2014   Moderna Sars-Covid-2 Vaccination 04/16/2019, 05/14/2019, 12/10/2019, 08/10/2020   Pneumococcal Conjugate-13 09/01/2014   Pneumococcal Polysaccharide-23 03/05/2006, 01/04/2015, 02/02/2016   Td 03/06/1999   Tdap 03/05/2011, 03/02/2013   Zoster Recombinat (Shingrix) 10/01/2020   Zoster, Live 09/03/2014    TDAP status: Up to date  Flu Vaccine status: Due, Education has been provided  regarding the importance of this vaccine. Advised may receive this vaccine at local pharmacy or Health Dept. Aware to provide a copy of the vaccination record if obtained from local pharmacy or Health Dept. Verbalized acceptance and understanding.  Pneumococcal vaccine status: Up to date  Covid-19 vaccine status: Completed vaccines  Qualifies for Shingles Vaccine? Yes   Zostavax completed No   Shingrix Completed?: Yes  Screening Tests Health Maintenance  Topic Date Due   COLONOSCOPY (Pts 45-19yrs Insurance coverage will need to be confirmed)  Never done   INFLUENZA VACCINE  10/03/2020   FOOT EXAM  10/14/2020   Zoster Vaccines- Shingrix (2 of 2) 11/26/2020   HEMOGLOBIN A1C  04/16/2021   OPHTHALMOLOGY EXAM  06/18/2021   URINE MICROALBUMIN  10/14/2021   TETANUS/TDAP  03/03/2023   COVID-19 Vaccine  Completed   Hepatitis C Screening  Completed   PNA vac Low Risk Adult  Completed   HPV VACCINES  Aged Out    Health Maintenance  Health Maintenance Due  Topic Date Due   COLONOSCOPY (Pts 45-85yrs Insurance coverage will need to be confirmed)  Never done   INFLUENZA VACCINE  10/03/2020   FOOT EXAM  10/14/2020    Colorectal cancer screening: Referral to GI placed 10/14/20. Pt aware the office will call re: appt.  Additional Screening:  Hepatitis C Screening:  Completed 03/14/17  Vision Screening: Recommended annual ophthalmology exams for early detection of glaucoma and other disorders of the eye. Is the patient up to date with their annual eye exam?  Yes  Who is the provider or what is the name of the office in which the patient attends annual eye exams? [facility] If pt is not established with a provider, would they like to be referred to a provider to establish care? No .   Dental Screening: Recommended annual dental exams for proper oral hygiene  Community Resource Referral / Chronic Care Management: CRR required this visit?  No   CCM required this visit?  No       Plan:     I have personally reviewed and noted the following in the patient's chart:   Medical and social history Use of alcohol, tobacco or illicit drugs  Current medications and supplements including opioid prescriptions. Patient is not currently taking opioid prescriptions. Functional ability and status Nutritional status Physical activity Advanced directives List of other physicians Hospitalizations, surgeries, and ER visits in previous 12 months Vitals Screenings to include cognitive, depression, and falls Referrals and appointments  In addition, I have reviewed and discussed with patient certain preventive protocols, quality metrics, and best practice recommendations. A written personalized care plan for preventive services as well as general preventive health recommendations were provided to patient.     Marzella Schlein, LPN   7/41/4239   Nurse Notes: None

## 2020-10-15 ENCOUNTER — Encounter: Payer: Self-pay | Admitting: Family Medicine

## 2020-10-19 ENCOUNTER — Telehealth: Payer: Self-pay | Admitting: Podiatry

## 2020-10-19 NOTE — Telephone Encounter (Signed)
Patient called to speak to Dr. Logan Bores, he stated that he has a toe resting on top of his great toe and sometimes it creates some type of broken skin. He keeps bandages on it to keep it from infections. But he would like to know what type of ointment he can use or what is his other treatment options.

## 2020-10-20 NOTE — Telephone Encounter (Signed)
Patient calling to request a call back in regard to hammertoe.

## 2020-10-26 ENCOUNTER — Ambulatory Visit (INDEPENDENT_AMBULATORY_CARE_PROVIDER_SITE_OTHER): Payer: Medicare PPO | Admitting: Podiatry

## 2020-10-26 ENCOUNTER — Other Ambulatory Visit: Payer: Self-pay

## 2020-10-26 DIAGNOSIS — M21612 Bunion of left foot: Secondary | ICD-10-CM | POA: Diagnosis not present

## 2020-10-26 DIAGNOSIS — M2042 Other hammer toe(s) (acquired), left foot: Secondary | ICD-10-CM

## 2020-10-26 NOTE — Progress Notes (Signed)
   Subjective: 70 year old male with PMHx of T2DM presenting today for follow up evaluation of painful callus lesions noted to the bilateral feet. He also reports continued pain from a hammertoe of the left 2nd digit. He states the toe rubs against the great toe increases the pain.  Patient noticed over the past week that he did get some superficial skin breakdown of the great toe secondary to the second toe rubbing against the skin of the big toe.  He would like to have it evaluated today  Past Medical History:  Diagnosis Date   Diabetes type 2, controlled (HCC)    Hyperlipidemia    Hypertension    UNSPECIFIED TACHYCARDIA 05/06/2007   Listed 05/06/2007- do not see recurrent tachycardia since that time    Objective:  Physical Exam General: Alert and oriented x3 in no acute distress  Dermatology: Hyperkeratotic lesions present on the bilateral feet. Pain on palpation with a central nucleated core noted.  Skin is warm, dry and supple bilateral lower extremities. Negative for open lesions or macerations.  Vascular: Palpable pedal pulses bilaterally. No edema or erythema noted. Capillary refill within normal limits.  Neurological: Epicritic and protective threshold diminished bilaterally.   Musculoskeletal Exam: Pain on palpation at the keratotic lesion noted. Range of motion within normal limits bilateral. Muscle strength 5/5 in all groups bilateral. Clinical evidence of bunion deformity noted to the respective foot. There is moderate pain on palpation range of motion of the first MPJ. Lateral deviation of the hallux noted consistent with hallux abductovalgus. Hammertoe contracture also noted on clinical exam to the 2nd toe of the left foot. Symptomatic pain on palpation and range of motion also noted to the metatarsal phalangeal joints of the respective hammertoe digits.    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   Plan of Care:  #1 Patient evaluated #2 Continue conservative treatment at this time.  #3 continue silicone toe caps #4 Recommended good shoe gear that does not constrict the toebox area.  #5 Silvadene cream was provided for the patient today to apply to the nail plate area of the hallux #6 return to clinic as needed   Felecia Shelling, DPM [facility]  Dr. Felecia Shelling, DPM    [address]                                        [place], [place] [address]                Office [phone]  Fax [phone]

## 2020-11-13 IMAGING — CT CT RENAL STONE PROTOCOL
2 of 4 series · 16 of 46 positions shown, 18 images · non-contrast
Comparison: None.

CLINICAL DATA: Hematuria.

EXAM:
CT ABDOMEN AND PELVIS WITHOUT CONTRAST
TECHNIQUE: Multidetector CT imaging of the abdomen and pelvis was performed
following the standard protocol without IV contrast.

[Series 2: stone study 5.0 br38 1 · axial · 0.78mm/px · z∈[+834,+1320]mm · 13 of 107 slices shown, 15 images]
[im 5/107  soft-tissue]
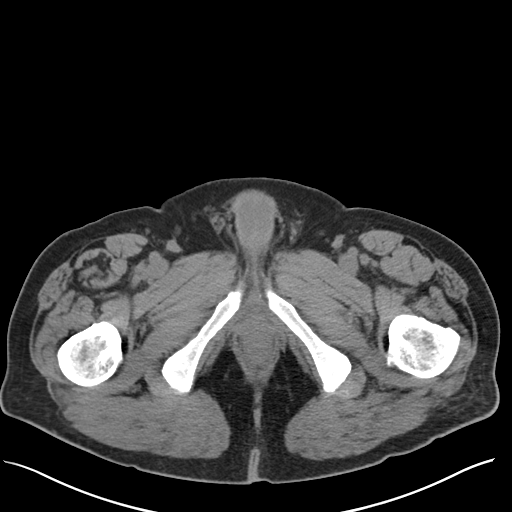
[im 5/107  bone]
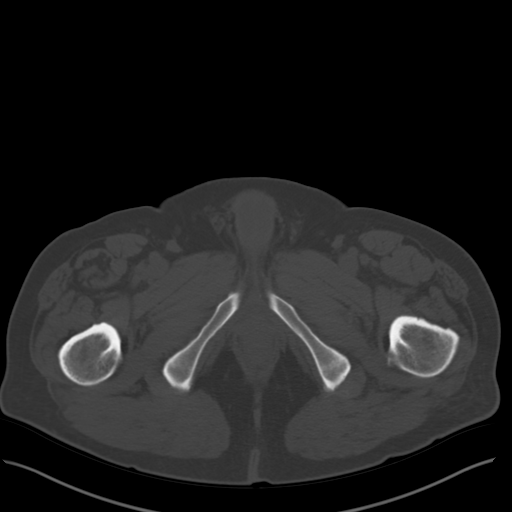
[im 15/107  soft-tissue]
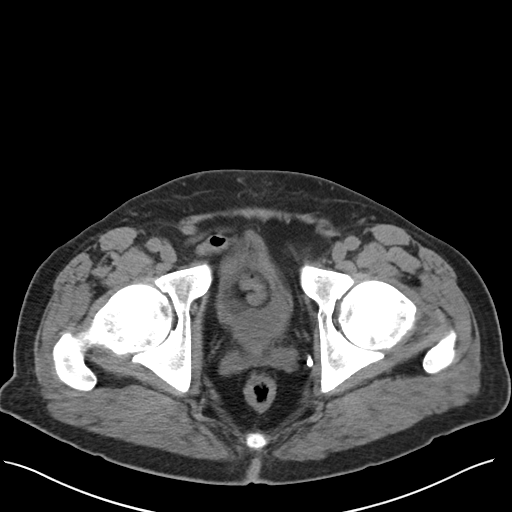
[im 25/107  soft-tissue]
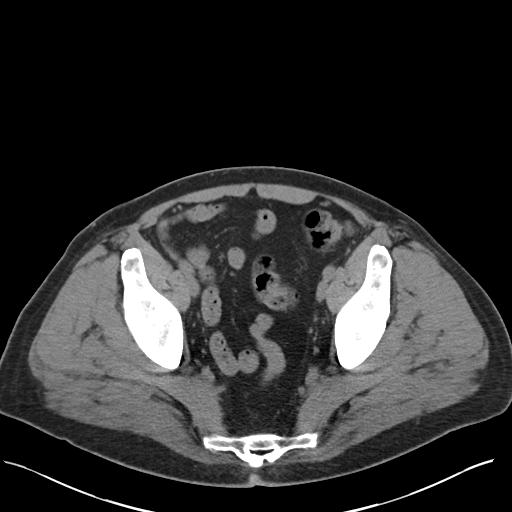
[im 29/107  soft-tissue]
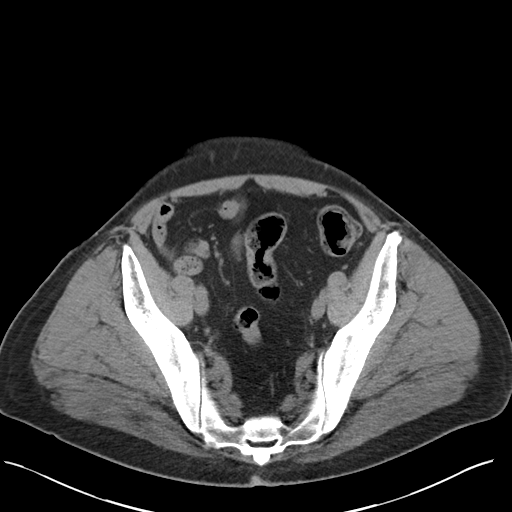
[im 39/107  soft-tissue]
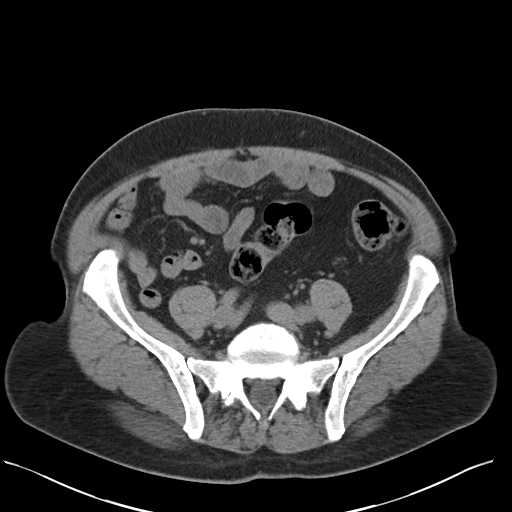
[im 44/107  soft-tissue]
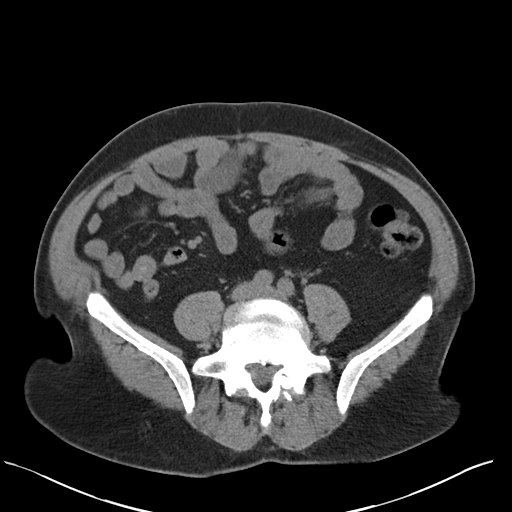
[im 54/107  soft-tissue]
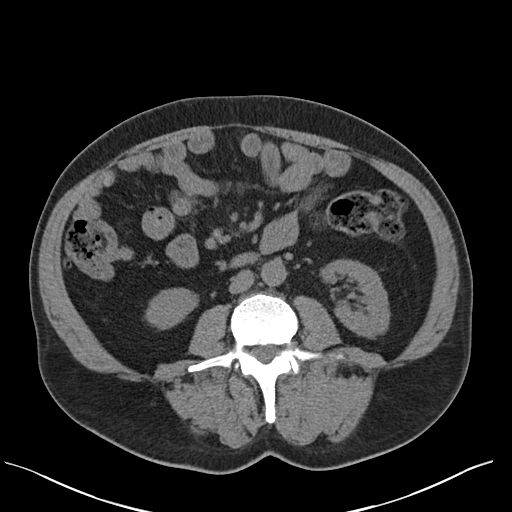
[im 63/107  soft-tissue]
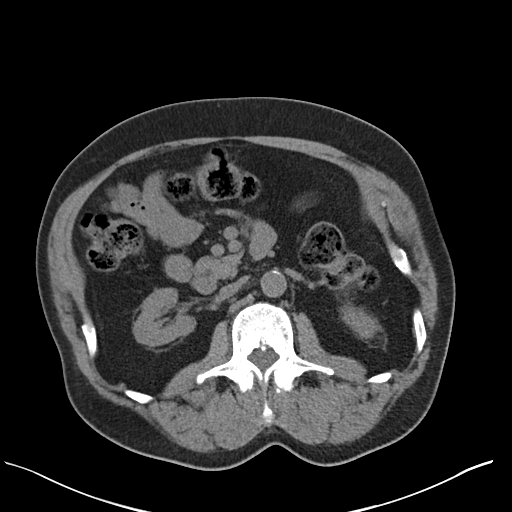
[im 68/107  soft-tissue]
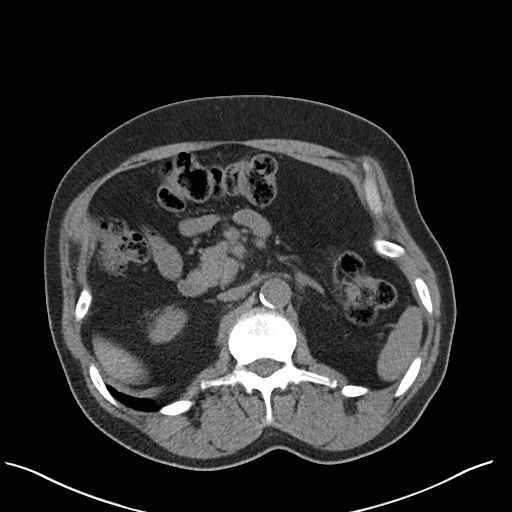
[im 68/107  bone]
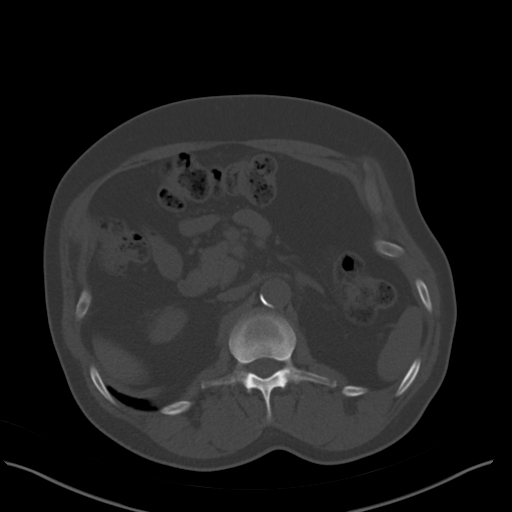
[im 78/107  soft-tissue]
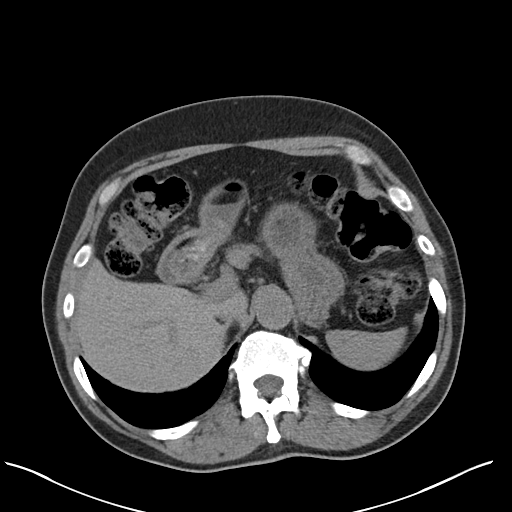
[im 82/107  soft-tissue]
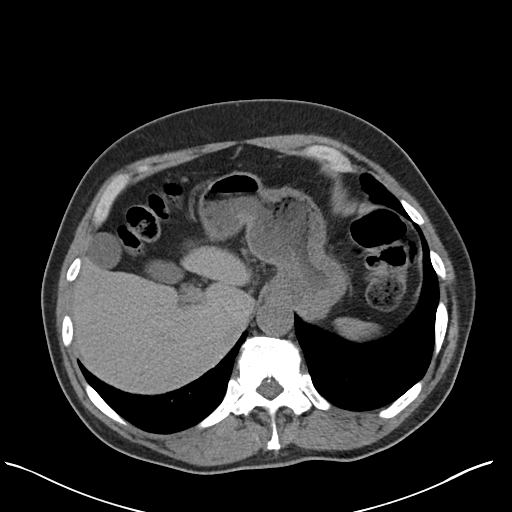
[im 92/107  soft-tissue]
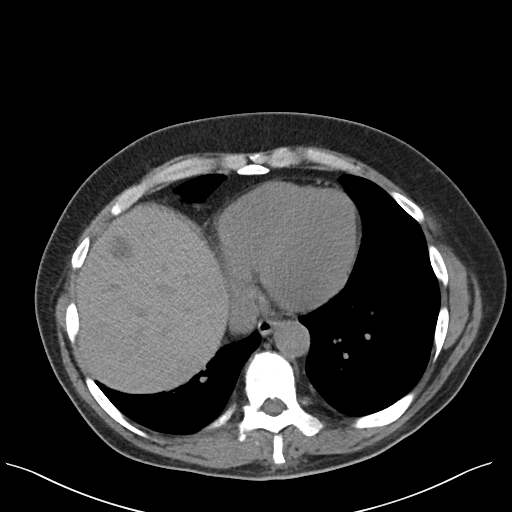
[im 102/107  soft-tissue]
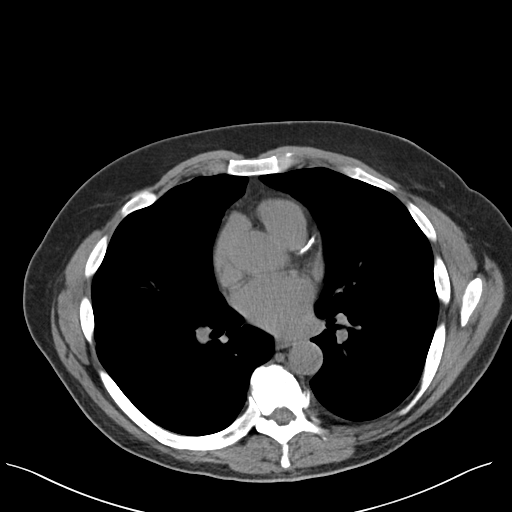

[Series 5: coronal soft tissue · coronal · 0.75mm/px · 3 of 103 slices shown]
[im 35/103  soft-tissue]
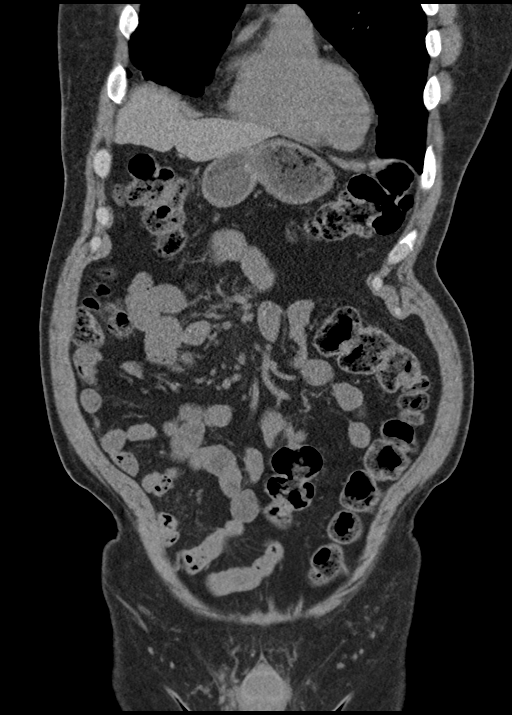
[im 46/103  soft-tissue]
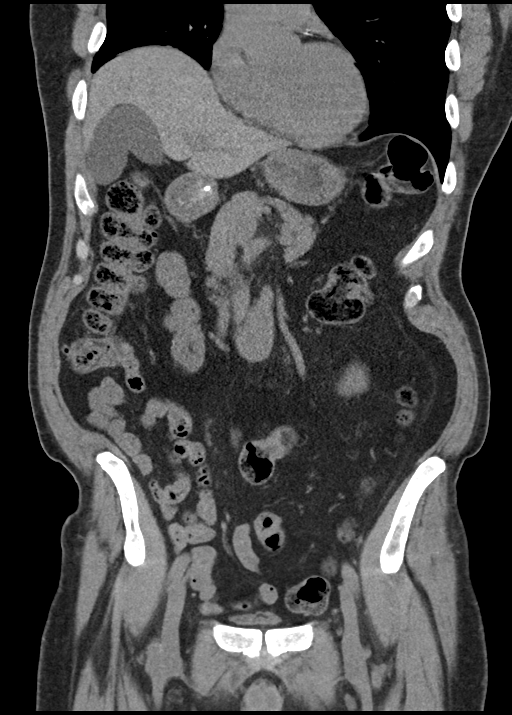
[im 57/103  soft-tissue]
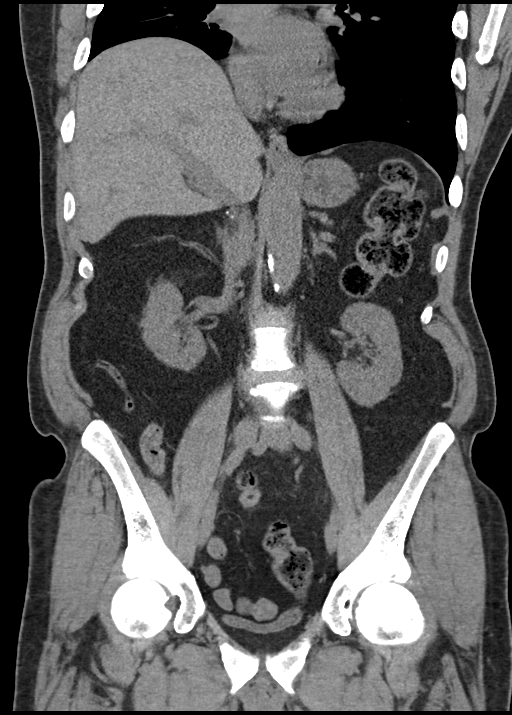

[16 of 46 positions shown; findings below may reference images not displayed]

FINDINGS: Lower chest: No acute abnormality.

Hepatobiliary: No focal liver abnormality is seen. No gallstones,
gallbladder wall thickening, or biliary dilatation.

Pancreas: Unremarkable. No pancreatic ductal dilatation or
surrounding inflammatory changes.

Spleen: Normal in size without focal abnormality.

Adrenals/Urinary Tract: Normal appearance of the adrenal glands.

No kidney stones or hydronephrosis identified bilaterally. No
hydroureter or ureteral lithiasis. Urinary bladder appears
unremarkable.

Stomach/Bowel: Stomach appears normal. The appendix is visualized
and appears within normal limits. No bowel wall thickening,
inflammation, or distension identified.

Vascular/Lymphatic: Aortic atherosclerosis. No enlarged abdominal or
pelvic lymph nodes.

Reproductive: Prostate is unremarkable.

Other: No free fluid or fluid collections identified. Small fat
containing umbilical hernia.

Musculoskeletal: Mild spondylosis within the lower thoracic and
lumbar spine. Anterolisthesis scratch set first degree
anterolisthesis of L4 on L5 is noted. Mild disc space narrowing and
endplate spurring noted at L4-5.
IMPRESSION: 1. No explanation for patient's hematuria. In the setting of
persistent unexplained hematuria recommend further investigation
with hematuria protocol CT of the abdomen pelvis performed without
and with contrast material.
2. Aortic atherosclerosis.

Aortic Atherosclerosis ([id number]).

## 2020-11-17 ENCOUNTER — Other Ambulatory Visit: Payer: Self-pay | Admitting: Family Medicine

## 2020-11-17 DIAGNOSIS — E139 Other specified diabetes mellitus without complications: Secondary | ICD-10-CM

## 2020-12-12 ENCOUNTER — Telehealth: Payer: Self-pay

## 2020-12-12 NOTE — Telephone Encounter (Signed)
Called and spoke with pt, advised pt he can get otc ear wax solution at the pharmacy as well as can use mineral oil. Advised pt can take otc allergy medicine and nasal spray to help with sneezing.

## 2020-12-12 NOTE — Telephone Encounter (Signed)
Pt called wanting to speak to a nurse about what is the best way to clean ear wax and best allergy medication. Please Advise.

## 2021-01-02 ENCOUNTER — Telehealth: Payer: Self-pay

## 2021-01-02 NOTE — Telephone Encounter (Signed)
Patient called in stating that he has had a cold the last two days and wanted a prescription sent in. Advised we will need to schedule an appointment, but patient states he is unavailable today and tomorrow.

## 2021-01-02 NOTE — Telephone Encounter (Signed)
Patient called back and said that he doesn't need anything because he has already had his boosters.

## 2021-01-17 ENCOUNTER — Telehealth: Payer: Self-pay

## 2021-01-17 ENCOUNTER — Encounter (HOSPITAL_COMMUNITY): Payer: Self-pay

## 2021-01-17 ENCOUNTER — Inpatient Hospital Stay (HOSPITAL_COMMUNITY)
Admission: EM | Admit: 2021-01-17 | Discharge: 2021-01-21 | DRG: 389 | Disposition: A | Discharge status: Home / Self Care | Payer: Medicare PPO | Attending: Internal Medicine | Admitting: Internal Medicine

## 2021-01-17 ENCOUNTER — Emergency Department (HOSPITAL_COMMUNITY): Payer: Medicare PPO

## 2021-01-17 DIAGNOSIS — Z0189 Encounter for other specified special examinations: Secondary | ICD-10-CM

## 2021-01-17 DIAGNOSIS — R1110 Vomiting, unspecified: Secondary | ICD-10-CM | POA: Diagnosis not present

## 2021-01-17 DIAGNOSIS — N179 Acute kidney failure, unspecified: Secondary | ICD-10-CM

## 2021-01-17 DIAGNOSIS — R112 Nausea with vomiting, unspecified: Secondary | ICD-10-CM

## 2021-01-17 DIAGNOSIS — N182 Chronic kidney disease, stage 2 (mild): Secondary | ICD-10-CM | POA: Diagnosis present

## 2021-01-17 DIAGNOSIS — T182XXA Foreign body in stomach, initial encounter: Secondary | ICD-10-CM | POA: Diagnosis not present

## 2021-01-17 DIAGNOSIS — I10 Essential (primary) hypertension: Secondary | ICD-10-CM | POA: Diagnosis not present

## 2021-01-17 DIAGNOSIS — I129 Hypertensive chronic kidney disease with stage 1 through stage 4 chronic kidney disease, or unspecified chronic kidney disease: Secondary | ICD-10-CM | POA: Diagnosis present

## 2021-01-17 DIAGNOSIS — Z833 Family history of diabetes mellitus: Secondary | ICD-10-CM

## 2021-01-17 DIAGNOSIS — Z7984 Long term (current) use of oral hypoglycemic drugs: Secondary | ICD-10-CM

## 2021-01-17 DIAGNOSIS — Z20822 Contact with and (suspected) exposure to covid-19: Secondary | ICD-10-CM | POA: Diagnosis present

## 2021-01-17 DIAGNOSIS — R9431 Abnormal electrocardiogram [ECG] [EKG]: Secondary | ICD-10-CM | POA: Diagnosis present

## 2021-01-17 DIAGNOSIS — K5669 Other partial intestinal obstruction: Secondary | ICD-10-CM | POA: Diagnosis not present

## 2021-01-17 DIAGNOSIS — E1122 Type 2 diabetes mellitus with diabetic chronic kidney disease: Secondary | ICD-10-CM | POA: Diagnosis present

## 2021-01-17 DIAGNOSIS — E785 Hyperlipidemia, unspecified: Secondary | ICD-10-CM | POA: Diagnosis present

## 2021-01-17 DIAGNOSIS — Z79899 Other long term (current) drug therapy: Secondary | ICD-10-CM | POA: Diagnosis not present

## 2021-01-17 DIAGNOSIS — Z841 Family history of disorders of kidney and ureter: Secondary | ICD-10-CM

## 2021-01-17 DIAGNOSIS — K56609 Unspecified intestinal obstruction, unspecified as to partial versus complete obstruction: Principal | ICD-10-CM

## 2021-01-17 DIAGNOSIS — T189XXA Foreign body of alimentary tract, part unspecified, initial encounter: Secondary | ICD-10-CM

## 2021-01-17 DIAGNOSIS — R1013 Epigastric pain: Secondary | ICD-10-CM

## 2021-01-17 DIAGNOSIS — R109 Unspecified abdominal pain: Secondary | ICD-10-CM | POA: Diagnosis present

## 2021-01-17 DIAGNOSIS — K56600 Partial intestinal obstruction, unspecified as to cause: Secondary | ICD-10-CM | POA: Diagnosis not present

## 2021-01-17 DIAGNOSIS — D72829 Elevated white blood cell count, unspecified: Secondary | ICD-10-CM | POA: Diagnosis present

## 2021-01-17 DIAGNOSIS — K5939 Other megacolon: Secondary | ICD-10-CM | POA: Diagnosis not present

## 2021-01-17 DIAGNOSIS — Z4682 Encounter for fitting and adjustment of non-vascular catheter: Secondary | ICD-10-CM | POA: Diagnosis not present

## 2021-01-17 DIAGNOSIS — K6389 Other specified diseases of intestine: Secondary | ICD-10-CM | POA: Diagnosis not present

## 2021-01-17 DIAGNOSIS — I878 Other specified disorders of veins: Secondary | ICD-10-CM | POA: Diagnosis not present

## 2021-01-17 DIAGNOSIS — R0602 Shortness of breath: Secondary | ICD-10-CM | POA: Insufficient documentation

## 2021-01-17 DIAGNOSIS — Z8719 Personal history of other diseases of the digestive system: Secondary | ICD-10-CM | POA: Diagnosis present

## 2021-01-17 DIAGNOSIS — E119 Type 2 diabetes mellitus without complications: Secondary | ICD-10-CM

## 2021-01-17 DIAGNOSIS — E1165 Type 2 diabetes mellitus with hyperglycemia: Secondary | ICD-10-CM | POA: Diagnosis present

## 2021-01-17 DIAGNOSIS — Z8249 Family history of ischemic heart disease and other diseases of the circulatory system: Secondary | ICD-10-CM | POA: Diagnosis not present

## 2021-01-17 LAB — COMPREHENSIVE METABOLIC PANEL
ALT: 14 U/L (ref 0–44)
AST: 19 U/L (ref 15–41)
Albumin: 4.2 g/dL (ref 3.5–5.0)
Alkaline Phosphatase: 78 U/L (ref 38–126)
Anion gap: 8 (ref 5–15)
BUN: 23 mg/dL (ref 8–23)
CO2: 29 mmol/L (ref 22–32)
Calcium: 9.6 mg/dL (ref 8.9–10.3)
Chloride: 102 mmol/L (ref 98–111)
Creatinine, Ser: 1.45 mg/dL — ABNORMAL HIGH (ref 0.61–1.24)
GFR, Estimated: 52 mL/min — ABNORMAL LOW (ref >60)
Glucose, Bld: 286 mg/dL — ABNORMAL HIGH (ref 70–99)
Potassium: 4.8 mmol/L (ref 3.5–5.1)
Sodium: 139 mmol/L (ref 135–145)
Total Bilirubin: 0.5 mg/dL (ref 0.3–1.2)
Total Protein: 8.1 g/dL (ref 6.5–8.1)

## 2021-01-17 LAB — CBC
HCT: 38.6 % — ABNORMAL LOW (ref 39.0–52.0)
Hemoglobin: 12.3 g/dL — ABNORMAL LOW (ref 13.0–17.0)
MCH: 31.1 pg (ref 26.0–34.0)
MCHC: 31.9 g/dL (ref 30.0–36.0)
MCV: 97.7 fL (ref 80.0–100.0)
Platelets: 280 10*3/uL (ref 150–400)
RBC: 3.95 MIL/uL — ABNORMAL LOW (ref 4.22–5.81)
RDW: 12.7 % (ref 11.5–15.5)
WBC: 10 10*3/uL (ref 4.0–10.5)
nRBC: 0 % (ref 0.0–0.2)

## 2021-01-17 LAB — URINALYSIS, ROUTINE W REFLEX MICROSCOPIC
Bacteria, UA: NONE SEEN
Bilirubin Urine: NEGATIVE
Glucose, UA: >=500 mg/dL — AB
Hgb urine dipstick: NEGATIVE
Ketones, ur: 5 mg/dL — AB
Leukocytes,Ua: NEGATIVE
Nitrite: NEGATIVE
Protein, ur: NEGATIVE mg/dL
Specific Gravity, Urine: 1.023 (ref 1.005–1.030)
pH: 5 (ref 5.0–8.0)

## 2021-01-17 LAB — LIPASE, BLOOD: Lipase: 29 U/L (ref 11–51)

## 2021-01-17 LAB — CBG MONITORING, ED: Glucose-Capillary: 161 mg/dL — ABNORMAL HIGH (ref 70–99)

## 2021-01-17 MED ORDER — INSULIN ASPART 100 UNIT/ML IJ SOLN
0.0000 [IU] | Freq: Every day | INTRAMUSCULAR | Status: DC
Start: 2021-01-17 — End: 2021-01-19
  Filled 2021-01-17: qty 0.05

## 2021-01-17 MED ORDER — LACTATED RINGERS IV BOLUS
1000.0000 mL | Freq: Once | INTRAVENOUS | Status: AC
  Administered 2021-01-17: 1000 mL via INTRAVENOUS

## 2021-01-17 MED ORDER — ONDANSETRON 4 MG PO TBDP
4.0000 mg | ORAL_TABLET | Freq: Once | ORAL | Status: AC
  Administered 2021-01-17: 4 mg via ORAL
  Filled 2021-01-17: qty 1

## 2021-01-17 MED ORDER — ONDANSETRON HCL 4 MG/2ML IJ SOLN
4.0000 mg | Freq: Four times a day (QID) | INTRAMUSCULAR | Status: DC | PRN
  Administered 2021-01-17 – 2021-01-19 (×8): 4 mg via INTRAVENOUS
  Filled 2021-01-17 (×10): qty 2

## 2021-01-17 MED ORDER — HYDRALAZINE HCL 20 MG/ML IJ SOLN: 10.0000 mg | Freq: Four times a day (QID) | INTRAMUSCULAR | Status: DC | PRN

## 2021-01-17 MED ORDER — INSULIN ASPART 100 UNIT/ML IJ SOLN
0.0000 [IU] | Freq: Three times a day (TID) | INTRAMUSCULAR | Status: DC
  Administered 2021-01-18: 5 [IU] via SUBCUTANEOUS
  Administered 2021-01-18: 2 [IU] via SUBCUTANEOUS
  Administered 2021-01-18: 1 [IU] via SUBCUTANEOUS
  Administered 2021-01-19 (×2): 3 [IU] via SUBCUTANEOUS
  Filled 2021-01-17: qty 0.09

## 2021-01-17 MED ORDER — IOHEXOL 350 MG/ML SOLN
80.0000 mL | Freq: Once | INTRAVENOUS | Status: AC | PRN
  Administered 2021-01-17: 80 mL via INTRAVENOUS

## 2021-01-17 MED ORDER — BARIUM SULFATE 2.1 % PO SUSP: 450.0000 mL | Freq: Once | ORAL | Status: DC

## 2021-01-17 MED ORDER — SODIUM CHLORIDE (PF) 0.9 % IJ SOLN
INTRAMUSCULAR | Status: AC
  Filled 2021-01-17: qty 50

## 2021-01-17 MED ORDER — SODIUM CHLORIDE 0.9 % IV SOLN: INTRAVENOUS | Status: DC

## 2021-01-17 MED ORDER — ENOXAPARIN SODIUM 40 MG/0.4ML IJ SOSY
40.0000 mg | PREFILLED_SYRINGE | INTRAMUSCULAR | Status: DC
  Administered 2021-01-17 – 2021-01-20 (×4): 40 mg via SUBCUTANEOUS
  Filled 2021-01-17 (×4): qty 0.4

## 2021-01-17 MED ORDER — MORPHINE SULFATE (PF) 2 MG/ML IV SOLN
2.0000 mg | INTRAVENOUS | Status: DC | PRN
  Administered 2021-01-17 – 2021-01-19 (×19): 2 mg via INTRAVENOUS
  Filled 2021-01-17 (×21): qty 1

## 2021-01-17 NOTE — ED Notes (Signed)
Pharmacy notified of medication verification

## 2021-01-17 NOTE — ED Triage Notes (Signed)
C/O emesis and epigastric pain that started yesterday. Pt states after he vomited pain would subside and come back later.   7/10 pain  Denies nausea, chest pain, or shob.  Last BM- yesterday-normal   A/ox4 Ambulatory in triage

## 2021-01-17 NOTE — ED Provider Notes (Signed)
Emergency Medicine Provider Triage Evaluation Note  Mark Orvis [facility] Sr. , a 70 y.o. male  was evaluated in triage.  Pt complains of nausea and vomiting that began yesterday.  No changes in diet.  No hematemesis.  No other sick individuals.  Reports epigastric tenderness however it is relieved when he throws up.  Vomiting every 45 minutes.  Review of Systems  Positive: Fever or chills Negative: Diarrhea, constipation  Physical Exam  BP (!) 167/94 (BP Location: Right Arm)   Pulse 74   Temp 97.7 F (36.5 C) (Oral)   Resp 18   SpO2 99%  Gen:   Awake, no distress   Resp:  Normal effort  MSK:   Moves extremities without difficulty  Other:  No tenderness elicitable  Medical Decision Making  Medically screening exam initiated at 10:58 AM.  Appropriate orders placed.  Mark Cunas Sr. was informed that the remainder of the evaluation will be completed by another provider, this initial triage assessment does not replace that evaluation, and the importance of remaining in the ED until their evaluation is complete.     Woodroe Chen 01/17/21 1058    Lorre Nick, MD 01/18/21 [phone]

## 2021-01-17 NOTE — H&P (Signed)
History and Physical    Mark Ali. [id number] DOB: Jan 17, 1951 DOA: 01/17/2021  PCP: [name], MD   Patient coming from: Home    Chief Complaint:   HPI: Mark Ali. is a 70 y.o. male with medical history significant of type 2 diabetes, hypertension, hyperlipidemia who presented with 2-day history of abdominal pain, vomiting, nausea.  .  No report of fever, chills, chest pain, shortness of breath, dysuria, diarrhea or hematochezia.  Patient also reported lack of bowel movement but he was passing gas.  He said he had a bladder surgery done earlier this year. On presentation, his abdominal was not significantly distended or tender.  CT abdomen/pelvis showed SBO due to small bowel bezoar.  General surgery also consulted. Patient seen and examined at the bedside this evening in the emergency department.  Currently he is hemodynamically stable.  Denies any significant pain to me.  But he was vomiting.  Patient was extremely anxious to go home.  Wife at the bedside.  ED Course: He remained mildly hypertensive in the emergency department.  Lab work showed AKI with creatinine of 1.45.  He was mildly hyperglycemic.  CT abdomen/pelvis showed mid small bowel obstruction with associated prominent intraluminal lucencies proximal to the level of transition in the left mid abdomen, suspicious for a possible obstructing bezoar, no evidence of bowel perforation.  General surgery evaluated the patient and did not recommend NG tube right away because his abdominal pain was already improving.  Review of Systems: As per HPI otherwise 10 point review of systems negative.    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 He has never used smokeless tobacco. He reports that he does not drink alcohol and does not use drugs.  No Known Allergies  Family History  Problem Relation Age of Onset   Diabetes Other    Kidney disease Other    Other Mother        respiratory failure died in 45s   Alcohol abuse Father    Lung disease Father        smoking   Heart disease Sister        heart attack 4   Alcohol abuse Brother    Cirrhosis Brother    Kidney disease Sister    COPD Sister        led to death. procedural complication   Diabetes Brother      Prior to Admission medications   Medication Sig Start Date End Date Taking? Authorizing Provider  acetaminophen (TYLENOL) 500 MG tablet Take 1,000 mg by mouth every 6 (six) hours as needed for moderate pain.   Yes [provider]  Ascorbic Acid (VITAMIN C PO) Take 1 tablet by mouth daily.   Yes [provider]  b complex vitamins capsule Take 1 capsule by mouth  daily.   Yes [provider]  CRANBERRY PO Take 1 capsule by mouth daily.   Yes [provider]  Ferrous Sulfate (IRON PO) Take 1 tablet by mouth daily.   Yes [provider]  gabapentin (NEURONTIN) 100 MG capsule TAKE 2 CAPSULES BY MOUTH AT BEDTIME. Patient taking differently: Take 200 mg by mouth at bedtime. 11/17/20  Yes Hulan Saas M, DO  GARLIC PO Take 1 capsule by mouth daily.   Yes [provider]  glipiZIDE (GLUCOTROL) 10 MG tablet TAKE 1 TABLET (10 MG TOTAL) BY MOUTH 2 (TWO) TIMES DAILY BEFORE A MEAL. 11/17/20  Yes [name], MD  hydrochlorothiazide (HYDRODIURIL) 25 MG tablet TAKE 1  TABLET BY MOUTH EVERY DAY Patient taking differently: Take 25 mg by mouth daily. 11/17/20  Yes [name], MD  metFORMIN (GLUCOPHAGE) 1000 MG tablet TAKE 1 TABLET BY MOUTH TWICE DAILY WITH A MEAL Patient taking differently: Take 1,000 mg by mouth 2 (two) times daily with a meal. TAKE 1 TABLET BY MOUTH TWICE DAILY WITH A MEAL 11/17/20  Yes [name], MD  metoprolol succinate (TOPROL-XL) 100 MG 24 hr tablet TAKE 1 TABLET BY MOUTH WITH OR IMMEDIATELY FOLLOWING A MEAL. Patient taking differently: Take 100 mg by mouth daily. 11/17/20  Yes [name], MD  Naphazoline-Pheniramine (VISINE-A OP) Place 1 drop into both eyes daily as needed (dry eyes).   Yes [provider]  simvastatin (ZOCOR) 10 MG tablet TAKE 1 TABLET BY MOUTH EVERY DAY Patient taking differently: Take 10 mg by mouth at bedtime. 08/22/20  Yes [name], MD  TURMERIC PO Take 1 capsule by mouth daily.   Yes [provider]  VITAMIN D PO Take 1 capsule by mouth daily.   Yes [provider]  docusate sodium (COLACE) 100 MG capsule Take 1 capsule (100 mg total) by mouth daily as needed for up to 30 doses. Patient not taking: No sig reported 03/25/20   Janith Lima, MD    Physical Exam: Vitals:   01/17/21 1320 01/17/21 1400 01/17/21 1430 01/17/21 1652  BP:  (!) 155/71 (!) 172/84 (!) 168/78  Pulse: (!) 59 65 63 (!) 59  Resp: 19 16 17  (!) 25  Temp:      TempSrc:      SpO2: 97% 97% 99% 99%    Constitutional: Appears overall comfortable, vomiting Vitals:   01/17/21 1320 01/17/21 1400 01/17/21 1430 01/17/21 1652  BP:  (!) 155/71 (!) 172/84 (!) 168/78  Pulse: (!) 59 65 63 (!) 59  Resp: 19 16 17  (!) 25  Temp:      TempSrc:      SpO2: 97% 97% 99% 99%   Eyes: PERRL, lids and conjunctivae normal ENMT: Mucous membranes are moist.  Neck: normal, supple, no masses, no thyromegaly Respiratory: clear to auscultation bilaterally, no wheezing, no crackles. Normal respiratory effort. No  accessory muscle use.  Cardiovascular: Regular rate and rhythm, no murmurs / rubs / gallops. No extremity edema.  Abdomen: no tenderness, no masses palpated. No hepatosplenomegaly. Bowel sounds heard but slow Musculoskeletal: no clubbing / cyanosis. No joint deformity upper and lower extremities.  Skin: no rashes, lesions, ulcers. No induration Neurologic: CN 2-12 grossly intact.  Strength 5/5 in all 4.  Psychiatric: Normal judgment and insight. Alert and oriented x 3. Normal mood.   Foley Catheter:None  Labs on Admission: I have personally reviewed following labs and imaging studies  CBC: Recent Labs  Lab 01/17/21 1109  WBC 10.0  HGB 12.3*  HCT 38.6*  MCV 97.7  PLT 123456   Basic Metabolic Panel: Recent Labs  Lab 01/17/21 1109  NA 139  K 4.8  CL 102  CO2 29  GLUCOSE 286*  BUN 23  CREATININE 1.45*  CALCIUM 9.6   GFR: CrCl cannot be calculated (Unknown ideal weight.). Liver Function Tests: Recent Labs  Lab 01/17/21 1109  AST 19  ALT 14  ALKPHOS 78  BILITOT 0.5  PROT 8.1  ALBUMIN 4.2   Recent Labs  Lab 01/17/21 1109  LIPASE 29   No results for input(s): AMMONIA in the last 168 hours. Coagulation Profile: No results for input(s): INR, PROTIME in the last 168 hours. Cardiac Enzymes: No results for input(s): CKTOTAL, CKMB, CKMBINDEX, TROPONINI in the last 168 hours. BNP (last 3 results) No results for input(s): PROBNP in the last 8760 hours. HbA1C: No results for input(s): HGBA1C in the last 72 hours. CBG: No results for input(s): GLUCAP in the last 168 hours. Lipid Profile: No results for input(s): CHOL, HDL, LDLCALC, TRIG, CHOLHDL, LDLDIRECT in the last 72 hours. Thyroid Function Tests: No results for input(s): TSH, T4TOTAL, FREET4, T3FREE, THYROIDAB in the last 72 hours. Anemia Panel: No results for input(s): VITAMINB12, FOLATE, FERRITIN, TIBC, IRON, RETICCTPCT in the last 72 hours. Urine analysis:    Component Value Date/Time   COLORURINE YELLOW  01/17/2021 [place] 01/17/2021 1038   LABSPEC 1.023 01/17/2021 1038   PHURINE 5.0 01/17/2021 1038   GLUCOSEU >=500 (A) 01/17/2021 1038   HGBUR NEGATIVE 01/17/2021 1038   HGBUR negative 06/21/2009 1012   BILIRUBINUR NEGATIVE 01/17/2021 1038   BILIRUBINUR n 01/25/2016 1311   KETONESUR 5 (A) 01/17/2021 1038   PROTEINUR NEGATIVE 01/17/2021 1038   UROBILINOGEN 1.0 09/26/2019 1514   NITRITE NEGATIVE 01/17/2021 1038   LEUKOCYTESUR NEGATIVE 01/17/2021 1038    Radiological Exams on Admission: CT ABDOMEN PELVIS W CONTRAST  Result Date: 01/17/2021 CLINICAL DATA:  Abdominal distension with nausea and vomiting since yesterday. EXAM: CT ABDOMEN AND PELVIS WITH CONTRAST TECHNIQUE: Multidetector CT imaging of the abdomen and pelvis was performed using the standard protocol following bolus administration of intravenous contrast. CONTRAST:  14mL OMNIPAQUE IOHEXOL 350 MG/ML SOLN COMPARISON:  Noncontrast abdominopelvic CT 11/20/2019 FINDINGS: Lower chest: Elevated right hemidiaphragm with associated right basilar atelectasis. No significant pleural or pericardial effusion. LAD coronary artery calcification versus stent. Mild aortic atherosclerosis. Hepatobiliary: The liver is normal in density without suspicious focal abnormality. No evidence of gallstones, gallbladder wall thickening or biliary dilatation. Pancreas: Unremarkable. No pancreatic ductal dilatation or surrounding inflammatory changes. Spleen: Normal in size without focal abnormality. Adrenals/Urinary Tract: Both adrenal glands appear normal. The kidneys appear normal without evidence of urinary tract calculus, suspicious lesion or hydronephrosis. No bladder abnormalities are seen. Stomach/Bowel: A small amount of enteric contrast was administered. The stomach and duodenum are not significantly dilated, although there are multiple mildly to moderately dilated loops of jejunum and proximal ileum in the central abdomen with associated  air-levels. The distal small bowel is decompressed. Transition point appears to be in the left mid abdomen where there are multiple intraluminal lucencies within the small bowel (coronal image 37/4 and sagittal image 140/5), suspicious for an obstructing bezoar. Retrocecal appendix appears normal. There is stool throughout the colon which is normal in caliber. No colonic wall thickening or surrounding inflammation. Vascular/Lymphatic: There are no enlarged abdominal or pelvic lymph nodes. Mild aortic and branch vessel atherosclerosis without acute vascular findings. The portal, superior  mesenteric and splenic veins are patent. Reproductive: The prostate gland and seminal vesicles appear normal. Other: Mild interloop fluid between the dilated loops of small bowel in the right anterior mid abdomen. No focal extraluminal fluid collection or free air. No evidence of abdominal wall hernia. Musculoskeletal: No acute or significant osseous findings. Multilevel lumbar spondylosis with a degenerative anterolisthesis at L4-5. IMPRESSION: 1. Mid small bowel obstruction with associated prominent intraluminal lucencies proximal to the level of transition in the left mid abdomen, suspicious for a possible obstructing bezoar. Mild associated interloop fluid without bowel wall thickening or evidence of perforation. 2. The appendix and colon appear normal. 3.  Aortic Atherosclerosis (ICD10-I70.0). Electronically Signed   By: Carey Bullocks M.D.   On: 01/17/2021 17:00     Assessment/Plan Principal Problem:   SBO (small bowel obstruction) ([facility]) Active Problems:   Diabetes mellitus type II, controlled ([facility])   Essential hypertension   AKI (acute kidney injury) ([facility]CC)  SBO: Presented with abdominal pain, nausea and vomiting.  CT abdomen as above.  General surgery consulted. Holding NG tube for now because he reports improvement in the abdominal pain.  Passing gas.  He has slow bowel sounds.  CKD stage II: On reviewing his  previous lab work, his creatinine ranges from 1.5-1.6.  Currently kidney function at baseline.  Continue IV fluids.  Check BMP tomorrow  Diabetes type 2: Takes glipizide, metformin at home.  These medications are on hold for now.  Continue sliding scale insulin.  Monitor blood sugars  Hypertension: Currently hypertensive.  Takes metoprolol, hydrochlorothiazide at home.  Continue as needed medications for severe hypertension  History of hyperlipidemia: On simvastatin at home  Prolonged QT: QTC of 575.  Will repeat EKG tomorrow.  We will be cautious with QT prolonging agents.  Severity of Illness: The appropriate patient status for this patient is OBSERVATION. Observation status is judged to be reasonable and necessary in order to provide the required intensity of service to ensure the patient's safety. The patient's presenting symptoms, physical exam findings, and initial radiographic and laboratory data in the context of their medical condition is felt to place them at decreased risk for further clinical deterioration. Furthermore, it is anticipated that the patient will be medically stable for discharge from the hospital within 2 midnights of admission.    DVT prophylaxis: Lovenox Code Status: Full Family Communication: wife at bedside Consults called: General surgery     Burnadette Pop MD [facility]  01/17/2021, 5:59 PM

## 2021-01-17 NOTE — ED Provider Notes (Signed)
  Physical Exam  BP (!) 168/78   Pulse (!) 59   Temp 97.7 F (36.5 C) (Oral)   Resp (!) 25   SpO2 99%   Physical Exam  ED Course/Procedures     Procedures  MDM  Received patient in signout.  Nausea and vomiting.  Feeling somewhat better.  Had another dose of antiemetics and states he is feeling better after it however has vomited since he drank the contrast for the CAT scan.  CT scan showed bowel obstruction due to small bowel bezoar.  Discussed with Dr. Doylene Canard from general surgery.  Since not actively vomiting right now can hold off on NG tube.  Keep NPO.  Will start more of a small bowel protocol tomorrow if not much improved.  Admit to internal medicine.  No immediate plans for surgery       Benjiman Core, MD 01/17/21 1737

## 2021-01-17 NOTE — Telephone Encounter (Signed)
Patient is currently in ED.  Nurse: Annye English RN, Denise Date/Time (Eastern Time): 01/17/2021 8:37:33 AM Confirm and document reason for call. If symptomatic, describe symptoms. ---Caller states her husband has been throwing since midnight and wants to speak with the nurse before bringing him to the ER. Also holding stomach and has pain.  Does the patient have any new or worsening symptoms? ---Yes Will a triage be completed? ---Yes Related visit to physician within the last 2 weeks? ---No Does the PT have any chronic conditions? (i.e. diabetes, asthma, this includes High risk factors for pregnancy, etc.) --Yes List chronic conditions. ---Diabetes Is this a behavioral health or substance abuse call? ---No  Vomiting [1] SEVERE vomiting (e.g., 6 or more times/day)patient sounds well, is drinking liquids,does not sound dehydrated, and vomiting has lasted less than 24 hours) [2] present > 8 hours (Exception: Carmon, RN, Denise 01/17/2021 8:38:29 AM  01/17/2021 8:41:22 AM Go to ED Now (or PCP triage) Yes Carmon, RN, Denise GO TO ED NOW (OR PCP TRIAGE): BRING A BUCKET IN CASE OF VOMITING: * You may wish to bring a bucket, pan, or plastic bag with you in case there is more vomiting during the drive. BRING MEDICINES: * Please bring a list of your current medicines when you go to see the doctor. CARE ADVICE per Vomiting (Adult) guideline.

## 2021-01-17 NOTE — ED Provider Notes (Signed)
[facility] Note   CSN: [id number] Arrival date & time: 01/17/21  1016     History Chief Complaint  Patient presents with   Abdominal Pain    Mark Flury Sr. is a 70 y.o. male with a past medical history significant for type 2 diabetes, high blood pressure, hyperlipidemia who presents with 2 days of stomach pain, vomiting.  Patient reports that he would have a rise in abdominal pain, feel that he needed to vomit, and then feel relief of the abdominal pain after vomiting.  Patient reports that he did not feel significantly nauseous, just felt that he needed to vomit.  No chest pain, no shortness of breath.  No history of ACS.  Patient denies significant alcohol use, history of gallstones.  Patient denies history of intra-abdominal surgery.  Patient denies dysuria, hematuria.  Patient does report that he did not have a bowel movement today, which she normally has once a day, however he has been passing gas.  Patient denies blood in his emesis, or any recent bowel movements.  Patient notably does not check his blood sugar at home, he does report compliance with his metformin, glipizide however he does not know what his blood sugar normally runs.   Abdominal Pain     Past Medical History:  Diagnosis Date   Diabetes type 2, controlled (HCC)    Hyperlipidemia    Hypertension    UNSPECIFIED TACHYCARDIA 05/06/2007   Listed 05/06/2007- do not see recurrent tachycardia since that time     Patient Active Problem List   Diagnosis Date Noted   Bilateral primary osteoarthritis of knee 04/06/2020   Right-sided low back pain with right-sided sciatica 01/01/2020   Bilateral knee pain 01/01/2020   Aortic atherosclerosis (HCC) 12/11/2019   Hyperlipidemia associated with type 2 diabetes mellitus (HCC) 02/21/2017   Erectile dysfunction 09/01/2014   Anemia, chronic disease 07/14/2009   Diabetes mellitus type II, controlled (HCC) 11/25/2006    Essential hypertension 11/25/2006    Past Surgical History:  Procedure Laterality Date   CYSTOSCOPY WITH BIOPSY N/A 03/25/2020   Procedure: CYSTOSCOPY WITH PROSTATE UERTHRAL  BIOPSY/ TRANSURETHRAL RESECTION OF PROSTATE/ BLADDER BIOPSY/ BILATERAL RETROGRADE/ URETEROSCOPY;  Surgeon: Jannifer Hick, MD;  Location: [facility];  Service: Urology;  Laterality: N/A;   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 procedural complication   Diabetes Brother     Social History   Tobacco Use   Smoking status: Never   Smokeless tobacco: Never  Vaping Use   Vaping Use: Never used  Substance Use Topics   Alcohol use: No   Drug use: No    Home Medications Prior to Admission medications   Medication Sig Start Date End Date Taking? Authorizing Provider  acetaminophen (TYLENOL) 500 MG tablet Take 1,000 mg by mouth  every 6 (six) hours as needed for moderate pain.    [provider]  Ascorbic Acid (VITAMIN C PO) Take 1 tablet by mouth daily.    [provider]  b complex vitamins capsule Take 1 capsule by mouth daily.    [provider]  CRANBERRY PO Take 1 capsule by mouth daily.    [provider]  docusate sodium (COLACE) 100 MG capsule Take 1 capsule (100 mg total) by mouth daily as needed for up to 30 doses. Patient not taking: Reported on 10/14/2020 03/25/20   Janith Lima, MD  Ferrous Sulfate (IRON PO) Take 1 tablet by mouth daily.    [provider]  gabapentin (NEURONTIN) 100 MG capsule TAKE 2 CAPSULES BY MOUTH AT BEDTIME. 11/17/20   Lyndal Pulley, DO   GARLIC PO Take 1 capsule by mouth daily.    [provider]  glipiZIDE (GLUCOTROL) 10 MG tablet TAKE 1 TABLET (10 MG TOTAL) BY MOUTH 2 (TWO) TIMES DAILY BEFORE A MEAL. 11/17/20   [name], MD  hydrochlorothiazide (HYDRODIURIL) 25 MG tablet TAKE 1 TABLET BY MOUTH EVERY DAY 11/17/20   [name], MD  metFORMIN (GLUCOPHAGE) 1000 MG tablet TAKE 1 TABLET BY MOUTH TWICE DAILY WITH A MEAL 11/17/20   [name], MD  metoprolol succinate (TOPROL-XL) 100 MG 24 hr tablet TAKE 1 TABLET BY MOUTH WITH OR IMMEDIATELY FOLLOWING A MEAL. 11/17/20   [name], MD  Saw Palmetto, Serenoa repens, (SAW PALMETTO PO) Take 1 tablet by mouth daily.    [provider]  sildenafil (REVATIO) 20 MG tablet Take 20-100 mg by mouth daily as needed (erectile dysfunction).    [provider]  sildenafil (VIAGRA) 50 MG tablet Take 50-100 mg by mouth daily as needed for erectile dysfunction.    [provider]  simvastatin (ZOCOR) 10 MG tablet TAKE 1 TABLET BY MOUTH EVERY DAY 08/22/20   [name], MD  TURMERIC PO Take 1 capsule by mouth daily.    [provider]  VITAMIN D PO Take 1 capsule by mouth daily.    [provider]    Allergies    Patient has no known allergies.  Review of Systems   Review of Systems  Gastrointestinal:  Positive for abdominal pain.  All other systems reviewed and are negative.  Physical Exam Updated Vital Signs BP (!) 172/84   Pulse 63   Temp 97.7 F (36.5 C) (Oral)   Resp 17   SpO2 99%   Physical Exam Vitals and nursing note reviewed.  Constitutional:      General: He is not in acute distress.    Appearance: Normal appearance.  HENT:     Head: Normocephalic and atraumatic.  Eyes:     General:        Right eye: No discharge.        Left eye: No discharge.  Cardiovascular:     Rate and Rhythm: Normal rate and regular rhythm.     Heart sounds: No murmur heard.   No friction rub. No gallop.   Pulmonary:     Effort: Pulmonary effort is normal.     Breath sounds: Normal breath sounds.  Abdominal:     General: Bowel sounds are normal.     Palpations: Abdomen is soft.     Comments: No tenderness to palpation throughout entire abdomen, no rebound, rigidity, guarding.  No ecchymosis or other abnormality on the skin surface of the abdomen.  No CVA tenderness.  No suprapubic tenderness.  Skin:    General: Skin is warm and dry.     Capillary Refill: Capillary refill takes less than 2 seconds.  Neurological:     Mental Status: He is alert and oriented to person, place, and time.  Psychiatric:        Mood and Affect: Mood normal.        Behavior: Behavior normal.    ED Results / Procedures / Treatments   Labs (all labs ordered are listed, but only abnormal results are displayed) Labs Reviewed  COMPREHENSIVE METABOLIC PANEL - Abnormal; Notable for the following components:      Result Value   Glucose, Bld 286 (*)    Creatinine, Ser 1.45 (*)    GFR, Estimated 52 (*)    All other components within normal limits  CBC - Abnormal; Notable for the following components:   RBC 3.95 (*)    Hemoglobin 12.3 (*)    HCT 38.6 (*)    All other components within normal limits  URINALYSIS, ROUTINE W REFLEX MICROSCOPIC - Abnormal; Notable for the following components:   Glucose, UA >=500 (*)    Ketones, ur 5 (*)    All other components within normal limits  LIPASE, BLOOD    EKG EKG Interpretation  Date/Time:  Tuesday January 17 2021 14:30:04 EST Ventricular Rate:  64 PR Interval:  160 QRS Duration: 99 QT Interval:  557 QTC Calculation: 575 R Axis:   -18 Text Interpretation: Sinus rhythm Borderline left axis deviation Nonspecific T abnormalities, lateral leads Prolonged QT interval No acute changes Prolonged QT Confirmed by Varney Biles [id number]) on 01/17/2021 2:46:28 PM  Radiology No results found.  Procedures Procedures   Medications Ordered in ED Medications  Barium  Sulfate 2.1 % SUSP 450 mL (has no administration in time range)  ondansetron (ZOFRAN-ODT) disintegrating tablet 4 mg (4 mg Oral Given 01/17/21 1307)  lactated ringers bolus 1,000 mL (1,000 mLs Intravenous New Bag/Given 01/17/21 1509)  ondansetron (ZOFRAN-ODT) disintegrating tablet 4 mg (4 mg Oral Given 01/17/21 1514)    ED Course  I have reviewed the triage vital signs and the nursing notes.  Pertinent labs & imaging results that were available during my care of the patient were reviewed by me and considered in my medical decision making (see chart for details).    MDM Rules/Calculators/A&P                         I discussed this case with my attending physician who cosigned this note including patient's presenting symptoms, physical exam, and planned diagnostics and interventions. Attending physician stated agreement with plan or made changes to plan which were implemented.   Attending physician assessed patient at bedside.  Overall well-appearing male with no history of intra-abdominal surgery, no evidence of any emesis, no evidence of bloody diarrhea.  Overall benign abdomen on exam.  However as patient has repeating remitting cycle of nausea, rising abdominal pain, followed by bilious vomiting some concern for additional abdominal process unrelated to elevated blood sugar, gastroparesis, gastroenteritis is suspected.  After discussion with Dr. Kathrynn Humble, and some shared decision making with patient patient does wish for CT of the abdomen at this time to rule out acute or surgical intra-abdominal abnormality.  Prolonged QT on EKG.  Will hesitate to continue to administer antinausea medication.  At this time CT abdomen pelvis with contrast, plus or minus oral contrast is pending.  Also will begin  LR bolus.  3:51 PM [facility] of [place]. transferred to Dr. Alvino Chapel at the end of my shift as the patient will require reassessment once labs/imaging have resulted. Patient  presentation, ED course, and plan of care discussed with review of all pertinent labs and imaging. Please see his/her note for further details regarding further ED course and disposition. Plan at time of handoff is d/c pending CT abdomen/pelvis results. Repeat EKG needed due to prolonged QT. This may be altered or completely changed at the discretion of the oncoming team pending results of further workup.  Final Clinical Impression(s) / ED Diagnoses Final diagnoses:  Epigastric pain  Nausea and vomiting, unspecified vomiting type    Rx / DC Orders ED Discharge Orders     None        Anselmo Pickler, PA-C 01/17/21 1552    Varney Biles, MD 01/18/21 [phone]

## 2021-01-18 ENCOUNTER — Observation Stay (HOSPITAL_COMMUNITY): Payer: Medicare PPO

## 2021-01-18 ENCOUNTER — Encounter (HOSPITAL_COMMUNITY): Payer: Self-pay | Admitting: Internal Medicine

## 2021-01-18 ENCOUNTER — Inpatient Hospital Stay (HOSPITAL_COMMUNITY): Payer: Medicare PPO

## 2021-01-18 DIAGNOSIS — N182 Chronic kidney disease, stage 2 (mild): Secondary | ICD-10-CM | POA: Diagnosis present

## 2021-01-18 DIAGNOSIS — K56609 Unspecified intestinal obstruction, unspecified as to partial versus complete obstruction: Secondary | ICD-10-CM | POA: Diagnosis not present

## 2021-01-18 DIAGNOSIS — E1165 Type 2 diabetes mellitus with hyperglycemia: Secondary | ICD-10-CM | POA: Diagnosis present

## 2021-01-18 DIAGNOSIS — K6389 Other specified diseases of intestine: Secondary | ICD-10-CM | POA: Diagnosis not present

## 2021-01-18 DIAGNOSIS — N179 Acute kidney failure, unspecified: Secondary | ICD-10-CM | POA: Diagnosis present

## 2021-01-18 DIAGNOSIS — Z20822 Contact with and (suspected) exposure to covid-19: Secondary | ICD-10-CM | POA: Diagnosis present

## 2021-01-18 DIAGNOSIS — I129 Hypertensive chronic kidney disease with stage 1 through stage 4 chronic kidney disease, or unspecified chronic kidney disease: Secondary | ICD-10-CM | POA: Diagnosis present

## 2021-01-18 DIAGNOSIS — E785 Hyperlipidemia, unspecified: Secondary | ICD-10-CM | POA: Diagnosis present

## 2021-01-18 DIAGNOSIS — Z8719 Personal history of other diseases of the digestive system: Secondary | ICD-10-CM | POA: Diagnosis present

## 2021-01-18 DIAGNOSIS — Z841 Family history of disorders of kidney and ureter: Secondary | ICD-10-CM | POA: Diagnosis not present

## 2021-01-18 DIAGNOSIS — Z8249 Family history of ischemic heart disease and other diseases of the circulatory system: Secondary | ICD-10-CM | POA: Diagnosis not present

## 2021-01-18 DIAGNOSIS — Z7984 Long term (current) use of oral hypoglycemic drugs: Secondary | ICD-10-CM | POA: Diagnosis not present

## 2021-01-18 DIAGNOSIS — R9431 Abnormal electrocardiogram [ECG] [EKG]: Secondary | ICD-10-CM | POA: Diagnosis present

## 2021-01-18 DIAGNOSIS — R109 Unspecified abdominal pain: Secondary | ICD-10-CM | POA: Diagnosis present

## 2021-01-18 DIAGNOSIS — E1122 Type 2 diabetes mellitus with diabetic chronic kidney disease: Secondary | ICD-10-CM | POA: Diagnosis present

## 2021-01-18 DIAGNOSIS — Z833 Family history of diabetes mellitus: Secondary | ICD-10-CM | POA: Diagnosis not present

## 2021-01-18 DIAGNOSIS — Z79899 Other long term (current) drug therapy: Secondary | ICD-10-CM | POA: Diagnosis not present

## 2021-01-18 DIAGNOSIS — D72829 Elevated white blood cell count, unspecified: Secondary | ICD-10-CM | POA: Diagnosis present

## 2021-01-18 LAB — CBC
HCT: 36.8 % — ABNORMAL LOW (ref 39.0–52.0)
Hemoglobin: 11.8 g/dL — ABNORMAL LOW (ref 13.0–17.0)
MCH: 31.1 pg (ref 26.0–34.0)
MCHC: 32.1 g/dL (ref 30.0–36.0)
MCV: 97.1 fL (ref 80.0–100.0)
Platelets: 268 10*3/uL (ref 150–400)
RBC: 3.79 MIL/uL — ABNORMAL LOW (ref 4.22–5.81)
RDW: 12.9 % (ref 11.5–15.5)
WBC: 13.4 10*3/uL — ABNORMAL HIGH (ref 4.0–10.5)
nRBC: 0 % (ref 0.0–0.2)

## 2021-01-18 LAB — BASIC METABOLIC PANEL
Anion gap: 7 (ref 5–15)
BUN: 26 mg/dL — ABNORMAL HIGH (ref 8–23)
CO2: 29 mmol/L (ref 22–32)
Calcium: 9.4 mg/dL (ref 8.9–10.3)
Chloride: 103 mmol/L (ref 98–111)
Creatinine, Ser: 1.51 mg/dL — ABNORMAL HIGH (ref 0.61–1.24)
GFR, Estimated: 49 mL/min — ABNORMAL LOW (ref >60)
Glucose, Bld: 206 mg/dL — ABNORMAL HIGH (ref 70–99)
Potassium: 4.3 mmol/L (ref 3.5–5.1)
Sodium: 139 mmol/L (ref 135–145)

## 2021-01-18 LAB — RESP PANEL BY RT-PCR (FLU A&B, COVID) ARPGX2
Influenza A by PCR: NEGATIVE
Influenza B by PCR: NEGATIVE
SARS Coronavirus 2 by RT PCR: NEGATIVE

## 2021-01-18 LAB — CBG MONITORING, ED
Glucose-Capillary: 186 mg/dL — ABNORMAL HIGH (ref 70–99)
Glucose-Capillary: 143 mg/dL — ABNORMAL HIGH (ref 70–99)

## 2021-01-18 LAB — GLUCOSE, CAPILLARY
Glucose-Capillary: 168 mg/dL — ABNORMAL HIGH (ref 70–99)
Glucose-Capillary: 179 mg/dL — ABNORMAL HIGH (ref 70–99)

## 2021-01-18 MED ORDER — DIATRIZOATE MEGLUMINE & SODIUM 66-10 % PO SOLN
90.0000 mL | Freq: Once | ORAL | Status: AC
  Administered 2021-01-18: 90 mL via ORAL
  Filled 2021-01-18 (×2): qty 90

## 2021-01-18 MED ORDER — PROCHLORPERAZINE EDISYLATE 10 MG/2ML IJ SOLN
10.0000 mg | Freq: Four times a day (QID) | INTRAMUSCULAR | Status: DC | PRN
  Administered 2021-01-18 – 2021-01-20 (×5): 10 mg via INTRAVENOUS
  Filled 2021-01-18 (×6): qty 2

## 2021-01-18 NOTE — Progress Notes (Signed)
Patient ID: Mark Cunas Sr., male   DOB: [birth year], 70 y.o.   MRN: [id number]  Followed up with the patient this afternoon. He has had a couple of episodes of vomiting since he has been here (last one 1330), but feels it is less frequent than when he was at home. He denies abdominal pain. He reports flatus. Denies a BM since yesterday. We discussed NG tube placement but he wants to attempt to drink the gastrografin. We discussed the benefit of NG decompression for helping his bowel obstruction resolve non-operatively. Patient stated he would likely allow NG tube placement if he vomits up the oral gastrografin. I discussed the pathophysiology of small bowel obstructions with the patient and his wife and answered their questions. [facility] will continue to follow closely.    Exam: Pt alert and nontoxic appearing Pulm: normal effort on room air  Abd: soft, non-tender, non-distended, no surgical scars, no hernias    Hosie Spangle, [facility] Please see Amion for pager number during day hours 7:00am-4:30pm

## 2021-01-18 NOTE — Progress Notes (Signed)
PROGRESS NOTE    Mark Mangino Sr.  JKK:[id number] DOB: 1950-03-08 DOA: 01/17/2021 PCP: Shelva Majestic, MD   Chief Complain:And pain,nausea and vomiting  Brief Narrative:   Mark Cunas Sr. is a 70 y.o. male with medical history significant of type 2 diabetes, hypertension, hyperlipidemia who presented with 2-day history of abdominal pain, vomiting, nausea. On presentation, his abdominal was not significantly distended or tender.  CT abdomen/pelvis showed SBO due to small bowel bezoar.  General surgery also consulted and following. Currently on conservative management without NG tube.   Assessment & Plan:   Principal Problem:   SBO (small bowel obstruction) (HCC) Active Problems:   Diabetes mellitus type II, controlled (HCC)   Essential hypertension   AKI (acute kidney injury) (HCC)   SBO: Presented with abdominal pain, nausea and vomiting.  CT abdomen as above.  General surgery consulted. Holding NG tube for now because he reports improvement in the abdominal pain.  Passing gas.  He has slow bowel sounds.nausea better today   CKD stage II: On reviewing his previous lab work, his creatinine ranges from 1.5-1.6.  Currently kidney function at baseline.  Continue IV fluids.  Check BMP tomorrow   Diabetes type 2: Takes glipizide, metformin at home.  These medications are on hold for now.  Continue sliding scale insulin.  Monitor blood sugars   Hypertension: Currently hypertensive.  Takes metoprolol, hydrochlorothiazide at home.  Continue as needed medications for severe hypertension   History of hyperlipidemia: On simvastatin at home   Prolonged QT: QTC of 575.  Will repeat EKG today.  We will be cautious with QT prolonging agents.  Leukocytosis: Most likely reactive.  Continue to monitor.         DVT prophylaxis: Lovenox Code Status: Full code Family Communication: Wife at bedside Status is: Observation    Consultants:  Surgery  Procedures:None  Antimicrobials:  Anti-infectives (From admission, onward)    None       Subjective:  Patient seen and examined at the bedside this morning in the emergency department.  He feels better than yesterday.  He is still nauseous but vomiting has improved since yesterday.  Does not report worsening of the abdominal movement.  Passing gas.  No bowel movement.   Objective: Vitals:   01/18/21 0600 01/18/21 0730 01/18/21 0830 01/18/21 1100  BP: 137/63 (!) 145/81 (!) 155/85 139/68  Pulse: 65 64 65 66  Resp: 16 20 (!) 21 (!) 21  Temp:      TempSrc:      SpO2: 96% 97% 95% 96%    Intake/Output Summary (Last 24 hours) at 01/18/2021 1258 Last data filed at 01/17/2021 1610 Gross per 24 hour  Intake 1000 ml  Output --  Net 1000 ml   There were no vitals filed for this visit.  Examination:  General exam: Overall comfortable, not in distress HEENT: PERRL Respiratory system:  no wheezes or crackles  Cardiovascular system: S1 & S2 heard, RRR.  Gastrointestinal system: Abdomen is nondistended, soft and nontender.Slow bowel sounds Central nervous system: Alert and oriented Extremities: No edema, no clubbing ,no cyanosis Skin: No rashes, no ulcers,no icterus     Data Reviewed: I have personally reviewed following labs and imaging studies  CBC: Recent Labs  Lab 01/17/21 1109 01/18/21 0354  WBC 10.0 13.4*  HGB 12.3* 11.8*  HCT 38.6* 36.8*  MCV 97.7 97.1  PLT 280 268   Basic Metabolic Panel: Recent Labs  Lab 01/17/21 1109 01/18/21 0354  NA 139  139  K 4.8 4.3  CL 102 103  CO2 29 29  GLUCOSE 286* 206*  BUN 23 26*  CREATININE 1.45* 1.51*  CALCIUM 9.6 9.4   GFR: CrCl cannot be calculated (Unknown ideal weight.). Liver Function Tests: Recent Labs  Lab 01/17/21 1109  AST 19  ALT 14  ALKPHOS 78  BILITOT 0.5  PROT 8.1  ALBUMIN 4.2   Recent Labs  Lab 01/17/21 1109  LIPASE 29   No results for input(s): AMMONIA in the last 168  hours. Coagulation Profile: No results for input(s): INR, PROTIME in the last 168 hours. Cardiac Enzymes: No results for input(s): CKTOTAL, CKMB, CKMBINDEX, TROPONINI in the last 168 hours. BNP (last 3 results) No results for input(s): PROBNP in the last 8760 hours. HbA1C: No results for input(s): HGBA1C in the last 72 hours. CBG: Recent Labs  Lab 01/17/21 2211 01/18/21 0740 01/18/21 1218  GLUCAP 161* 186* 143*   Lipid Profile: No results for input(s): CHOL, HDL, LDLCALC, TRIG, CHOLHDL, LDLDIRECT in the last 72 hours. Thyroid Function Tests: No results for input(s): TSH, T4TOTAL, FREET4, T3FREE, THYROIDAB in the last 72 hours. Anemia Panel: No results for input(s): VITAMINB12, FOLATE, FERRITIN, TIBC, IRON, RETICCTPCT in the last 72 hours. Sepsis Labs: No results for input(s): PROCALCITON, LATICACIDVEN in the last 168 hours.  Recent Results (from the past 240 hour(s))  Resp Panel by RT-PCR (Flu A&B, Covid) Nasopharyngeal Swab     Status: None   Collection Time: 01/18/21  3:56 AM   Specimen: Nasopharyngeal Swab; Nasopharyngeal(NP) swabs in vial transport medium  Result Value Ref Range Status   SARS Coronavirus 2 by RT PCR NEGATIVE NEGATIVE Final    Comment: (NOTE) SARS-CoV-2 target nucleic acids are NOT DETECTED.  The SARS-CoV-2 RNA is generally detectable in upper respiratory specimens during the acute phase of infection. The lowest concentration of SARS-CoV-2 viral copies this assay can detect is 138 copies/mL. A negative result does not preclude SARS-Cov-2 infection and should not be used as the sole basis for treatment or other patient management decisions. A negative result may occur with  improper specimen collection/handling, submission of specimen other than nasopharyngeal swab, presence of viral mutation(s) within the areas targeted by this assay, and inadequate number of viral copies(<138 copies/mL). A negative result must be combined with clinical observations,  patient history, and epidemiological information. The expected result is Negative.  Fact Sheet for Patients:  EntrepreneurPulse.com.au  Fact Sheet for Healthcare Providers:  IncredibleEmployment.be  This test is no t yet approved or cleared by the [place] FDA and  has been authorized for detection and/or diagnosis of SARS-CoV-2 by FDA under an Emergency Use Authorization (EUA). This EUA will remain  in effect (meaning this test can be used) for the duration of the COVID-19 declaration under Section 564(b)(1) of the Act, 21 U.S.C.section 360bbb-3(b)(1), unless the authorization is terminated  or revoked sooner.       Influenza A by PCR NEGATIVE NEGATIVE Final   Influenza B by PCR NEGATIVE NEGATIVE Final    Comment: (NOTE) The Xpert Xpress SARS-CoV-2/FLU/RSV plus assay is intended as an aid in the diagnosis of influenza from Nasopharyngeal swab specimens and should not be used as a sole basis for treatment. Nasal washings and aspirates are unacceptable for Xpert Xpress SARS-CoV-2/FLU/RSV testing.  Fact Sheet for Patients: EntrepreneurPulse.com.au  Fact Sheet for Healthcare Providers: IncredibleEmployment.be  This test is not yet approved or cleared by the [place] FDA and has been authorized for detection and/or diagnosis of SARS-CoV-2  by FDA under an Emergency Use Authorization (EUA). This EUA will remain in effect (meaning this test can be used) for the duration of the COVID-19 declaration under Section 564(b)(1) of the Act, 21 U.S.C. section 360bbb-3(b)(1), unless the authorization is terminated or revoked.  Performed at [facility], [place] [address] [place], [place] [address]          Radiology Studies: CT ABDOMEN PELVIS W CONTRAST  Result Date: 01/17/2021 CLINICAL DATA:  Abdominal distension with nausea and vomiting since yesterday. EXAM: CT ABDOMEN AND PELVIS  WITH CONTRAST TECHNIQUE: Multidetector CT imaging of the abdomen and pelvis was performed using the standard protocol following bolus administration of intravenous contrast. CONTRAST:  68mL OMNIPAQUE IOHEXOL 350 MG/ML SOLN COMPARISON:  Noncontrast abdominopelvic CT 11/20/2019 FINDINGS: Lower chest: Elevated right hemidiaphragm with associated right basilar atelectasis. No significant pleural or pericardial effusion. LAD coronary artery calcification versus stent. Mild aortic atherosclerosis. Hepatobiliary: The liver is normal in density without suspicious focal abnormality. No evidence of gallstones, gallbladder wall thickening or biliary dilatation. Pancreas: Unremarkable. No pancreatic ductal dilatation or surrounding inflammatory changes. Spleen: Normal in size without focal abnormality. Adrenals/Urinary Tract: Both adrenal glands appear normal. The kidneys appear normal without evidence of urinary tract calculus, suspicious lesion or hydronephrosis. No bladder abnormalities are seen. Stomach/Bowel: A small amount of enteric contrast was administered. The stomach and duodenum are not significantly dilated, although there are multiple mildly to moderately dilated loops of jejunum and proximal ileum in the central abdomen with associated air-levels. The distal small bowel is decompressed. Transition point appears to be in the left mid abdomen where there are multiple intraluminal lucencies within the small bowel (coronal image 37/4 and sagittal image 140/5), suspicious for an obstructing bezoar. Retrocecal appendix appears normal. There is stool throughout the colon which is normal in caliber. No colonic wall thickening or surrounding inflammation. Vascular/Lymphatic: There are no enlarged abdominal or pelvic lymph nodes. Mild aortic and branch vessel atherosclerosis without acute vascular findings. The portal, superior mesenteric and splenic veins are patent. Reproductive: The prostate gland and seminal vesicles  appear normal. Other: Mild interloop fluid between the dilated loops of small bowel in the right anterior mid abdomen. No focal extraluminal fluid collection or free air. No evidence of abdominal wall hernia. Musculoskeletal: No acute or significant osseous findings. Multilevel lumbar spondylosis with a degenerative anterolisthesis at L4-5. IMPRESSION: 1. Mid small bowel obstruction with associated prominent intraluminal lucencies proximal to the level of transition in the left mid abdomen, suspicious for a possible obstructing bezoar. Mild associated interloop fluid without bowel wall thickening or evidence of perforation. 2. The appendix and colon appear normal. 3.  Aortic Atherosclerosis (ICD10-I70.0). Electronically Signed   By: Richardean Sale M.D.   On: 01/17/2021 17:00   DG Abd Portable 1V-Small Bowel Obstruction Protocol-initial, 8 hr delay  Result Date: 01/18/2021 CLINICAL DATA:  70 year old male with small bowel obstruction by CT yesterday. EXAM: PORTABLE ABDOMEN - 1 VIEW COMPARISON:  CT Abdomen and Pelvis 01/17/2021. FINDINGS: AP views at 0405 hours. Bowel-gas pattern appears mildly improved since yesterday, although the dilated small bowel loops were largely occult on the CT scout view. Other abdominal and pelvic visceral contours appear normal; partially visible excreted IV contrast in the urinary bladder. No acute osseous abnormality identified. Negative visible left lung base. IMPRESSION: Bowel-gas pattern appears improved since yesterday, although dilated small bowel loops were largely occult on the CT scout view. Electronically Signed   By: Genevie Ann M.D.   On: 01/18/2021  04:23        Scheduled Meds:  Barium Sulfate  450 mL Oral Once   diatrizoate meglumine-sodium  90 mL Oral Once   enoxaparin (LOVENOX) injection  40 mg Subcutaneous Q24H   insulin aspart  0-5 Units Subcutaneous QHS   insulin aspart  0-9 Units Subcutaneous TID WC   Continuous Infusions:  sodium chloride 100 mL/hr at  01/18/21 0508     LOS: 0 days    Time spent: 25 mins.More than 50% of that time was spent in counseling and/or coordination of care.      Shelly Coss, MD [facility] P11/16/2022, 12:58 PM

## 2021-01-18 NOTE — ED Notes (Signed)
Pt is not open to NG tube placement at this time.  Explained procedure, benefits of having NG tube in place however pt would like to hold off. Lanora Manis, [place] notified via secure chat.

## 2021-01-18 NOTE — Consult Note (Signed)
Surgical Evaluation Requesting provider: Dr. Alvino Chapel   Chief Complaint: abdominal pain, vomiting  HPI: Pleasant 70 year old gentleman with history of diabetes, hypertension, hyperlipidemia, CKD who presented to the [facility] emergency room yesterday at 10:15 AM with a 2-day history of colicky abdominal pain and vomiting.  Pain would worsen, subsequently he would have some relief of the pain after vomiting.  Emesis is bilious but nonbloody.  No previous abdominal surgery.  Denies any recent diet changes or any recollection of eating anything particularly fibrous.  He has continued to pass flatus but has not had a bowel movement since before the symptoms began. He did undergo CT of the abdomen, but vomited much of the p.o. contrast he was administered.  This morning he is feeling somewhat better.  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 procedural complication   Diabetes Brother     Social History   Socioeconomic History   Marital  status: Married    Spouse name: Not on file   Number of children: Not on file   Years of education: Not on file   Highest education level: Not on file  Occupational History   Not on file  Tobacco Use   Smoking status: Never   Smokeless tobacco: Never  Vaping Use   Vaping Use: Never used  Substance and Sexual Activity   Alcohol use: No   Drug use: No   Sexual activity: Yes  Other Topics Concern   Not on file  Social History Narrative   Married 3 children. Wife and son patient here. 2 grandkids.       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    [employer] undergrad   1978 [employer]- 4 degrees through 2000- [employer], math. 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    Social Determinants of Health   Financial Resource Strain:  Low Risk    Difficulty of Paying Living Expenses: Not hard at all  Food Insecurity: No Food Insecurity   Worried About Running Out of Food in the Last Year: Never true   Ran Out of Food in the Last Year: Never true  Transportation Needs: No Transportation Needs   Lack of Transportation (Medical): No   Lack of Transportation (Non-Medical): No  Physical Activity: Sufficiently Active   Days of Exercise per Week: 5 days   Minutes of Exercise per Session: 50 min  Stress: No Stress Concern Present   Feeling of Stress : Not at all  Social Connections: Socially Integrated   Frequency of Communication with Friends and Family: More than three times a week   Frequency of Social Gatherings with Friends  and Family: More than three times a week   Attends Religious Services: More than 4 times per year   Active Member of [employer] or Organizations: Yes   Attends Archivist Meetings: 1 to 4 times per year   Marital Status: Married    No current facility-administered medications on file prior to encounter.   Current Outpatient Medications on File Prior to Encounter  Medication Sig Dispense Refill   acetaminophen (TYLENOL) 500 MG tablet Take 1,000 mg by mouth every 6 (six) hours as needed for moderate pain.     Ascorbic Acid (VITAMIN C PO) Take 1 tablet by mouth daily.     b complex vitamins capsule Take 1 capsule by mouth daily.     CRANBERRY PO Take 1 capsule by mouth daily.     Ferrous Sulfate (IRON PO) Take 1 tablet by mouth daily.     gabapentin (NEURONTIN) 100 MG capsule TAKE 2 CAPSULES BY MOUTH AT BEDTIME. (Patient taking differently: Take 200 mg by mouth at bedtime.) [id number] capsule 0   GARLIC PO Take 1 capsule by mouth daily.     glipiZIDE (GLUCOTROL) 10 MG tablet TAKE 1 TABLET (10 MG TOTAL) BY MOUTH 2 (TWO) TIMES DAILY BEFORE A MEAL. 180 tablet 3   hydrochlorothiazide (HYDRODIURIL) 25 MG tablet TAKE 1 TABLET BY MOUTH EVERY DAY (Patient taking differently: Take 25 mg by mouth daily.) 90 tablet 0   metFORMIN (GLUCOPHAGE) 1000 MG tablet TAKE 1 TABLET BY MOUTH TWICE DAILY WITH A MEAL (Patient taking differently: Take 1,000 mg by mouth 2 (two) times daily with a meal. TAKE 1 TABLET BY MOUTH TWICE DAILY WITH A MEAL) 180 tablet 0   metoprolol succinate (TOPROL-XL) 100 MG 24 hr tablet TAKE 1 TABLET BY MOUTH WITH OR IMMEDIATELY FOLLOWING A MEAL. (Patient taking differently: Take 100 mg by mouth daily.) 90 tablet 0   Naphazoline-Pheniramine (VISINE-A OP) Place 1 drop into both eyes daily as needed (dry eyes).     simvastatin (ZOCOR) 10 MG tablet TAKE 1 TABLET BY MOUTH EVERY DAY (Patient taking differently: Take 10 mg by mouth at bedtime.) 90 tablet 3   TURMERIC PO Take 1 capsule by mouth daily.      VITAMIN D PO Take 1 capsule by mouth daily.     docusate sodium (COLACE) 100 MG capsule Take 1 capsule (100 mg total) by mouth daily as needed for up to 30 doses. (Patient not taking: No sig reported) 30 capsule 0    Review of Systems: a complete, 10pt review of systems was completed with pertinent positives and negatives as documented in the HPI  Physical Exam: Vitals:   01/18/21 0000 01/18/21 0030  BP: (!) 147/80 139/73  Pulse: 66 (!) 57  Resp: 15 16  Temp:    SpO2: 97% 95%   Gen: A&Ox3, no distress  Eyes: Extraocular motions intact, no scleral icterus Chest: respiratory effort is normal.  Symmetrical chest rise Cardiovascular: RRR with palpable distal pulses, no pedal edema Gastrointestinal: soft, mildly distended, nontender.  Muscoloskeletal: no clubbing or cyanosis of the fingers.  Strength is symmetrical throughout.   Neuro: cranial nerves grossly intact.  Sensation intact to light touch diffusely. Psych: appropriate mood and affect, normal insight/judgment intact  Skin: warm and dry   CBC Latest Ref Rng & Units 01/17/2021 10/14/2020 03/23/2020  WBC 4.0 - 10.5 K/uL 10.0 4.2 4.0  Hemoglobin 13.0 - 17.0 g/dL 12.3(L) 12.1(L) 11.5(L)  Hematocrit 39.0 - 52.0 % 38.6(L) 36.8(L) 36.7(L)  Platelets 150 - 400 K/uL 280 252.0 233    CMP Latest Ref Rng & Units 01/17/2021 10/14/2020 03/23/2020  Glucose 70 - 99 mg/dL 101(B) 78 51(W)  BUN 8 - 23 mg/dL 23 20 21   Creatinine 0.61 - 1.24 mg/dL ) 2.58(N) 2.77(O)  Sodium 135 - 145 mmol/L 139 140 140  Potassium 3.5 - 5.1 mmol/L 4.8 4.8 5.2(H)  Chloride 98 - 111 mmol/L 102 105 105  CO2 22 - 32 mmol/L 29 31 27   Calcium 8.9 - 10.3 mg/dL 9.6 9.5 9.4  Total Protein 6.5 - 8.1 g/dL 8.1 6.8 -  Total Bilirubin 0.3 - 1.2 mg/dL 0.5 0.4 -  Alkaline Phos 38 - 126 U/L 78 - -  AST 15 - 41 U/L 19 15 -  ALT 0 - 44 U/L 14 12 -    No results found for: INR, PROTIME  Imaging: CT ABDOMEN PELVIS W CONTRAST  Result Date: 01/17/2021 CLINICAL  DATA:  Abdominal distension with nausea and vomiting since yesterday. EXAM: CT ABDOMEN AND PELVIS WITH CONTRAST TECHNIQUE: Multidetector CT imaging of the abdomen and pelvis was performed using the standard protocol following bolus administration of intravenous contrast. CONTRAST:  80mL OMNIPAQUE IOHEXOL 350 MG/ML SOLN COMPARISON:  Noncontrast abdominopelvic CT 11/20/2019 FINDINGS: Lower chest: Elevated right hemidiaphragm with associated right basilar atelectasis. No significant pleural or pericardial effusion. LAD coronary artery calcification versus stent. Mild aortic atherosclerosis. Hepatobiliary: The liver is normal in density without suspicious focal abnormality. No evidence of gallstones, gallbladder wall thickening or biliary dilatation. Pancreas: Unremarkable. No pancreatic ductal dilatation or surrounding inflammatory changes. Spleen: Normal in size without focal abnormality. Adrenals/Urinary Tract: Both adrenal glands appear normal. The kidneys appear normal without evidence of urinary tract calculus, suspicious lesion or hydronephrosis. No bladder abnormalities are seen. Stomach/Bowel: A small amount of enteric contrast was administered. The stomach and duodenum are not significantly dilated, although there are multiple mildly to moderately dilated loops of jejunum and proximal ileum in the central abdomen with associated air-levels. The distal small bowel is decompressed. Transition point appears to be in the left mid abdomen where there are multiple intraluminal lucencies within the small bowel (coronal image 37/4 and sagittal image 140/5), suspicious for an obstructing bezoar. Retrocecal appendix appears normal. There is stool throughout the colon which is normal in caliber. No colonic wall thickening or surrounding inflammation. Vascular/Lymphatic: There are no enlarged abdominal or pelvic lymph nodes. Mild aortic and branch vessel atherosclerosis without acute vascular findings. The portal,  superior mesenteric and splenic veins are patent. Reproductive: The prostate gland and seminal vesicles appear normal. Other: Mild interloop fluid between the dilated loops of small bowel in the right anterior mid abdomen. No focal extraluminal fluid collection or free air. No evidence of abdominal wall hernia.  Musculoskeletal: No acute or significant osseous findings. Multilevel lumbar spondylosis with a degenerative anterolisthesis at L4-5. IMPRESSION: 1. Mid small bowel obstruction with associated prominent intraluminal lucencies proximal to the level of transition in the left mid abdomen, suspicious for a possible obstructing bezoar. Mild associated interloop fluid without bowel wall thickening or evidence of perforation. 2. The appendix and colon appear normal. 3.  Aortic Atherosclerosis (ICD10-I70.0). Electronically Signed   By: Richardean Sale M.D.   On: 01/17/2021 17:00     A/P: 70 year old man with small bowel obstruction which appears to be due to bezoar.  Has already had some symptomatic improvement.  Abdomen is benign and labs/vitals reassuring.  Continue bowel rest, fluid resuscitation, symptom directed supportive care.  Will initiate the small bowel obstruction protocol this morning in hopes of both a diagnostic and cathartic effect.    Patient Active Problem List   Diagnosis Date Noted   SOB (shortness of breath) 01/17/2021   SBO (small bowel obstruction) ([place]) 01/17/2021   AKI (acute kidney injury) ([place]) 01/17/2021   Bilateral primary osteoarthritis of knee 04/06/2020   Right-sided low back pain with right-sided sciatica 01/01/2020   Bilateral knee pain 01/01/2020   Aortic atherosclerosis ([place]) 12/11/2019   Hyperlipidemia associated with type 2 diabetes mellitus ([place]) 02/21/2017   Erectile dysfunction 09/01/2014   Anemia, chronic disease 07/14/2009   Diabetes mellitus type II, controlled ([place]) 11/25/2006   Essential hypertension 11/25/2006       Romana Juniper, MD [facility]  See AMION to contact appropriate on-call provider

## 2021-01-19 ENCOUNTER — Inpatient Hospital Stay (HOSPITAL_COMMUNITY): Payer: Medicare PPO

## 2021-01-19 ENCOUNTER — Other Ambulatory Visit: Payer: Self-pay

## 2021-01-19 DIAGNOSIS — K56609 Unspecified intestinal obstruction, unspecified as to partial versus complete obstruction: Secondary | ICD-10-CM | POA: Diagnosis not present

## 2021-01-19 LAB — BASIC METABOLIC PANEL
Anion gap: 13 (ref 5–15)
BUN: 29 mg/dL — ABNORMAL HIGH (ref 8–23)
CO2: 26 mmol/L (ref 22–32)
Calcium: 9.6 mg/dL (ref 8.9–10.3)
Chloride: 101 mmol/L (ref 98–111)
Creatinine, Ser: 1.4 mg/dL — ABNORMAL HIGH (ref 0.61–1.24)
GFR, Estimated: 54 mL/min — ABNORMAL LOW (ref >60)
Glucose, Bld: 284 mg/dL — ABNORMAL HIGH (ref 70–99)
Potassium: 4.2 mmol/L (ref 3.5–5.1)
Sodium: 140 mmol/L (ref 135–145)

## 2021-01-19 LAB — GLUCOSE, CAPILLARY
Glucose-Capillary: 192 mg/dL — ABNORMAL HIGH (ref 70–99)
Glucose-Capillary: 197 mg/dL — ABNORMAL HIGH (ref 70–99)
Glucose-Capillary: 224 mg/dL — ABNORMAL HIGH (ref 70–99)
Glucose-Capillary: 224 mg/dL — ABNORMAL HIGH (ref 70–99)
Glucose-Capillary: 244 mg/dL — ABNORMAL HIGH (ref 70–99)

## 2021-01-19 MED ORDER — INSULIN ASPART 100 UNIT/ML IJ SOLN
0.0000 [IU] | INTRAMUSCULAR | Status: DC
Start: 2021-01-19 — End: 2021-01-21
  Administered 2021-01-19 (×2): 2 [IU] via SUBCUTANEOUS
  Administered 2021-01-19: 21:00:00 3 [IU] via SUBCUTANEOUS
  Administered 2021-01-20: 5 [IU] via SUBCUTANEOUS
  Administered 2021-01-20 (×2): 2 [IU] via SUBCUTANEOUS
  Administered 2021-01-20: 3 [IU] via SUBCUTANEOUS
  Administered 2021-01-21: 1 [IU] via SUBCUTANEOUS
  Administered 2021-01-21: 2 [IU] via SUBCUTANEOUS
  Administered 2021-01-21: 1 [IU] via SUBCUTANEOUS

## 2021-01-19 NOTE — Progress Notes (Signed)
After read on xray, this nurse attempted to advance his NG tube. Patient began vomitting and NG tube coiled and came out. Will reinsert NG tube and reconfirm with xray.

## 2021-01-19 NOTE — Progress Notes (Deleted)
Mark Ali [address] [place] [address] Phone: [phone] Subjective:    I'm seeing this patient by the request  of:  Mark Majestic, MD  CC:   Mark Ali  10/10/2020 Patient is making progress with him increasing his activity at the moment.  Discussed with patient that we could consider the possibility of advanced imaging with the facet arthropathy as well as the grade 1 anterior listhesis.  Patient wants to continue with conservative therapy.  No change in medication but does have the gabapentin.  Follow-up again in 2 to 3 months Known arthritic changes right greater than left.  Patient does seems to be stable at the moment.  Patient denies any significant instability but would consider the possibility of custom bracing secondary to the abnormal thigh to calf ratio if necessary.  In addition to this we discussed the possibility of viscosupplementation if possible or if needed.  Follow-up with me again 2 to 3 months  Updated 01/19/2021 Mark Cunas Sr. is a 70 y.o. male coming in with complaint of bilateral knee pain  Onset-  Location Duration-  Character- Aggravating factors- Reliving factors-  Therapies tried-  Severity-     Past Medical History:  Diagnosis Date   Diabetes type 2, controlled ([facility])    Hyperlipidemia    Hypertension    UNSPECIFIED TACHYCARDIA 05/06/2007   Listed 05/06/2007- do not see recurrent tachycardia since that time    Past Surgical History:  Procedure Laterality Date   CYSTOSCOPY WITH BIOPSY N/A 03/25/2020   Procedure: CYSTOSCOPY WITH PROSTATE UERTHRAL  BIOPSY/ TRANSURETHRAL RESECTION OF PROSTATE/ BLADDER BIOPSY/ BILATERAL RETROGRADE/ URETEROSCOPY;  Surgeon: Jannifer Hick, MD;  Location: [facility];  Service: Urology;  Laterality: N/A;   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 Wife and son patient here. 2 grandkids.       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    [employer] undergrad   1978 [employer]- 4 degrees through 2000- [employer], math. 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    Social Determinants of Health   Financial Resource Strain: Low Risk    Difficulty of Paying Living Expenses: Not hard at all  Food Insecurity: No Food Insecurity   Worried About Programme researcher, broadcasting/film/video in the Last Year: Never true   Ran Out of  Food in the Last Year: Never true  Transportation Needs: No Transportation Needs   Lack of Transportation (Medical): No   Lack of Transportation (Non-Medical): No  Physical Activity: Sufficiently Active   Days of Exercise per Week: 5 days   Minutes of Exercise per Session: 50 min  Stress: No Stress Concern Present   Feeling of Stress : Not at all  Social Connections: Socially Integrated   Frequency of Communication with Friends and Family: More than three times a week   Frequency of Social Gatherings with Friends and Family: More than three times a  week   Attends Religious Services: More than 4 times per year   Active Member of [employer] or Organizations: Yes   Attends Archivist Meetings: 1 to 4 times per year   Marital Status: Married   No Known Allergies Family History  Problem Relation Age of Onset   Diabetes Other    Kidney disease Other    Other Mother        respiratory failure died in 14s   Alcohol abuse Father    Lung disease Father        smoking   Heart disease Sister        heart attack 58   Alcohol abuse Brother    Cirrhosis Brother    Kidney disease Sister    COPD Sister        led to death. procedural complication   Diabetes Brother       Facility-Administered Medications Ordered in Other Visits (Endocrine & Metabolic):    insulin aspart (novoLOG) injection 0-9 Units    Facility-Administered Medications Ordered in Other Visits (Cardiovascular):    hydrALAZINE (APRESOLINE) injection 10 mg      Facility-Administered Medications Ordered in Other Visits (Analgesics):    morphine 2 MG/ML injection 2 mg    Facility-Administered Medications Ordered in Other Visits (Hematological):    enoxaparin (LOVENOX) injection 40 mg    Facility-Administered Medications Ordered in Other Visits (Other):    0.9 %  sodium chloride infusion   Barium Sulfate 2.1 % SUSP 450 mL   ondansetron (ZOFRAN) injection 4 mg   prochlorperazine (COMPAZINE) injection 10 mg No current facility-administered medications for this visit. No current outpatient medications on file.   Reviewed prior external information including notes and imaging from  primary care provider As well as notes that were available from care everywhere and other healthcare systems.  Past medical history, social, surgical and family history all reviewed in electronic medical record.  No pertanent information unless stated regarding to the chief complaint.   Review of Systems:  No headache, visual changes, nausea, vomiting, diarrhea,  constipation, dizziness, abdominal pain, skin rash, fevers, chills, night sweats, weight loss, swollen lymph nodes, body aches, joint swelling, chest pain, shortness of breath, mood changes. POSITIVE muscle aches  Objective  There were no vitals taken for this visit.   General: No apparent distress alert and oriented x3 mood and affect normal, dressed appropriately.  HEENT: Pupils equal, extraocular movements intact  Respiratory: Patient's speak in full sentences and does not appear short of breath  Cardiovascular: No lower extremity edema, non tender, no erythema  Gait normal with good balance and coordination.  MSK:  Non tender with full range of motion and good stability and symmetric strength and tone of shoulders, elbows, wrist, hip, knee and ankles bilaterally.     Impression and Recommendations:     The above documentation has been reviewed and is accurate and complete  Belva Agee

## 2021-01-19 NOTE — Plan of Care (Signed)
  Problem: Coping: Goal: Level of anxiety will decrease Outcome: Progressing   Problem: Pain Managment: Goal: General experience of comfort will improve Outcome: Progressing   Problem: Safety: Goal: Ability to remain free from injury will improve Outcome: Progressing   

## 2021-01-19 NOTE — Progress Notes (Signed)
PROGRESS NOTE    Mark Kochan Sr.  [id number] DOB: 26-Jan-1951 DOA: 01/17/2021 PCP: [name], MD   Chief Complain:And pain,nausea and vomiting  Brief Narrative:   Mark Mayers Sr. is a 70 y.o. male with medical history significant of type 2 diabetes, hypertension, hyperlipidemia who presented with 2-day history of abdominal pain, vomiting, nausea. On presentation, his abdominal was not significantly distended or tender.  CT abdomen/pelvis showed SBO due to small bowel bezoar.  General surgery also consulted and following. Currently on conservative management with NG tube.   Assessment & Plan:   Principal Problem:   SBO (small bowel obstruction) (HCC) Active Problems:   Diabetes mellitus type II, controlled ([place])   Essential hypertension   AKI (acute kidney injury) ([place])   Small bowel obstruction (HCC)   SBO: Presented with abdominal pain, nausea and vomiting.  CT abdomen as above.  General surgery consulted.On NG tube now.  Abdominal x-ray this morning showed ongoing small bowel obstruction, dilated bowel loops.  Continue pain medicine, antiemetics   CKD stage II: On reviewing his previous lab work, his creatinine ranges from 1.5-1.6.  Currently kidney function at baseline.  Continue IV fluids.  Check BMP tomorrow   Diabetes type 2: Takes glipizide, metformin at home.  These medications are on hold for now.  Continue sliding scale insulin.  Monitor blood sugars   Hypertension: Currently hypertensive.  Takes metoprolol, hydrochlorothiazide at home.  Continue as needed medications for severe hypertension   History of hyperlipidemia: On simvastatin at home   Prolonged QT: QTC of 575 on presentation,improved to 477 today.    Leukocytosis: Most likely reactive.  Continue to monitor.Check CBC tomorrow         DVT prophylaxis: Lovenox Code Status: Full code Family Communication: Wife at bedside Status is: Inpatient    Consultants:  Surgery  Procedures:None  Antimicrobials:  Anti-infectives (From admission, onward)    None       Subjective:  Patient seen and examined the bedside this morning.  Hemodynamically stable.  He was still not on NG tube until this morning.  We discussed the importance of putting the NG tube now.  He states he is still nauseated and vomiting intermittently but denies significant abdominal pain.  Passing gas.  Objective: Vitals:   01/18/21 2046 01/19/21 0034 01/19/21 0439 01/19/21 0743  BP: (!) 160/76 (!) 147/68 (!) 159/83   Pulse: 83 85 83   Resp: 16 16 14    Temp: 98.6 F (37 C) 98.4 F (36.9 C) 98.4 F (36.9 C)   TempSrc: Oral Oral Oral   SpO2: 96% 99% 97%   Weight:    96 kg  Height:    6\' 4"  (1.93 m)    Intake/Output Summary (Last 24 hours) at 01/19/2021 0749 Last data filed at 01/19/2021 0430 Gross per 24 hour  Intake 2652.26 ml  Output 200 ml  Net 2452.26 ml   Filed Weights   01/19/21 0743  Weight: 96 kg    Examination:  General exam: Overall comfortable, not in distress HEENT: PERRL Respiratory system:  no wheezes or crackles  Cardiovascular system: S1 & S2 heard, RRR.  Gastrointestinal system: Abdomen is mild distended, soft, no bowel sounds heard  Central nervous system: Alert and oriented Extremities: No edema, no clubbing ,no cyanosis Skin: No rashes, no ulcers,no icterus     Data Reviewed: I have personally reviewed following labs and imaging studies  CBC: Recent Labs  Lab 01/17/21 1109 01/18/21 0354  WBC 10.0  13.4*  HGB 12.3* 11.8*  HCT 38.6* 36.8*  MCV 97.7 97.1  PLT 280 268   Basic Metabolic Panel: Recent Labs  Lab 01/17/21 1109 01/18/21 0354 01/19/21 0512  NA 139 139 140  K 4.8 4.3 4.2  CL 102 103 101  CO2 29 29 26   GLUCOSE 286* 206* 284*  BUN 23 26* 29*  CREATININE 1.45* 1.51* 1.40*  CALCIUM 9.6 9.4 9.6   GFR: Estimated Creatinine Clearance: 59.4 mL/min (A) (by C-G formula based on SCr of 1.4 mg/dL (H)). Liver Function  Tests: Recent Labs  Lab 01/17/21 1109  AST 19  ALT 14  ALKPHOS 78  BILITOT 0.5  PROT 8.1  ALBUMIN 4.2   Recent Labs  Lab 01/17/21 1109  LIPASE 29   No results for input(s): AMMONIA in the last 168 hours. Coagulation Profile: No results for input(s): INR, PROTIME in the last 168 hours. Cardiac Enzymes: No results for input(s): CKTOTAL, CKMB, CKMBINDEX, TROPONINI in the last 168 hours. BNP (last 3 results) No results for input(s): PROBNP in the last 8760 hours. HbA1C: No results for input(s): HGBA1C in the last 72 hours. CBG: Recent Labs  Lab 01/18/21 0740 01/18/21 1218 01/18/21 1751 01/18/21 2124 01/19/21 0728  GLUCAP 186* 143* 168* 179* 244*   Lipid Profile: No results for input(s): CHOL, HDL, LDLCALC, TRIG, CHOLHDL, LDLDIRECT in the last 72 hours. Thyroid Function Tests: No results for input(s): TSH, T4TOTAL, FREET4, T3FREE, THYROIDAB in the last 72 hours. Anemia Panel: No results for input(s): VITAMINB12, FOLATE, FERRITIN, TIBC, IRON, RETICCTPCT in the last 72 hours. Sepsis Labs: No results for input(s): PROCALCITON, LATICACIDVEN in the last 168 hours.  Recent Results (from the past 240 hour(s))  Resp Panel by RT-PCR (Flu A&B, Covid) Nasopharyngeal Swab     Status: None   Collection Time: 01/18/21  3:56 AM   Specimen: Nasopharyngeal Swab; Nasopharyngeal(NP) swabs in vial transport medium  Result Value Ref Range Status   SARS Coronavirus 2 by RT PCR NEGATIVE NEGATIVE Final    Comment: (NOTE) SARS-CoV-2 target nucleic acids are NOT DETECTED.  The SARS-CoV-2 RNA is generally detectable in upper respiratory specimens during the acute phase of infection. The lowest concentration of SARS-CoV-2 viral copies this assay can detect is 138 copies/mL. A negative result does not preclude SARS-Cov-2 infection and should not be used as the sole basis for treatment or other patient management decisions. A negative result may occur with  improper specimen  collection/handling, submission of specimen other than nasopharyngeal swab, presence of viral mutation(s) within the areas targeted by this assay, and inadequate number of viral copies(<138 copies/mL). A negative result must be combined with clinical observations, patient history, and epidemiological information. The expected result is Negative.  Fact Sheet for Patients:  01/20/21  Fact Sheet for Healthcare Providers:  BloggerCourse.com  This test is no t yet approved or cleared by the [employer] FDA and  has been authorized for detection and/or diagnosis of SARS-CoV-2 by FDA under an Emergency Use Authorization (EUA). This EUA will remain  in effect (meaning this test can be used) for the duration of the COVID-19 declaration under Section 564(b)(1) of the Act, 21 U.S.C.section 360bbb-3(b)(1), unless the authorization is terminated  or revoked sooner.       Influenza A by PCR NEGATIVE NEGATIVE Final   Influenza B by PCR NEGATIVE NEGATIVE Final    Comment: (NOTE) The Xpert Xpress SARS-CoV-2/FLU/RSV plus assay is intended as an aid in the diagnosis of influenza from Nasopharyngeal swab specimens and  should not be used as a sole basis for treatment. Nasal washings and aspirates are unacceptable for Xpert Xpress SARS-CoV-2/FLU/RSV testing.  Fact Sheet for Patients: EntrepreneurPulse.com.au  Fact Sheet for Healthcare Providers: IncredibleEmployment.be  This test is not yet approved or cleared by the [place] FDA and has been authorized for detection and/or diagnosis of SARS-CoV-2 by FDA under an Emergency Use Authorization (EUA). This EUA will remain in effect (meaning this test can be used) for the duration of the COVID-19 declaration under Section 564(b)(1) of the Act, 21 U.S.C. section 360bbb-3(b)(1), unless the authorization is terminated or revoked.  Performed at [facility], [place] [address] [place], [place] [address]          Radiology Studies: CT ABDOMEN PELVIS W CONTRAST  Result Date: 01/17/2021 CLINICAL DATA:  Abdominal distension with nausea and vomiting since yesterday. EXAM: CT ABDOMEN AND PELVIS WITH CONTRAST TECHNIQUE: Multidetector CT imaging of the abdomen and pelvis was performed using the standard protocol following bolus administration of intravenous contrast. CONTRAST:  7mL OMNIPAQUE IOHEXOL 350 MG/ML SOLN COMPARISON:  Noncontrast abdominopelvic CT 11/20/2019 FINDINGS: Lower chest: Elevated right hemidiaphragm with associated right basilar atelectasis. No significant pleural or pericardial effusion. LAD coronary artery calcification versus stent. Mild aortic atherosclerosis. Hepatobiliary: The liver is normal in density without suspicious focal abnormality. No evidence of gallstones, gallbladder wall thickening or biliary dilatation. Pancreas: Unremarkable. No pancreatic ductal dilatation or surrounding inflammatory changes. Spleen: Normal in size without focal abnormality. Adrenals/Urinary Tract: Both adrenal glands appear normal. The kidneys appear normal without evidence of urinary tract calculus, suspicious lesion or hydronephrosis. No bladder abnormalities are seen. Stomach/Bowel: A small amount of enteric contrast was administered. The stomach and duodenum are not significantly dilated, although there are multiple mildly to moderately dilated loops of jejunum and proximal ileum in the central abdomen with associated air-levels. The distal small bowel is decompressed. Transition point appears to be in the left mid abdomen where there are multiple intraluminal lucencies within the small bowel (coronal image 37/4 and sagittal image 140/5), suspicious for an obstructing bezoar. Retrocecal appendix appears normal. There is stool throughout the colon which is normal in caliber. No colonic wall thickening or surrounding inflammation.  Vascular/Lymphatic: There are no enlarged abdominal or pelvic lymph nodes. Mild aortic and branch vessel atherosclerosis without acute vascular findings. The portal, superior mesenteric and splenic veins are patent. Reproductive: The prostate gland and seminal vesicles appear normal. Other: Mild interloop fluid between the dilated loops of small bowel in the right anterior mid abdomen. No focal extraluminal fluid collection or free air. No evidence of abdominal wall hernia. Musculoskeletal: No acute or significant osseous findings. Multilevel lumbar spondylosis with a degenerative anterolisthesis at L4-5. IMPRESSION: 1. Mid small bowel obstruction with associated prominent intraluminal lucencies proximal to the level of transition in the left mid abdomen, suspicious for a possible obstructing bezoar. Mild associated interloop fluid without bowel wall thickening or evidence of perforation. 2. The appendix and colon appear normal. 3.  Aortic Atherosclerosis (ICD10-I70.0). Electronically Signed   By: Richardean Sale M.D.   On: 01/17/2021 17:00   DG Abd Portable 1V  Result Date: 01/18/2021 CLINICAL DATA:  8 hour post contrast, small-bowel protocol. EXAM: PORTABLE ABDOMEN - 1 VIEW COMPARISON:  Abdominal x-ray 01/18/2021, 4:06 a.m. CT abdomen and pelvis 01/17/2021. FINDINGS: Oral contrast is seen throughout dilated small bowel loops in the mid and upper abdomen. Small bowel loops measure up to 4.2 cm. No contrast is seen within distal small bowel or  colon at this time. No suspicious calcifications. There are phleboliths in the left pelvis. No acute fractures. IMPRESSION: 1. Contrast is seen within dilated mid and proximal small bowel only. Findings are concerning for small-bowel obstruction. Electronically Signed   By: Ronney Asters M.D.   On: 01/18/2021 23:50   DG Abd Portable 1V-Small Bowel Obstruction Protocol-initial, 8 hr delay  Result Date: 01/18/2021 CLINICAL DATA:  70 year old male with small bowel  obstruction by CT yesterday. EXAM: PORTABLE ABDOMEN - 1 VIEW COMPARISON:  CT Abdomen and Pelvis 01/17/2021. FINDINGS: AP views at 0405 hours. Bowel-gas pattern appears mildly improved since yesterday, although the dilated small bowel loops were largely occult on the CT scout view. Other abdominal and pelvic visceral contours appear normal; partially visible excreted IV contrast in the urinary bladder. No acute osseous abnormality identified. Negative visible left lung base. IMPRESSION: Bowel-gas pattern appears improved since yesterday, although dilated small bowel loops were largely occult on the CT scout view. Electronically Signed   By: Genevie Ann M.D.   On: 01/18/2021 04:23        Scheduled Meds:  Barium Sulfate  450 mL Oral Once   enoxaparin (LOVENOX) injection  40 mg Subcutaneous Q24H   insulin aspart  0-5 Units Subcutaneous QHS   insulin aspart  0-9 Units Subcutaneous TID WC   Continuous Infusions:  sodium chloride 100 mL/hr at 01/19/21 0430     LOS: 1 day    Time spent: 25 mins.More than 50% of that time was spent in counseling and/or coordination of care.      Shelly Coss, MD [facility] P11/17/2022, 7:49 AM

## 2021-01-19 NOTE — Progress Notes (Signed)
Subjective: CC: Patient reports no current abdominal pain, however has received morphine x 7 yesterday and x4 since midnight. He reports that he will get 8/10, irritating pain in his epigastrium radiating to the right side of his abdomen that occurs ~ 2 hours and subsides after morphine. Notes intermittent nausea with at least 2 large episodes of bilious emesis yesterday and 1 this morning. Feels distended. Continues to pass flatus. No BM. Has not mobilized.   Objective: Vital signs in last 24 hours: Temp:  [98 F (36.7 C)-98.6 F (37 C)] 98.4 F (36.9 C) (11/17 0439) Pulse Rate:  [66-85] 83 (11/17 0439) Resp:  [14-21] 14 (11/17 0439) BP: (130-160)/(58-85) 159/83 (11/17 0439) SpO2:  [96 %-100 %] 97 % (11/17 0439) Weight:  [96 kg] 96 kg (11/17 0743) Last BM Date: 01/16/21  Intake/Output from previous day: 11/16 0701 - 11/17 0700 In: 2652.3 [I.V.:2652.3] Out: 200 [Emesis/NG output:200] Intake/Output this shift: No intake/output data recorded.  PE: Gen:  Alert, NAD, pleasant Lungs: Rate and effort normal Abd: Soft, mild distension, NT +BS Ext:  No LE edema Psych: A&Ox3  Skin: no rashes noted, warm and dry  Lab Results:  Recent Labs    01/17/21 1109 01/18/21 0354  WBC 10.0 13.4*  HGB 12.3* 11.8*  HCT 38.6* 36.8*  PLT 280 268   BMET Recent Labs    01/18/21 0354 01/19/21 0512  NA 139 140  K 4.3 4.2  CL 103 101  CO2 29 26  GLUCOSE 206* 284*  BUN 26* 29*  CREATININE 1.51* 1.40*  CALCIUM 9.4 9.6   PT/INR No results for input(s): LABPROT, INR in the last 72 hours. CMP     Component Value Date/Time   NA 140 01/19/2021 0512   K 4.2 01/19/2021 0512   CL 101 01/19/2021 0512   CO2 26 01/19/2021 0512   GLUCOSE 284 (H) 01/19/2021 0512   BUN 29 (H) 01/19/2021 0512   CREATININE 1.40 (H) 01/19/2021 0512   CREATININE 1.50 (H) 10/14/2020 0910   CALCIUM 9.6 01/19/2021 0512   PROT 8.1 01/17/2021 1109   ALBUMIN 4.2 01/17/2021 1109   AST 19 01/17/2021 1109    ALT 14 01/17/2021 1109   ALKPHOS 78 01/17/2021 1109   BILITOT 0.5 01/17/2021 1109   GFRNONAA 54 (L) 01/19/2021 0512   GFRNONAA 41 (L) 02/12/2020 1434   GFRAA 47 (L) 02/12/2020 1434   Lipase     Component Value Date/Time   LIPASE 29 01/17/2021 1109    Studies/Results: CT ABDOMEN PELVIS W CONTRAST  Result Date: 01/17/2021 CLINICAL DATA:  Abdominal distension with nausea and vomiting since yesterday. EXAM: CT ABDOMEN AND PELVIS WITH CONTRAST TECHNIQUE: Multidetector CT imaging of the abdomen and pelvis was performed using the standard protocol following bolus administration of intravenous contrast. CONTRAST:  35mL OMNIPAQUE IOHEXOL 350 MG/ML SOLN COMPARISON:  Noncontrast abdominopelvic CT 11/20/2019 FINDINGS: Lower chest: Elevated right hemidiaphragm with associated right basilar atelectasis. No significant pleural or pericardial effusion. LAD coronary artery calcification versus stent. Mild aortic atherosclerosis. Hepatobiliary: The liver is normal in density without suspicious focal abnormality. No evidence of gallstones, gallbladder wall thickening or biliary dilatation. Pancreas: Unremarkable. No pancreatic ductal dilatation or surrounding inflammatory changes. Spleen: Normal in size without focal abnormality. Adrenals/Urinary Tract: Both adrenal glands appear normal. The kidneys appear normal without evidence of urinary tract calculus, suspicious lesion or hydronephrosis. No bladder abnormalities are seen. Stomach/Bowel: A small amount of enteric contrast was administered. The stomach and duodenum are not significantly  dilated, although there are multiple mildly to moderately dilated loops of jejunum and proximal ileum in the central abdomen with associated air-levels. The distal small bowel is decompressed. Transition point appears to be in the left mid abdomen where there are multiple intraluminal lucencies within the small bowel (coronal image 37/4 and sagittal image 140/5), suspicious for an  obstructing bezoar. Retrocecal appendix appears normal. There is stool throughout the colon which is normal in caliber. No colonic wall thickening or surrounding inflammation. Vascular/Lymphatic: There are no enlarged abdominal or pelvic lymph nodes. Mild aortic and branch vessel atherosclerosis without acute vascular findings. The portal, superior mesenteric and splenic veins are patent. Reproductive: The prostate gland and seminal vesicles appear normal. Other: Mild interloop fluid between the dilated loops of small bowel in the right anterior mid abdomen. No focal extraluminal fluid collection or free air. No evidence of abdominal wall hernia. Musculoskeletal: No acute or significant osseous findings. Multilevel lumbar spondylosis with a degenerative anterolisthesis at L4-5. IMPRESSION: 1. Mid small bowel obstruction with associated prominent intraluminal lucencies proximal to the level of transition in the left mid abdomen, suspicious for a possible obstructing bezoar. Mild associated interloop fluid without bowel wall thickening or evidence of perforation. 2. The appendix and colon appear normal. 3.  Aortic Atherosclerosis (ICD10-I70.0). Electronically Signed   By: Carey Bullocks M.D.   On: 01/17/2021 17:00   DG Abd Portable 1V  Result Date: 01/19/2021 CLINICAL DATA:  70 year old male with small bowel obstruction on CT Abdomen and Pelvis 2 days ago. EXAM: PORTABLE ABDOMEN - 1 VIEW COMPARISON:  01/18/2021 and earlier. FINDINGS: Oral contrast administered yesterday, with persistent dilated contrast fills small bowel loops 2337 hours. The oral contrast and small bowel dilatation persists. Contrast has not reached the colon. Excreted IV contrast in the urinary bladder again. Stable visualized osseous structures. IMPRESSION: Ongoing small bowel obstruction. Oral contrast continues to opacify dilated small bowel loops and has not reached the colon. Electronically Signed   By: Odessa Fleming M.D.   On: 01/19/2021 07:58    DG Abd Portable 1V  Result Date: 01/18/2021 CLINICAL DATA:  8 hour post contrast, small-bowel protocol. EXAM: PORTABLE ABDOMEN - 1 VIEW COMPARISON:  Abdominal x-ray 01/18/2021, 4:06 a.m. CT abdomen and pelvis 01/17/2021. FINDINGS: Oral contrast is seen throughout dilated small bowel loops in the mid and upper abdomen. Small bowel loops measure up to 4.2 cm. No contrast is seen within distal small bowel or colon at this time. No suspicious calcifications. There are phleboliths in the left pelvis. No acute fractures. IMPRESSION: 1. Contrast is seen within dilated mid and proximal small bowel only. Findings are concerning for small-bowel obstruction. Electronically Signed   By: Darliss Cheney M.D.   On: 01/18/2021 23:50   DG Abd Portable 1V-Small Bowel Obstruction Protocol-initial, 8 hr delay  Result Date: 01/18/2021 CLINICAL DATA:  70 year old male with small bowel obstruction by CT yesterday. EXAM: PORTABLE ABDOMEN - 1 VIEW COMPARISON:  CT Abdomen and Pelvis 01/17/2021. FINDINGS: AP views at 0405 hours. Bowel-gas pattern appears mildly improved since yesterday, although the dilated small bowel loops were largely occult on the CT scout view. Other abdominal and pelvic visceral contours appear normal; partially visible excreted IV contrast in the urinary bladder. No acute osseous abnormality identified. Negative visible left lung base. IMPRESSION: Bowel-gas pattern appears improved since yesterday, although dilated small bowel loops were largely occult on the CT scout view. Electronically Signed   By: Odessa Fleming M.D.   On: 01/18/2021 04:23  Anti-infectives: Anti-infectives (From admission, onward)    None        Assessment/Plan SBO - No hx of prior abdominal surgery. CT scan  with mid small bowel obstruction with associated prominent intraluminal lucencies proximal to the level of transition in the left mid abdomen, suspicious for a possible obstructing bezoar - Drank contrast orally yesterday  and xray this am with continued small bowel dilatation and contrast in small bowel - Patient agreeable to NGT for decompression. Keep NPO - Keep K > 4 and Mg > 2 for bowel function. AM labs written for - Mobilize for bowel function - Hopefully patient will improve with conservative management. If patient fails to improve with conservative management, he may require exploratory surgery during admission  FEN - NPO, place NGT, IVF VTE - SCDs, Lovenox ID - None   LOS: 1 day    Jacinto Halim , [facility] 01/19/2021, 9:32 AM Please see Amion for pager number during day hours 7:00am-4:30pm

## 2021-01-19 NOTE — Progress Notes (Signed)
Rapid Nurse up to assist with NG tube placement and was also unsuccessful at placement. PA notified to assist with placement.

## 2021-01-20 ENCOUNTER — Ambulatory Visit: Payer: Medicare PPO | Admitting: Family Medicine

## 2021-01-20 DIAGNOSIS — K56609 Unspecified intestinal obstruction, unspecified as to partial versus complete obstruction: Secondary | ICD-10-CM | POA: Diagnosis not present

## 2021-01-20 LAB — BASIC METABOLIC PANEL
Anion gap: 12 (ref 5–15)
BUN: 33 mg/dL — ABNORMAL HIGH (ref 8–23)
CO2: 23 mmol/L (ref 22–32)
Calcium: 8.6 mg/dL — ABNORMAL LOW (ref 8.9–10.3)
Chloride: 106 mmol/L (ref 98–111)
Creatinine, Ser: 1.65 mg/dL — ABNORMAL HIGH (ref 0.61–1.24)
GFR, Estimated: 44 mL/min — ABNORMAL LOW (ref >60)
Glucose, Bld: 207 mg/dL — ABNORMAL HIGH (ref 70–99)
Potassium: 3.7 mmol/L (ref 3.5–5.1)
Sodium: 141 mmol/L (ref 135–145)

## 2021-01-20 LAB — GLUCOSE, CAPILLARY
Glucose-Capillary: 179 mg/dL — ABNORMAL HIGH (ref 70–99)
Glucose-Capillary: 193 mg/dL — ABNORMAL HIGH (ref 70–99)
Glucose-Capillary: 236 mg/dL — ABNORMAL HIGH (ref 70–99)
Glucose-Capillary: 185 mg/dL — ABNORMAL HIGH (ref 70–99)
Glucose-Capillary: 209 mg/dL — ABNORMAL HIGH (ref 70–99)

## 2021-01-20 LAB — MAGNESIUM: Magnesium: 1.6 mg/dL — ABNORMAL LOW (ref 1.7–2.4)

## 2021-01-20 LAB — CBC
HCT: 36.2 % — ABNORMAL LOW (ref 39.0–52.0)
Hemoglobin: 11.6 g/dL — ABNORMAL LOW (ref 13.0–17.0)
MCH: 31.4 pg (ref 26.0–34.0)
MCHC: 32 g/dL (ref 30.0–36.0)
MCV: 98.1 fL (ref 80.0–100.0)
Platelets: 231 10*3/uL (ref 150–400)
RBC: 3.69 MIL/uL — ABNORMAL LOW (ref 4.22–5.81)
RDW: 12.9 % (ref 11.5–15.5)
WBC: 7.9 10*3/uL (ref 4.0–10.5)
nRBC: 0 % (ref 0.0–0.2)

## 2021-01-20 MED ORDER — MAGNESIUM SULFATE 2 GM/50ML IV SOLN
2.0000 g | Freq: Once | INTRAVENOUS | Status: AC
  Administered 2021-01-20: 2 g via INTRAVENOUS
  Filled 2021-01-20: qty 50

## 2021-01-20 NOTE — Progress Notes (Signed)
PROGRESS NOTE    Mark Pfister Sr.  [id number] DOB: 1950/10/02 DOA: 01/17/2021 PCP: [name], MD   Chief Complain:And pain,nausea and vomiting  Brief Narrative:   Mark Mayers Sr. is a 70 y.o. male with medical history significant of type 2 diabetes, hypertension, hyperlipidemia who presented with 2-day history of abdominal pain, vomiting, nausea. On presentation, his abdominal was not significantly distended or tender.  CT abdomen/pelvis showed SBO due to small bowel bezoar.  General surgery also consulted and following. Started  on conservative management .  He had a bowel movement today.  Off NG tube   Assessment & Plan:   Principal Problem:   SBO (small bowel obstruction) (HCC) Active Problems:   Diabetes mellitus type II, controlled ([name])   Essential hypertension   AKI (acute kidney injury) ([place])   Small bowel obstruction (HCC)   SBO: Presented with abdominal pain, nausea and vomiting.  CT abdomen as above.  General surgery consulted. Abdominal x-ray on 11/17 showed ongoing small bowel obstruction, dilated bowel loops.  Continue pain medicine, antiemetics as needed. Several attempts for NG tube advancement failed.  Fortunately his abdominal pain, distention and nausea much better today.  He had a bowel movement today.  He started on clear liquid diet.  We will gradually advance the diet as tolerated.  We hope that he will improve with conservative management  CKD stage II: On reviewing his previous lab work, his creatinine ranges from 1.5-1.6.  Currently kidney function at baseline.  Continue IV fluids.  Check BMP tomorrow   Diabetes type 2: Takes glipizide, metformin at home.  These medications are on hold for now.  Continue sliding scale insulin.  Monitor blood sugars   Hypertension: Currently normotensive.  Takes metoprolol, hydrochlorothiazide at home.  Continue as needed medications for severe hypertension   History of hyperlipidemia: On  simvastatin at home   Prolonged QT: QTC of 575 on presentation,improved to 477 .    Leukocytosis: Most likely reactive.  Resolved         DVT prophylaxis: Lovenox Code Status: Full code Family Communication: Wife at bedside Status is: Inpatient    Consultants: Surgery  Procedures:None  Antimicrobials:  Anti-infectives (From admission, onward)    None       Subjective:  Patient seen and examined at the bedside this morning.  Hemodynamically stable.  Feels much better today.  Without nausea or vomiting.  Had a bowel movement today.  Started on clear liquid diet.  Objective: Vitals:   01/19/21 0743 01/19/21 1318 01/19/21 2039 01/20/21 0435  BP:  (!) 156/86 (!) 142/68 121/68  Pulse:  98 (!) 109 (!) 110  Resp:  16 17 18   Temp:  98.3 F (36.8 C) 99.2 F (37.3 C) 98.8 F (37.1 C)  TempSrc:  Oral Oral Oral  SpO2:  98% 90% 94%  Weight: 96 kg     Height: 6\' 4"  (1.93 m)       Intake/Output Summary (Last 24 hours) at 01/20/2021 0749 Last data filed at 01/20/2021 0600 Gross per 24 hour  Intake 1228.07 ml  Output 300 ml  Net 928.07 ml   Filed Weights   01/19/21 0743  Weight: 96 kg    Examination:  General exam: Overall comfortable, not in distress HEENT: PERRL Respiratory system:  no wheezes or crackles  Cardiovascular system: S1 & S2 heard, RRR.  Gastrointestinal system: Abdomen is nondistended, soft and nontender.  Good bowel sounds Central nervous system: Alert and oriented Extremities: No edema,  no clubbing ,no cyanosis Skin: No rashes, no ulcers,no icterus     Data Reviewed: I have personally reviewed following labs and imaging studies  CBC: Recent Labs  Lab 01/17/21 1109 01/18/21 0354 01/20/21 0526  WBC 10.0 13.4* 7.9  HGB 12.3* 11.8* 11.6*  HCT 38.6* 36.8* 36.2*  MCV 97.7 97.1 98.1  PLT 280 268 231   Basic Metabolic Panel: Recent Labs  Lab 01/17/21 1109 01/18/21 0354 01/19/21 0512 01/20/21 0526  NA 139 139 140 141  K 4.8 4.3 4.2  3.7  CL 102 103 101 106  CO2 29 29 26 23   GLUCOSE 286* 206* 284* 207*  BUN 23 26* 29* 33*  CREATININE 1.45* 1.51* 1.40* 1.65*  CALCIUM 9.6 9.4 9.6 8.6*  MG  --   --   --  1.6*   GFR: Estimated Creatinine Clearance: 50.4 mL/min (A) (by C-G formula based on SCr of 1.65 mg/dL (H)). Liver Function Tests: Recent Labs  Lab 01/17/21 1109  AST 19  ALT 14  ALKPHOS 78  BILITOT 0.5  PROT 8.1  ALBUMIN 4.2   Recent Labs  Lab 01/17/21 1109  LIPASE 29   No results for input(s): AMMONIA in the last 168 hours. Coagulation Profile: No results for input(s): INR, PROTIME in the last 168 hours. Cardiac Enzymes: No results for input(s): CKTOTAL, CKMB, CKMBINDEX, TROPONINI in the last 168 hours. BNP (last 3 results) No results for input(s): PROBNP in the last 8760 hours. HbA1C: No results for input(s): HGBA1C in the last 72 hours. CBG: Recent Labs  Lab 01/19/21 1138 01/19/21 1628 01/19/21 1926 01/19/21 2335 01/20/21 0437  GLUCAP 224* 197* 224* 192* 179*   Lipid Profile: No results for input(s): CHOL, HDL, LDLCALC, TRIG, CHOLHDL, LDLDIRECT in the last 72 hours. Thyroid Function Tests: No results for input(s): TSH, T4TOTAL, FREET4, T3FREE, THYROIDAB in the last 72 hours. Anemia Panel: No results for input(s): VITAMINB12, FOLATE, FERRITIN, TIBC, IRON, RETICCTPCT in the last 72 hours. Sepsis Labs: No results for input(s): PROCALCITON, LATICACIDVEN in the last 168 hours.  Recent Results (from the past 240 hour(s))  Resp Panel by RT-PCR (Flu A&B, Covid) Nasopharyngeal Swab     Status: None   Collection Time: 01/18/21  3:56 AM   Specimen: Nasopharyngeal Swab; Nasopharyngeal(NP) swabs in vial transport medium  Result Value Ref Range Status   SARS Coronavirus 2 by RT PCR NEGATIVE NEGATIVE Final    Comment: (NOTE) SARS-CoV-2 target nucleic acids are NOT DETECTED.  The SARS-CoV-2 RNA is generally detectable in upper respiratory specimens during the acute phase of infection. The  lowest concentration of SARS-CoV-2 viral copies this assay can detect is 138 copies/mL. A negative result does not preclude SARS-Cov-2 infection and should not be used as the sole basis for treatment or other patient management decisions. A negative result may occur with  improper specimen collection/handling, submission of specimen other than nasopharyngeal swab, presence of viral mutation(s) within the areas targeted by this assay, and inadequate number of viral copies(<138 copies/mL). A negative result must be combined with clinical observations, patient history, and epidemiological information. The expected result is Negative.  Fact Sheet for Patients:  01/20/21  Fact Sheet for Healthcare Providers:  BloggerCourse.com  This test is no t yet approved or cleared by the [employer] FDA and  has been authorized for detection and/or diagnosis of SARS-CoV-2 by FDA under an Emergency Use Authorization (EUA). This EUA will remain  in effect (meaning this test can be used) for the duration of the COVID-19  declaration under Section 564(b)(1) of the Act, 21 U.S.C.section 360bbb-3(b)(1), unless the authorization is terminated  or revoked sooner.       Influenza A by PCR NEGATIVE NEGATIVE Final   Influenza B by PCR NEGATIVE NEGATIVE Final    Comment: (NOTE) The Xpert Xpress SARS-CoV-2/FLU/RSV plus assay is intended as an aid in the diagnosis of influenza from Nasopharyngeal swab specimens and should not be used as a sole basis for treatment. Nasal washings and aspirates are unacceptable for Xpert Xpress SARS-CoV-2/FLU/RSV testing.  Fact Sheet for Patients: EntrepreneurPulse.com.au  Fact Sheet for Healthcare Providers: IncredibleEmployment.be  This test is not yet approved or cleared by the [place] FDA and has been authorized for detection and/or diagnosis of SARS-CoV-2 by FDA under  an Emergency Use Authorization (EUA). This EUA will remain in effect (meaning this test can be used) for the duration of the COVID-19 declaration under Section 564(b)(1) of the Act, 21 U.S.C. section 360bbb-3(b)(1), unless the authorization is terminated or revoked.  Performed at [facility], [place] [address], [place], [place] [address]          Radiology Studies: DG Abd Portable 1V  Result Date: 01/19/2021 CLINICAL DATA:  Nasogastric tube placement EXAM: PORTABLE ABDOMEN - 1 VIEW COMPARISON:  Abdomen radiograph 01/19/2021 FINDINGS: The bottom of a nasogastric tube is visualized with distal port in the distal esophagus and side port also in the distal esophagus. Advance 14 cm for side port placement in the stomach. Scattered gas in indistinct loops of bowel. IMPRESSION: 1. Nasogastric tube tip is in the distal esophagus. Consider advancing 14 cm. Electronically Signed   By: Van Clines M.D.   On: 01/19/2021 12:42   DG Abd Portable 1V  Result Date: 01/19/2021 CLINICAL DATA:  70 year old male with small bowel obstruction on CT Abdomen and Pelvis 2 days ago. EXAM: PORTABLE ABDOMEN - 1 VIEW COMPARISON:  01/18/2021 and earlier. FINDINGS: Oral contrast administered yesterday, with persistent dilated contrast fills small bowel loops 2337 hours. The oral contrast and small bowel dilatation persists. Contrast has not reached the colon. Excreted IV contrast in the urinary bladder again. Stable visualized osseous structures. IMPRESSION: Ongoing small bowel obstruction. Oral contrast continues to opacify dilated small bowel loops and has not reached the colon. Electronically Signed   By: Genevie Ann M.D.   On: 01/19/2021 07:58   DG Abd Portable 1V  Result Date: 01/18/2021 CLINICAL DATA:  8 hour post contrast, small-bowel protocol. EXAM: PORTABLE ABDOMEN - 1 VIEW COMPARISON:  Abdominal x-ray 01/18/2021, 4:06 a.m. CT abdomen and pelvis 01/17/2021. FINDINGS: Oral contrast is seen  throughout dilated small bowel loops in the mid and upper abdomen. Small bowel loops measure up to 4.2 cm. No contrast is seen within distal small bowel or colon at this time. No suspicious calcifications. There are phleboliths in the left pelvis. No acute fractures. IMPRESSION: 1. Contrast is seen within dilated mid and proximal small bowel only. Findings are concerning for small-bowel obstruction. Electronically Signed   By: Ronney Asters M.D.   On: 01/18/2021 23:50        Scheduled Meds:  Barium Sulfate  450 mL Oral Once   enoxaparin (LOVENOX) injection  40 mg Subcutaneous Q24H   insulin aspart  0-9 Units Subcutaneous Q4H   Continuous Infusions:  sodium chloride 100 mL/hr at 01/19/21 2347   magnesium sulfate bolus IVPB       LOS: 2 days    Time spent: 25 mins.More than 50% of that time was spent  in counseling and/or coordination of care.      Shelly Coss, MD [facility] P11/18/2022, 7:49 AM

## 2021-01-20 NOTE — Progress Notes (Signed)
Subjective: CC: Patient reports that pain and nausea resolved overnight. No vomiting since yesterday morning. He has not required any morphine since 2343 yesterday (took total of 9x/yesterday) or any antinausea medication since a little after midnight. He reports increased flatus and normal, formed stool at 4-5am this morning. His abdomen feels less distended. Mobilizing.   Objective: Vital signs in last 24 hours: Temp:  [98.3 F (36.8 C)-99.2 F (37.3 C)] 98.8 F (37.1 C) (11/18 0435) Pulse Rate:  [98-110] 110 (11/18 0435) Resp:  [16-18] 18 (11/18 0435) BP: (121-156)/(68-86) 121/68 (11/18 0435) SpO2:  [90 %-98 %] 94 % (11/18 0435) Last BM Date: 01/16/21  Intake/Output from previous day: 11/17 0701 - 11/18 0700 In: 1228.1 [P.O.:30; I.V.:1198.1] Out: 300 [Emesis/NG output:300] Intake/Output this shift: No intake/output data recorded.  PE: Gen:  Alert, NAD, pleasant Pulm: Rate and effort normal Abd: Soft, ND, completely NT with light and deep palpation, normoactive BS Psych: A&Ox3  Skin: no rashes noted, warm and dry  Lab Results:  Recent Labs    01/18/21 0354 01/20/21 0526  WBC 13.4* 7.9  HGB 11.8* 11.6*  HCT 36.8* 36.2*  PLT 268 231   BMET Recent Labs    01/19/21 0512 01/20/21 0526  NA 140 141  K 4.2 3.7  CL 101 106  CO2 26 23  GLUCOSE 284* 207*  BUN 29* 33*  CREATININE 1.40* 1.65*  CALCIUM 9.6 8.6*   PT/INR No results for input(s): LABPROT, INR in the last 72 hours. CMP     Component Value Date/Time   NA 141 01/20/2021 0526   K 3.7 01/20/2021 0526   CL 106 01/20/2021 0526   CO2 23 01/20/2021 0526   GLUCOSE 207 (H) 01/20/2021 0526   BUN 33 (H) 01/20/2021 0526   CREATININE 1.65 (H) 01/20/2021 0526   CREATININE 1.50 (H) 10/14/2020 0910   CALCIUM 8.6 (L) 01/20/2021 0526   PROT 8.1 01/17/2021 1109   ALBUMIN 4.2 01/17/2021 1109   AST 19 01/17/2021 1109   ALT 14 01/17/2021 1109   ALKPHOS 78 01/17/2021 1109   BILITOT 0.5 01/17/2021 1109    GFRNONAA 44 (L) 01/20/2021 0526   GFRNONAA 41 (L) 02/12/2020 1434   GFRAA 47 (L) 02/12/2020 1434   Lipase     Component Value Date/Time   LIPASE 29 01/17/2021 1109    Studies/Results: DG Abd Portable 1V  Result Date: 01/19/2021 CLINICAL DATA:  Nasogastric tube placement EXAM: PORTABLE ABDOMEN - 1 VIEW COMPARISON:  Abdomen radiograph 01/19/2021 FINDINGS: The bottom of a nasogastric tube is visualized with distal port in the distal esophagus and side port also in the distal esophagus. Advance 14 cm for side port placement in the stomach. Scattered gas in indistinct loops of bowel. IMPRESSION: 1. Nasogastric tube tip is in the distal esophagus. Consider advancing 14 cm. Electronically Signed   By: Gaylyn Rong M.D.   On: 01/19/2021 12:42   DG Abd Portable 1V  Result Date: 01/19/2021 CLINICAL DATA:  70 year old male with small bowel obstruction on CT Abdomen and Pelvis 2 days ago. EXAM: PORTABLE ABDOMEN - 1 VIEW COMPARISON:  01/18/2021 and earlier. FINDINGS: Oral contrast administered yesterday, with persistent dilated contrast fills small bowel loops 2337 hours. The oral contrast and small bowel dilatation persists. Contrast has not reached the colon. Excreted IV contrast in the urinary bladder again. Stable visualized osseous structures. IMPRESSION: Ongoing small bowel obstruction. Oral contrast continues to opacify dilated small bowel loops and has not reached the colon. Electronically  Signed   By: Odessa Fleming M.D.   On: 01/19/2021 07:58   DG Abd Portable 1V  Result Date: 01/18/2021 CLINICAL DATA:  8 hour post contrast, small-bowel protocol. EXAM: PORTABLE ABDOMEN - 1 VIEW COMPARISON:  Abdominal x-ray 01/18/2021, 4:06 a.m. CT abdomen and pelvis 01/17/2021. FINDINGS: Oral contrast is seen throughout dilated small bowel loops in the mid and upper abdomen. Small bowel loops measure up to 4.2 cm. No contrast is seen within distal small bowel or colon at this time. No suspicious calcifications.  There are phleboliths in the left pelvis. No acute fractures. IMPRESSION: 1. Contrast is seen within dilated mid and proximal small bowel only. Findings are concerning for small-bowel obstruction. Electronically Signed   By: Darliss Cheney M.D.   On: 01/18/2021 23:50    Anti-infectives: Anti-infectives (From admission, onward)    None        Assessment/Plan SBO - No hx of prior abdominal surgery. CT scan  with mid small bowel obstruction with associated prominent intraluminal lucencies proximal to the level of transition in the left mid abdomen, suspicious for a possible obstructing bezoar - Patient clinically improving with resolution of abdominal pain, distension, n/v and ROBF with increased flatus and BM this AM. Plan to hold off on NGT for now and start CLD. If patient develops any abdominal pain, distension, n/v - plan to back off on diet and place NGT. - Keep K > 4 and Mg > 2 for bowel function - Mobilize for bowel function - Hopefully patient will improve with conservative management. If patient fails to improve with conservative management, he may require exploratory surgery during admission   FEN - CLD, IVF per [facility] VTE - SCDs, Lovenox ID - None   LOS: 2 days    Jacinto Halim , [facility] Surgery 01/20/2021, 8:48 AM Please see Amion for pager number during day hours 7:00am-4:30pm

## 2021-01-20 NOTE — Care Management Important Message (Signed)
Important Message  Patient Details IM Letter given to the Patient. Name: Mark [facility] Sr. MRN: [id number] Date of Birth: 01-25-1951   Medicare Important Message Given:  Yes     [name] 01/20/2021, 11:55 AM

## 2021-01-21 DIAGNOSIS — K56609 Unspecified intestinal obstruction, unspecified as to partial versus complete obstruction: Secondary | ICD-10-CM | POA: Diagnosis not present

## 2021-01-21 LAB — BASIC METABOLIC PANEL
Anion gap: 6 (ref 5–15)
BUN: 24 mg/dL — ABNORMAL HIGH (ref 8–23)
CO2: 29 mmol/L (ref 22–32)
Calcium: 8.4 mg/dL — ABNORMAL LOW (ref 8.9–10.3)
Chloride: 106 mmol/L (ref 98–111)
Creatinine, Ser: 1.4 mg/dL — ABNORMAL HIGH (ref 0.61–1.24)
GFR, Estimated: 54 mL/min — ABNORMAL LOW (ref >60)
Glucose, Bld: 176 mg/dL — ABNORMAL HIGH (ref 70–99)
Potassium: 3.9 mmol/L (ref 3.5–5.1)
Sodium: 141 mmol/L (ref 135–145)

## 2021-01-21 LAB — MAGNESIUM: Magnesium: 1.9 mg/dL (ref 1.7–2.4)

## 2021-01-21 LAB — GLUCOSE, CAPILLARY
Glucose-Capillary: 144 mg/dL — ABNORMAL HIGH (ref 70–99)
Glucose-Capillary: 149 mg/dL — ABNORMAL HIGH (ref 70–99)
Glucose-Capillary: 159 mg/dL — ABNORMAL HIGH (ref 70–99)

## 2021-01-21 NOTE — Progress Notes (Signed)
Pt discharged home with all belongings. Discharge education completed with wife at bedside. No questions or concerns at this time. Encouraged pt to f/u with PCP should any further needs arise following discharge.

## 2021-01-21 NOTE — Discharge Summary (Signed)
Physician Discharge Summary  Mark Ali Tularosa Sr. [id number] DOB: 02-Feb-1951 DOA: 01/17/2021  PCP: [name], MD  Admit date: 01/17/2021 Discharge date: 01/21/2021  Admitted From: Home Disposition:  Home  Discharge Condition:Stable CODE STATUS:FULL Diet recommendation: Soft diet for next 2-3 days  Brief/Interim Summary:  Mark Mayers Sr. is a 70 y.o. male with medical history significant of type 2 diabetes, hypertension, hyperlipidemia who presented with 2-day history of abdominal pain, vomiting, nausea. On presentation, his abdominal was not significantly distended or tender.  CT abdomen/pelvis showed SBO due to small bowel bezoar.  General surgery also consulted and following. Started  on conservative management .  SBO spontaneously resolved.  He started having bowel movement.  Tolerating soft diet.  Hemodynamically stable for discharge home today.  Following problems were addressed during his hospitalization:   SBO: Presented with abdominal pain, nausea and vomiting.  CT abdomen as above.  General surgery consulted. Abdominal x-ray on 11/17 showed ongoing small bowel obstruction, dilated bowel loops.  Continue pain medicine, antiemetics as needed. Several attempts for NG tube advancement failed.  Fortunately his abdominal pain, distention and nausea started getting better.  He had a bowel movement he started having bowel movements, tolerating soft diet, stable for discharge.  As per general surgery he does not need outpatient follow-up.  CKD stage II: On reviewing his previous lab work, his creatinine ranges from 1.5-1.6.  Currently kidney function at baseline.   Diabetes type 2: Takes glipizide, metformin at home.   Hypertension.  Takes metoprolol, hydrochlorothiazide at home.  History of hyperlipidemia: On simvastatin at home   Prolonged QT: QTC of 575 on presentation,improved to 477 .     Leukocytosis: Most likely reactive.  Resolved  Discharge  Diagnoses:  Principal Problem:   SBO (small bowel obstruction) (HCC) Active Problems:   Diabetes mellitus type II, controlled ([place])   Essential hypertension   AKI (acute kidney injury) ([place])   Small bowel obstruction [facility])    Discharge Instructions  Discharge Instructions     Diet general   Complete by: As directed    Take soft diet for next 2-3 days   Discharge instructions   Complete by: As directed    1)Please take soft diet for next 2-3 days   Increase activity slowly   Complete by: As directed       Allergies as of 01/21/2021   No Known Allergies      Medication List     TAKE these medications    acetaminophen 500 MG tablet Commonly known as: TYLENOL Take 1,000 mg by mouth every 6 (six) hours as needed for moderate pain.   b complex vitamins capsule Take 1 capsule by mouth daily.   CRANBERRY PO Take 1 capsule by mouth daily.   docusate sodium 100 MG capsule Commonly known as: Colace Take 1 capsule (100 mg total) by mouth daily as needed for up to 30 doses.   gabapentin 100 MG capsule Commonly known as: NEURONTIN TAKE 2 CAPSULES BY MOUTH AT BEDTIME.   GARLIC PO Take 1 capsule by mouth daily.   glipiZIDE 10 MG tablet Commonly known as: GLUCOTROL TAKE 1 TABLET (10 MG TOTAL) BY MOUTH 2 (TWO) TIMES DAILY BEFORE A MEAL.   hydrochlorothiazide 25 MG tablet Commonly known as: HYDRODIURIL TAKE 1 TABLET BY MOUTH EVERY DAY   IRON PO Take 1 tablet by mouth daily.   metFORMIN 1000 MG tablet Commonly known as: GLUCOPHAGE TAKE 1 TABLET BY MOUTH TWICE DAILY WITH A MEAL  What changed: additional instructions   metoprolol succinate 100 MG 24 hr tablet Commonly known as: TOPROL-XL TAKE 1 TABLET BY MOUTH WITH OR IMMEDIATELY FOLLOWING A MEAL. What changed: See the new instructions.   simvastatin 10 MG tablet Commonly known as: ZOCOR TAKE 1 TABLET BY MOUTH EVERY DAY What changed:  how much to take how to take this when to take this additional  instructions   TURMERIC PO Take 1 capsule by mouth daily.   VISINE-A OP Place 1 drop into both eyes daily as needed (dry eyes).   VITAMIN C PO Take 1 tablet by mouth daily.   VITAMIN D PO Take 1 capsule by mouth daily.        Follow-up Information     [name], MD. Schedule an appointment as soon as possible for a visit in 1 week(s).   Specialty: Family Medicine Contact information: [place] [address] [phone]                No Known Allergies  Consultations: Surgery   Procedures/Studies: CT ABDOMEN PELVIS W CONTRAST  Result Date: 01/17/2021 CLINICAL DATA:  Abdominal distension with nausea and vomiting since yesterday. EXAM: CT ABDOMEN AND PELVIS WITH CONTRAST TECHNIQUE: Multidetector CT imaging of the abdomen and pelvis was performed using the standard protocol following bolus administration of intravenous contrast. CONTRAST:  40mL OMNIPAQUE IOHEXOL 350 MG/ML SOLN COMPARISON:  Noncontrast abdominopelvic CT 11/20/2019 FINDINGS: Lower chest: Elevated right hemidiaphragm with associated right basilar atelectasis. No significant pleural or pericardial effusion. LAD coronary artery calcification versus stent. Mild aortic atherosclerosis. Hepatobiliary: The liver is normal in density without suspicious focal abnormality. No evidence of gallstones, gallbladder wall thickening or biliary dilatation. Pancreas: Unremarkable. No pancreatic ductal dilatation or surrounding inflammatory changes. Spleen: Normal in size without focal abnormality. Adrenals/Urinary Tract: Both adrenal glands appear normal. The kidneys appear normal without evidence of urinary tract calculus, suspicious lesion or hydronephrosis. No bladder abnormalities are seen. Stomach/Bowel: A small amount of enteric contrast was administered. The stomach and duodenum are not significantly dilated, although there are multiple mildly to moderately dilated loops of jejunum and proximal  ileum in the central abdomen with associated air-levels. The distal small bowel is decompressed. Transition point appears to be in the left mid abdomen where there are multiple intraluminal lucencies within the small bowel (coronal image 37/4 and sagittal image 140/5), suspicious for an obstructing bezoar. Retrocecal appendix appears normal. There is stool throughout the colon which is normal in caliber. No colonic wall thickening or surrounding inflammation. Vascular/Lymphatic: There are no enlarged abdominal or pelvic lymph nodes. Mild aortic and branch vessel atherosclerosis without acute vascular findings. The portal, superior mesenteric and splenic veins are patent. Reproductive: The prostate gland and seminal vesicles appear normal. Other: Mild interloop fluid between the dilated loops of small bowel in the right anterior mid abdomen. No focal extraluminal fluid collection or free air. No evidence of abdominal wall hernia. Musculoskeletal: No acute or significant osseous findings. Multilevel lumbar spondylosis with a degenerative anterolisthesis at L4-5. IMPRESSION: 1. Mid small bowel obstruction with associated prominent intraluminal lucencies proximal to the level of transition in the left mid abdomen, suspicious for a possible obstructing bezoar. Mild associated interloop fluid without bowel wall thickening or evidence of perforation. 2. The appendix and colon appear normal. 3.  Aortic Atherosclerosis (ICD10-I70.0). Electronically Signed   By: Richardean Sale M.D.   On: 01/17/2021 17:00   DG Abd Portable 1V  Result Date: 01/19/2021 CLINICAL DATA:  Nasogastric tube placement EXAM: PORTABLE ABDOMEN - 1 VIEW COMPARISON:  Abdomen radiograph 01/19/2021 FINDINGS: The bottom of a nasogastric tube is visualized with distal port in the distal esophagus and side port also in the distal esophagus. Advance 14 cm for side port placement in the stomach. Scattered gas in indistinct loops of bowel. IMPRESSION: 1.  Nasogastric tube tip is in the distal esophagus. Consider advancing 14 cm. Electronically Signed   By: Van Clines M.D.   On: 01/19/2021 12:42   DG Abd Portable 1V  Result Date: 01/19/2021 CLINICAL DATA:  70 year old male with small bowel obstruction on CT Abdomen and Pelvis 2 days ago. EXAM: PORTABLE ABDOMEN - 1 VIEW COMPARISON:  01/18/2021 and earlier. FINDINGS: Oral contrast administered yesterday, with persistent dilated contrast fills small bowel loops 2337 hours. The oral contrast and small bowel dilatation persists. Contrast has not reached the colon. Excreted IV contrast in the urinary bladder again. Stable visualized osseous structures. IMPRESSION: Ongoing small bowel obstruction. Oral contrast continues to opacify dilated small bowel loops and has not reached the colon. Electronically Signed   By: Genevie Ann M.D.   On: 01/19/2021 07:58   DG Abd Portable 1V  Result Date: 01/18/2021 CLINICAL DATA:  8 hour post contrast, small-bowel protocol. EXAM: PORTABLE ABDOMEN - 1 VIEW COMPARISON:  Abdominal x-ray 01/18/2021, 4:06 a.m. CT abdomen and pelvis 01/17/2021. FINDINGS: Oral contrast is seen throughout dilated small bowel loops in the mid and upper abdomen. Small bowel loops measure up to 4.2 cm. No contrast is seen within distal small bowel or colon at this time. No suspicious calcifications. There are phleboliths in the left pelvis. No acute fractures. IMPRESSION: 1. Contrast is seen within dilated mid and proximal small bowel only. Findings are concerning for small-bowel obstruction. Electronically Signed   By: Ronney Asters M.D.   On: 01/18/2021 23:50   DG Abd Portable 1V-Small Bowel Obstruction Protocol-initial, 8 hr delay  Result Date: 01/18/2021 CLINICAL DATA:  70 year old male with small bowel obstruction by CT yesterday. EXAM: PORTABLE ABDOMEN - 1 VIEW COMPARISON:  CT Abdomen and Pelvis 01/17/2021. FINDINGS: AP views at 0405 hours. Bowel-gas pattern appears mildly improved since  yesterday, although the dilated small bowel loops were largely occult on the CT scout view. Other abdominal and pelvic visceral contours appear normal; partially visible excreted IV contrast in the urinary bladder. No acute osseous abnormality identified. Negative visible left lung base. IMPRESSION: Bowel-gas pattern appears improved since yesterday, although dilated small bowel loops were largely occult on the CT scout view. Electronically Signed   By: Genevie Ann M.D.   On: 01/18/2021 04:23      Subjective:  Patient seen and examined the bedside this morning.  Hemodynamically stable for discharge.  Discharge Exam: Vitals:   01/20/21 2112 01/21/21 0404  BP: (!) 160/84 137/73  Pulse: 95 84  Resp: 16   Temp: 98 F (36.7 C) 98.3 F (36.8 C)  SpO2: 95% 100%   Vitals:   01/20/21 0435 01/20/21 1321 01/20/21 2112 01/21/21 0404  BP: 121/68 137/67 (!) 160/84 137/73  Pulse: (!) 110 90 95 84  Resp: 18 20 16    Temp: 98.8 F (37.1 C) 98.7 F (37.1 C) 98 F (36.7 C) 98.3 F (36.8 C)  TempSrc: Oral Oral Oral Oral  SpO2: 94% 93% 95% 100%  Weight:      Height:        General: Pt is alert, awake, not in acute distress Cardiovascular: RRR, S1/S2 +, no rubs, no gallops Respiratory:  CTA bilaterally, no wheezing, no rhonchi Abdominal: Soft, NT, ND, bowel sounds + Extremities: no edema, no cyanosis    The results of significant diagnostics from this hospitalization (including imaging, microbiology, ancillary and laboratory) are listed below for reference.     Microbiology: Recent Results (from the past 240 hour(s))  Resp Panel by RT-PCR (Flu A&B, Covid) Nasopharyngeal Swab     Status: None   Collection Time: 01/18/21  3:56 AM   Specimen: Nasopharyngeal Swab; Nasopharyngeal(NP) swabs in vial transport medium  Result Value Ref Range Status   SARS Coronavirus 2 by RT PCR NEGATIVE NEGATIVE Final    Comment: (NOTE) SARS-CoV-2 target nucleic acids are NOT DETECTED.  The SARS-CoV-2 RNA is  generally detectable in upper respiratory specimens during the acute phase of infection. The lowest concentration of SARS-CoV-2 viral copies this assay can detect is 138 copies/mL. A negative result does not preclude SARS-Cov-2 infection and should not be used as the sole basis for treatment or other patient management decisions. A negative result may occur with  improper specimen collection/handling, submission of specimen other than nasopharyngeal swab, presence of viral mutation(s) within the areas targeted by this assay, and inadequate number of viral copies(<138 copies/mL). A negative result must be combined with clinical observations, patient history, and epidemiological information. The expected result is Negative.  Fact Sheet for Patients:  BloggerCourse.com  Fact Sheet for Healthcare Providers:  SeriousBroker.it  This test is no t yet approved or cleared by the [place] FDA and  has been authorized for detection and/or diagnosis of SARS-CoV-2 by FDA under an Emergency Use Authorization (EUA). This EUA will remain  in effect (meaning this test can be used) for the duration of the COVID-19 declaration under Section 564(b)(1) of the Act, 21 U.S.C.section 360bbb-3(b)(1), unless the authorization is terminated  or revoked sooner.       Influenza A by PCR NEGATIVE NEGATIVE Final   Influenza B by PCR NEGATIVE NEGATIVE Final    Comment: (NOTE) The Xpert Xpress SARS-CoV-2/FLU/RSV plus assay is intended as an aid in the diagnosis of influenza from Nasopharyngeal swab specimens and should not be used as a sole basis for treatment. Nasal washings and aspirates are unacceptable for Xpert Xpress SARS-CoV-2/FLU/RSV testing.  Fact Sheet for Patients: BloggerCourse.com  Fact Sheet for Healthcare Providers: SeriousBroker.it  This test is not yet approved or cleared by the [place] FDA and has been authorized for detection and/or diagnosis of SARS-CoV-2 by FDA under an Emergency Use Authorization (EUA). This EUA will remain in effect (meaning this test can be used) for the duration of the COVID-19 declaration under Section 564(b)(1) of the Act, 21 U.S.C. section 360bbb-3(b)(1), unless the authorization is terminated or revoked.  Performed at [facility], [address] [place], [place] [address]      Labs: BNP (last 3 results) No results for input(s): BNP in the last 8760 hours. Basic Metabolic Panel: Recent Labs  Lab 01/17/21 1109 01/18/21 0354 01/19/21 0512 01/20/21 0526 01/21/21 0536  NA 139 139 140 141 141  K 4.8 4.3 4.2 3.7 3.9  CL 102 103 101 106 106  CO2 29 29 26 23 29   GLUCOSE 286* 206* 284* 207* 176*  BUN 23 26* 29* 33* 24*  CREATININE 1.45* 1.51* 1.40* 1.65* 1.40*  CALCIUM 9.6 9.4 9.6 8.6* 8.4*  MG  --   --   --  1.6* 1.9   Liver Function Tests: Recent Labs  Lab 01/17/21 1109  AST 19  ALT  14  ALKPHOS 78  BILITOT 0.5  PROT 8.1  ALBUMIN 4.2   Recent Labs  Lab 01/17/21 1109  LIPASE 29   No results for input(s): AMMONIA in the last 168 hours. CBC: Recent Labs  Lab 01/17/21 1109 01/18/21 0354 01/20/21 0526  WBC 10.0 13.4* 7.9  HGB 12.3* 11.8* 11.6*  HCT 38.6* 36.8* 36.2*  MCV 97.7 97.1 98.1  PLT 280 268 231   Cardiac Enzymes: No results for input(s): CKTOTAL, CKMB, CKMBINDEX, TROPONINI in the last 168 hours. BNP: Invalid input(s): POCBNP CBG: Recent Labs  Lab 01/20/21 1711 01/20/21 2111 01/21/21 0054 01/21/21 0401 01/21/21 0712  GLUCAP 185* 209* 144* 149* 159*   D-Dimer No results for input(s): DDIMER in the last 72 hours. Hgb A1c No results for input(s): HGBA1C in the last 72 hours. Lipid Profile No results for input(s): CHOL, HDL, LDLCALC, TRIG, CHOLHDL, LDLDIRECT in the last 72 hours. Thyroid function studies No results for input(s): TSH, T4TOTAL, T3FREE, THYROIDAB in the  last 72 hours.  Invalid input(s): FREET3 Anemia work up No results for input(s): VITAMINB12, FOLATE, FERRITIN, TIBC, IRON, RETICCTPCT in the last 72 hours. Urinalysis    Component Value Date/Time   COLORURINE YELLOW 01/17/2021 [place] 01/17/2021 1038   LABSPEC 1.023 01/17/2021 1038   PHURINE 5.0 01/17/2021 1038   GLUCOSEU >=500 (A) 01/17/2021 1038   HGBUR NEGATIVE 01/17/2021 1038   HGBUR negative 06/21/2009 1012   BILIRUBINUR NEGATIVE 01/17/2021 1038   BILIRUBINUR n 01/25/2016 1311   KETONESUR 5 (A) 01/17/2021 1038   PROTEINUR NEGATIVE 01/17/2021 1038   UROBILINOGEN 1.0 09/26/2019 1514   NITRITE NEGATIVE 01/17/2021 1038   LEUKOCYTESUR NEGATIVE 01/17/2021 1038   Sepsis Labs Invalid input(s): PROCALCITONIN,  WBC,  LACTICIDVEN Microbiology Recent Results (from the past 240 hour(s))  Resp Panel by RT-PCR (Flu A&B, Covid) Nasopharyngeal Swab     Status: None   Collection Time: 01/18/21  3:56 AM   Specimen: Nasopharyngeal Swab; Nasopharyngeal(NP) swabs in vial transport medium  Result Value Ref Range Status   SARS Coronavirus 2 by RT PCR NEGATIVE NEGATIVE Final    Comment: (NOTE) SARS-CoV-2 target nucleic acids are NOT DETECTED.  The SARS-CoV-2 RNA is generally detectable in upper respiratory specimens during the acute phase of infection. The lowest concentration of SARS-CoV-2 viral copies this assay can detect is 138 copies/mL. A negative result does not preclude SARS-Cov-2 infection and should not be used as the sole basis for treatment or other patient management decisions. A negative result may occur with  improper specimen collection/handling, submission of specimen other than nasopharyngeal swab, presence of viral mutation(s) within the areas targeted by this assay, and inadequate number of viral copies(<138 copies/mL). A negative result must be combined with clinical observations, patient history, and epidemiological information. The expected result is  Negative.  Fact Sheet for Patients:  EntrepreneurPulse.com.au  Fact Sheet for Healthcare Providers:  IncredibleEmployment.be  This test is no t yet approved or cleared by the [place] FDA and  has been authorized for detection and/or diagnosis of SARS-CoV-2 by FDA under an Emergency Use Authorization (EUA). This EUA will remain  in effect (meaning this test can be used) for the duration of the COVID-19 declaration under Section 564(b)(1) of the Act, 21 U.S.C.section 360bbb-3(b)(1), unless the authorization is terminated  or revoked sooner.       Influenza A by PCR NEGATIVE NEGATIVE Final   Influenza B by PCR NEGATIVE NEGATIVE Final    Comment: (NOTE) The Xpert Xpress SARS-CoV-2/FLU/RSV  plus assay is intended as an aid in the diagnosis of influenza from Nasopharyngeal swab specimens and should not be used as a sole basis for treatment. Nasal washings and aspirates are unacceptable for Xpert Xpress SARS-CoV-2/FLU/RSV testing.  Fact Sheet for Patients: EntrepreneurPulse.com.au  Fact Sheet for Healthcare Providers: IncredibleEmployment.be  This test is not yet approved or cleared by the [place] FDA and has been authorized for detection and/or diagnosis of SARS-CoV-2 by FDA under an Emergency Use Authorization (EUA). This EUA will remain in effect (meaning this test can be used) for the duration of the COVID-19 declaration under Section 564(b)(1) of the Act, 21 U.S.C. section 360bbb-3(b)(1), unless the authorization is terminated or revoked.  Performed at [facility], [place] [address] [place], [place] [address]     Please note: You were cared for by a hospitalist during your hospital stay. Once you are discharged, your primary care physician will handle any further medical issues. Please note that NO REFILLS for any discharge medications will be authorized once you are  discharged, as it is imperative that you return to your primary care physician (or establish a relationship with a primary care physician if you do not have one) for your post hospital discharge needs so that they can reassess your need for medications and monitor your lab values.    Time coordinating discharge: 40 minutes  SIGNED:   Shelly Coss, MD  [facility] 01/21/2021, 10:36 AM Pager [phone]  If 7PM-7AM, please contact night-coverage [email or web address] Password [email or web address]

## 2021-01-21 NOTE — Progress Notes (Signed)
   Subjective/Chief Complaint: Pt doing well with soft PO   Objective: Vital signs in last 24 hours: Temp:  [98 F (36.7 C)-98.7 F (37.1 C)] 98.3 F (36.8 C) (11/19 0404) Pulse Rate:  [84-95] 84 (11/19 0404) Resp:  [16-20] 16 (11/18 2112) BP: (137-160)/(67-84) 137/73 (11/19 0404) SpO2:  [93 %-100 %] 100 % (11/19 0404) Last BM Date: 01/20/21  Intake/Output from previous day: No intake/output data recorded. Intake/Output this shift: Total I/O In: 2085 [I.V.:2085] Out: -   PE:  Constitutional: No acute distress, conversant, appears states age. Eyes: Anicteric sclerae, moist conjunctiva, no lid lag Lungs: Clear to auscultation bilaterally, normal respiratory effort CV: regular rate and rhythm, no murmurs, no peripheral edema, pedal pulses 2+ GI: Soft, no masses or hepatosplenomegaly, non-tender to palpation Skin: No rashes, palpation reveals normal turgor Psychiatric: appropriate judgment and insight, oriented to person, place, and time   Lab Results:  Recent Labs    01/20/21 0526  WBC 7.9  HGB 11.6*  HCT 36.2*  PLT 231   BMET Recent Labs    01/20/21 0526 01/21/21 0536  NA 141 141  K 3.7 3.9  CL 106 106  CO2 23 29  GLUCOSE 207* 176*  BUN 33* 24*  CREATININE 1.65* 1.40*  CALCIUM 8.6* 8.4*   PT/INR No results for input(s): LABPROT, INR in the last 72 hours. ABG No results for input(s): PHART, HCO3 in the last 72 hours.  Invalid input(s): PCO2, PO2  Studies/Results: DG Abd Portable 1V  Result Date: 01/19/2021 CLINICAL DATA:  Nasogastric tube placement EXAM: PORTABLE ABDOMEN - 1 VIEW COMPARISON:  Abdomen radiograph 01/19/2021 FINDINGS: The bottom of a nasogastric tube is visualized with distal port in the distal esophagus and side port also in the distal esophagus. Advance 14 cm for side port placement in the stomach. Scattered gas in indistinct loops of bowel. IMPRESSION: 1. Nasogastric tube tip is in the distal esophagus. Consider advancing 14 cm.  Electronically Signed   By: Gaylyn Rong M.D.   On: 01/19/2021 12:42    Anti-infectives: Anti-infectives (From admission, onward)    None       Assessment/Plan: SBO - Pt doing well and tol soft diet -Ok for home -no need for f/u with Surgery   LOS: 3 days    Axel Filler 01/21/2021

## 2021-01-24 ENCOUNTER — Telehealth: Payer: Self-pay

## 2021-01-24 NOTE — Telephone Encounter (Signed)
Look forward to seeing him for visit

## 2021-01-24 NOTE — Telephone Encounter (Signed)
Transition Care Management Follow-up Telephone Call Date of discharge and from where: 01/21/21 [place] How have you been since you were released from the hospital? Getting there Any questions or concerns? No  Items Reviewed: Did the pt receive and understand the discharge instructions provided? Yes  Medications obtained and verified? Yes  Other? No  Any new allergies since your discharge? No  Dietary orders reviewed? Yes Do you have support at home? Yes   Home Care and Equipment/Supplies: Were home health services ordered? not applicable If so, what is the name of the agency?   Has the agency set up a time to come to the patient's home? not applicable Were any new equipment or medical supplies ordered?  No What is the name of the medical supply agency?  Were you able to get the supplies/equipment? not applicable Do you have any questions related to the use of the equipment or supplies? No  Functional Questionnaire: (I = Independent and D = Dependent) ADLs: I  Bathing/Dressing- I  Meal Prep- I  Eating- I  Maintaining continence- I  Transferring/Ambulation- I  Managing Meds- I  Follow up appointments reviewed:  PCP Hospital f/u appt confirmed? No  Pt stated he will call for appt Specialist Hospital f/u appt confirmed? No   Are transportation arrangements needed? No  If their condition worsens, is the pt aware to call PCP or go to the Emergency Dept.? Yes Was the patient provided with contact information for the PCP's office or ED? Yes Was to pt encouraged to call back with questions or concerns? Yes

## 2021-02-03 ENCOUNTER — Encounter: Payer: Self-pay | Admitting: Family Medicine

## 2021-02-03 ENCOUNTER — Other Ambulatory Visit: Payer: Self-pay

## 2021-02-03 ENCOUNTER — Ambulatory Visit: Payer: Medicare PPO | Admitting: Family Medicine

## 2021-02-03 VITALS — BP 120/70 | HR 72 | Temp 98.6°F | Ht 76.0 in | Wt 223.1 lb

## 2021-02-03 DIAGNOSIS — I700 Atherosclerosis of aorta: Secondary | ICD-10-CM

## 2021-02-03 DIAGNOSIS — Z8719 Personal history of other diseases of the digestive system: Secondary | ICD-10-CM

## 2021-02-03 DIAGNOSIS — E785 Hyperlipidemia, unspecified: Secondary | ICD-10-CM | POA: Diagnosis not present

## 2021-02-03 DIAGNOSIS — R1013 Epigastric pain: Secondary | ICD-10-CM

## 2021-02-03 DIAGNOSIS — E1169 Type 2 diabetes mellitus with other specified complication: Secondary | ICD-10-CM

## 2021-02-03 DIAGNOSIS — I10 Essential (primary) hypertension: Secondary | ICD-10-CM | POA: Diagnosis not present

## 2021-02-03 DIAGNOSIS — E119 Type 2 diabetes mellitus without complications: Secondary | ICD-10-CM | POA: Diagnosis not present

## 2021-02-03 NOTE — Patient Instructions (Signed)
Thrilled you have recovered so well! Let us know if you have any issues recur before next visit.   You wanted to do bloodwork at next visit so we will hold of ffor now  Recommended follow up: keep visit mid december

## 2021-02-03 NOTE — Progress Notes (Signed)
Phone [phone] In person visit   Subjective:   Mark Hensen Sr. is a 70 y.o. year old very pleasant male patient who presents for/with See problem oriented charting Chief Complaint  Patient presents with   Hospitalization Follow-up    This visit occurred during the SARS-CoV-2 public health emergency.  Safety protocols were in place, including screening questions prior to the visit, additional usage of staff PPE, and extensive cleaning of exam room while observing appropriate contact time as indicated for disinfecting solutions.   Past Medical History-  Patient Active Problem List   Diagnosis Date Noted   Diabetes mellitus type II, controlled (HCC) 11/25/2006    Priority: High   Aortic atherosclerosis (HCC) 12/11/2019    Priority: Medium    Hyperlipidemia associated with type 2 diabetes mellitus (HCC) 02/21/2017    Priority: Medium    Essential hypertension 11/25/2006    Priority: Medium    History of small bowel obstruction 01/18/2021    Priority: Low   Bilateral primary osteoarthritis of knee 04/06/2020    Priority: Low   Right-sided low back pain with right-sided sciatica 01/01/2020    Priority: Low   Erectile dysfunction 09/01/2014    Priority: Low   Anemia, chronic disease 07/14/2009    Priority: Low    Medications- reviewed and updated Current Outpatient Medications  Medication Sig Dispense Refill   acetaminophen (TYLENOL) 500 MG tablet Take 1,000 mg by mouth every 6 (six) hours as needed for moderate pain.     Ascorbic Acid (VITAMIN C PO) Take 1 tablet by mouth daily.     b complex vitamins capsule Take 1 capsule by mouth daily.     CRANBERRY PO Take 1 capsule by mouth daily.     docusate sodium (COLACE) 100 MG capsule Take 1 capsule (100 mg total) by mouth daily as needed for up to 30 doses. 30 capsule 0   Ferrous Sulfate (IRON PO) Take 1 tablet by mouth daily.     gabapentin (NEURONTIN) 100 MG capsule TAKE 2 CAPSULES BY MOUTH AT BEDTIME. (Patient  taking differently: Take 200 mg by mouth at bedtime.) 180 capsule 0   GARLIC PO Take 1 capsule by mouth daily.     glipiZIDE (GLUCOTROL) 10 MG tablet TAKE 1 TABLET (10 MG TOTAL) BY MOUTH 2 (TWO) TIMES DAILY BEFORE A MEAL. 180 tablet 3   hydrochlorothiazide (HYDRODIURIL) 25 MG tablet TAKE 1 TABLET BY MOUTH EVERY DAY (Patient taking differently: Take 25 mg by mouth daily.) 90 tablet 0   metFORMIN (GLUCOPHAGE) 1000 MG tablet TAKE 1 TABLET BY MOUTH TWICE DAILY WITH A MEAL (Patient taking differently: Take 1,000 mg by mouth 2 (two) times daily with a meal. TAKE 1 TABLET BY MOUTH TWICE DAILY WITH A MEAL) 180 tablet 0   metoprolol succinate (TOPROL-XL) 100 MG 24 hr tablet TAKE 1 TABLET BY MOUTH WITH OR IMMEDIATELY FOLLOWING A MEAL. (Patient taking differently: Take 100 mg by mouth daily.) 90 tablet 0   Naphazoline-Pheniramine (VISINE-A OP) Place 1 drop into both eyes daily as needed (dry eyes).     simvastatin (ZOCOR) 10 MG tablet TAKE 1 TABLET BY MOUTH EVERY DAY (Patient taking differently: Take 10 mg by mouth at bedtime.) 90 tablet 3   TURMERIC PO Take 1 capsule by mouth daily.     VITAMIN D PO Take 1 capsule by mouth daily.     No current facility-administered medications for this visit.     Objective:  BP 120/70   Pulse 72  Temp 98.6 F (37 C) (Temporal)   Ht 6\' 4"  (1.93 m)   Wt 223 lb 2 oz (101.2 kg)   SpO2 98%   BMI 27.16 kg/m  Gen: NAD, resting comfortably CV: RRR no murmurs rubs or gallops Lungs: CTAB no crackles, wheeze, rhonchi Abdomen: soft/nontender/nondistended/normal bowel sounds. No rebound or guarding.  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  -CT  abdomen/pelvis showed SBO due to small bowel bezoar. General surgery also consulted and followed/helped with management -Several attempts for NG tube advancement failed. -Started on conservative management . SBO spontaneously resolved.  He had a bowel movement and tolerated a soft diet prior to discharge  Today patient reports-regular bowel movements daily since being home. No more pain.  Can still have some food sensitivity/mild stomach discomfort but nothing like he had previously A/P: Small bowel obstruction has resolved-discussed sometimes after hospitalization and instrumentation can take some time for stomach to settle back down-we will recheck in about 2 weeks on December 15 -Considered updating electrolytes but patient would like to hold off for now -Please note patient was set up for TCM visit but did not have TCM level needs  # Diabetes S: Medication:Glipizide 10 mg twice daily, Metformin 1000 mg twice daily Lab Results  Component Value Date   HGBA1C 7.0 (H) 10/14/2020   HGBA1C 6.7 (H) 02/12/2020   HGBA1C 6.1 (H) 10/15/2019  A/P:  Controlled. Continue current medications.  He wants to wait until next visit to check A1c  #hypertension S: medication: Hydrochlorothiazide 25 mg, metoprolol 100 mg 24-hour Off ramipril due to hyperkalemia August 2021 BP Readings from Last 3 Encounters:  02/03/21 120/70  01/21/21 137/73  10/10/20 130/70  A/P:  Controlled. Continue current medications.    #hyperlipidemia #aortic atherosclerosis S: Medication:Simvastatin 10 mg - tried to increase exercise Lab Results  Component Value Date   CHOL 132 10/14/2020   HDL 42 10/14/2020   LDLCALC 71 10/14/2020   LDLDIRECT 123.6 03/01/2008   TRIG 103 10/14/2020   CHOLHDL 3.1 10/14/2020   A/P: We discussed aortic atherosclerosis finding once again on CT scan during hospitalization.  LDL very close to ideal goal of 70 or less-we opted to continue current medication   Recommended follow up: No  follow-ups on file. Future Appointments  Date Time Provider Department Center  02/16/2021  3:40 PM 02/18/2021, MD [facility]  10/20/2021  3:15 PM [facility]    Lab/Order associations:   ICD-10-CM   1. History of small bowel obstruction  Z87.19     2. Epigastric pain  R10.13     3. Hyperlipidemia associated with type 2 diabetes mellitus (HCC)  E11.69    E78.5     4. Essential hypertension  I10     5. Aortic atherosclerosis (HCC)  I70.0     6. Controlled type 2 diabetes mellitus without complication, without long-term current use of insulin (HCC)  E11.9       No orders of the defined types were placed in this encounter.   I,Jada Bradford,acting as a scribe for 10/22/2021, MD.,have documented all relevant documentation on the behalf of Tana Conch, MD,as directed by  Tana Conch, MD while in the presence of Tana Conch, MD.  I, Tana Conch, MD, have reviewed all documentation for this visit. The documentation on 02/03/21 for the exam, diagnosis, procedures, and orders are all accurate and complete.  Return precautions advised.  Tana Conch, MD

## 2021-02-16 ENCOUNTER — Encounter: Payer: Self-pay | Admitting: Family Medicine

## 2021-02-16 ENCOUNTER — Ambulatory Visit (INDEPENDENT_AMBULATORY_CARE_PROVIDER_SITE_OTHER): Payer: Medicare PPO | Admitting: Family Medicine

## 2021-02-16 ENCOUNTER — Other Ambulatory Visit: Payer: Self-pay

## 2021-02-16 VITALS — BP 130/66 | HR 72 | Temp 97.4°F | Wt 222.0 lb

## 2021-02-16 DIAGNOSIS — E1169 Type 2 diabetes mellitus with other specified complication: Secondary | ICD-10-CM

## 2021-02-16 DIAGNOSIS — E785 Hyperlipidemia, unspecified: Secondary | ICD-10-CM

## 2021-02-16 DIAGNOSIS — Z0000 Encounter for general adult medical examination without abnormal findings: Secondary | ICD-10-CM | POA: Diagnosis not present

## 2021-02-16 DIAGNOSIS — I10 Essential (primary) hypertension: Secondary | ICD-10-CM | POA: Diagnosis not present

## 2021-02-16 DIAGNOSIS — E119 Type 2 diabetes mellitus without complications: Secondary | ICD-10-CM | POA: Diagnosis not present

## 2021-02-16 DIAGNOSIS — I700 Atherosclerosis of aorta: Secondary | ICD-10-CM | POA: Diagnosis not present

## 2021-02-16 DIAGNOSIS — N529 Male erectile dysfunction, unspecified: Secondary | ICD-10-CM

## 2021-02-16 DIAGNOSIS — R351 Nocturia: Secondary | ICD-10-CM | POA: Diagnosis not present

## 2021-02-16 NOTE — Progress Notes (Signed)
Phone: [phone]   Subjective:  Patient presents today for their annual physical. Chief complaint-noted.   See problem oriented charting- ROS- full  review of systems was completed and negative  except for: left ear fullness   The following were reviewed and entered/updated in epic: Past Medical History:  Diagnosis Date   Diabetes type 2, controlled (HCC)    Hyperlipidemia    Hypertension    UNSPECIFIED TACHYCARDIA 05/06/2007   Listed 05/06/2007- do not see recurrent tachycardia since that time    Patient Active Problem List   Diagnosis Date Noted   Diabetes mellitus type II, controlled (HCC) 11/25/2006    Priority: High   Aortic atherosclerosis (HCC) 12/11/2019    Priority: Medium    Hyperlipidemia associated with type 2 diabetes mellitus (HCC) 02/21/2017    Priority: Medium    Essential hypertension 11/25/2006    Priority: Medium    History of small bowel obstruction 01/18/2021    Priority: Low   Bilateral primary osteoarthritis of knee 04/06/2020    Priority: Low   Right-sided low back pain with right-sided sciatica 01/01/2020    Priority: Low   Erectile dysfunction 09/01/2014    Priority: Low   Anemia, chronic disease 07/14/2009    Priority: Low   Past Surgical History:  Procedure Laterality Date   CYSTOSCOPY WITH BIOPSY N/A 03/25/2020   Procedure: CYSTOSCOPY WITH PROSTATE UERTHRAL  BIOPSY/ TRANSURETHRAL RESECTION OF PROSTATE/ BLADDER BIOPSY/ BILATERAL RETROGRADE/ URETEROSCOPY;  Surgeon: Jannifer Hick, MD;  Location: [facility];  Service: Urology;  Laterality: N/A;   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 procedural complication   Diabetes Brother     Medications- reviewed and updated Current Outpatient Medications  Medication Sig Dispense Refill   acetaminophen (TYLENOL) 500 MG tablet Take 1,000 mg by mouth every 6 (six) hours as needed for moderate pain.     Ascorbic Acid (VITAMIN C PO) Take 1 tablet by mouth daily.     b complex vitamins capsule Take 1 capsule by mouth daily.     CRANBERRY PO Take 1 capsule by mouth daily.     docusate sodium (COLACE) 100 MG capsule Take 1 capsule (100 mg total) by mouth daily as needed for up to 30 doses. 30 capsule 0   Ferrous Sulfate (IRON PO) Take 1 tablet by mouth daily.     gabapentin (NEURONTIN) 100 MG capsule TAKE 2 CAPSULES BY MOUTH AT BEDTIME. (Patient taking differently: Take 200 mg by mouth at bedtime.) 180 capsule 0   GARLIC PO Take 1 capsule by mouth daily.     glipiZIDE (GLUCOTROL) 10 MG tablet TAKE 1 TABLET (10 MG TOTAL) BY MOUTH 2 (TWO) TIMES DAILY BEFORE A MEAL. 180 tablet 3   hydrochlorothiazide (HYDRODIURIL) 25 MG tablet TAKE 1 TABLET BY MOUTH EVERY DAY (Patient taking differently: Take 25 mg  by mouth daily.) 90 tablet 0   metFORMIN (GLUCOPHAGE) 1000 MG tablet TAKE 1 TABLET BY MOUTH TWICE DAILY WITH A MEAL (Patient taking differently: Take 1,000 mg by mouth 2 (two) times daily with a meal. TAKE 1 TABLET BY MOUTH TWICE DAILY WITH A MEAL) 180 tablet 0   metoprolol succinate (TOPROL-XL) 100 MG 24 hr tablet TAKE 1 TABLET BY MOUTH WITH OR IMMEDIATELY FOLLOWING A MEAL. (Patient taking differently: Take 100 mg by mouth daily.) 90 tablet 0   Naphazoline-Pheniramine (VISINE-A OP) Place 1 drop into both eyes daily as needed (dry eyes).     simvastatin (ZOCOR) 10 MG tablet TAKE 1 TABLET BY MOUTH EVERY DAY (Patient taking differently: Take 10 mg by mouth at bedtime.) 90 tablet 3   TURMERIC PO Take 1 capsule by mouth daily.     VITAMIN D PO Take 1 capsule by mouth daily.     No current facility-administered  medications for this visit.    Allergies-reviewed and updated No Known Allergies  Social History   Social History Narrative   Married 3 children. Wife and son patient here. 2 grandkids.       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    [employer] under[employer]   1978 [employer]- 4 degrees through 2000- [employer], math. 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    Objective  Objective:  BP 130/66 (BP Location: Left Arm, Patient Position: Sitting, Cuff Size: Normal)    Pulse 72    Temp (!) 97.4 F (36.3 C) (Oral)    Wt 222 lb (100.7 kg)    SpO2 97%    BMI 27.02 kg/m  Gen: NAD, resting comfortably HEENT: Mucous membranes are moist. Oropharynx normal.  Tympanic membrane normal on the right, obstructed by cerumen on the left-attempted irrigation but he did not tolerate fully-given information on mineral oil or Debrox Neck: no thyromegaly CV: RRR no murmurs rubs or gallops Lungs: CTAB no crackles, wheeze, rhonchi Abdomen: soft/nontender/nondistended/normal bowel sounds. No rebound or guarding.  Ext: no edema Skin: warm, dry Neuro: grossly normal, moves all extremities, PERRLA   Diabetic Foot Exam - Simple   Simple Foot Form Diabetic Foot exam was performed with the following findings: Yes 02/16/2021  4:05 PM  Visual Inspection See comments: Yes Sensation Testing Intact to touch and monofilament testing bilaterally: Yes Pulse Check Posterior Tibialis and Dorsalis pulse intact bilaterally: Yes Comments Several superficial  calluses noted (follows with podiatry)        Assessment and Plan  70 y.o. male presenting for annual physical.  Health Maintenance counseling: 1. Anticipatory guidance: Patient counseled regarding regular dental exams -q6 months, eye exams -yearly,  avoiding smoking and second hand smoke , limiting alcohol to 2 beverages per day -does not drink, no illicit drug use. 2. Risk factor reduction:  Advised patient of need for regular exercise and diet rich and fruits and vegetables to reduce risk of heart attack and stroke.  Exercise- goes to the gym regularly at least 2-3 days a week typically right now- had been daily prior to illness.  Diet/weight  management-weight down 5 pounds from last year.  Wt Readings from Last 3 Encounters:  02/16/21 222 lb (100.7 kg)  02/03/21 223 lb 2 oz (101.2 kg)  01/19/21 211 lb 11.2 oz (96 kg)  3. Immunizations/screenings/ancillary studies- discuss Shingrix-recommended at the pharmacy#2 - otherwise immunizations up-to-date. Immunization History  Administered Date(s) Administered   Fluad Quad(high Dose 65+) 10/31/2018, 11/05/2019   Influenza Split 03/05/2011   Influenza Whole 03/05/2005   Influenza, High Dose Seasonal PF 02/21/2017, 01/08/2018, 11/17/2020   Influenza,inj,Quad PF,6+ Mos 04/29/2014   Moderna Sars-Covid-2 Vaccination 04/16/2019, 05/14/2019, 12/10/2019, 08/10/2020   Pfizer Covid-19 Vaccine Bivalent Booster 21yrs & up 11/17/2020   Pneumococcal Conjugate-13 09/01/2014   Pneumococcal Polysaccharide-23 03/05/2006, 01/04/2015, 02/02/2016   Td 03/06/1999   Tdap 03/05/2011, 03/02/2013   Zoster Recombinat (Shingrix) 10/01/2020   Zoster, Live 09/03/2014  4. Prostate cancer screening- low risk prior PSA trend - update PSA with labs today Lab Results  Component Value Date   PSA 0.4 10/15/2019   PSA 0.4 10/15/2019   PSA 0.56 04/03/2018   5. Colon cancer screening - no colonoscopy on file- patient message from 10/16/2020 indicated Inetta Fermo would contact GI  and schedule a colonoscopy for patient- he was called on 02/10/21- gave # for him to call back  6. Skin cancer screening- lower risk due to melanin content. Advised regular sunscreen use. Denies worrisome, changing, or new skin lesions.  7. Smoking associated screening (lung cancer screening, AAA screen 65-75, UA)- never smoker 8. STD screening - opts out as monogamous and married  Status of chronic or acute concerns   #History of small bowel obstruction-see hospital follow-up from 12-22-patient reports continues to improve  # Diabetes S: Medication:Glipizide 10 mg twice daily, Metformin 1000 mg twice daily CBGs- has meter- no lows More aggressive on healthy eating- hoping improved plus working out more than last year Lab Results  Component Value Date   HGBA1C 7.0 (H) 10/14/2020   HGBA1C 6.7 (H) 02/12/2020   HGBA1C 6.1 (H) 10/15/2019  A/P: hopefully stable or improved- update a1c today. Continue current meds for now  #hypertension S: medication: hydrochlorothiazide 25 mg, metoprolol 100 mg 24-hour - Off ramipril due to hyperkalemia August 2021 BP Readings from Last 3 Encounters:  02/16/21 130/66  02/03/21 120/70  01/21/21 137/73  A/P: Controlled. Continue current medications.   #hyperlipidemia #aortic atherosclerosis S: Medication:Simvastatin 10 mg Lab Results  Component Value Date   CHOL 132 10/14/2020   HDL 42 10/14/2020   LDLCALC 71 10/14/2020   LDLDIRECT 123.6 03/01/2008   TRIG 103 10/14/2020   CHOLHDL 3.1 10/14/2020  A/P: LDL very close to goal on last check-continue to work on lifestyle changes  # erectile dysfunction-uses sildenafil as needed- doe snot need now- can refill later if needed    Recommended follow up: Return in about 6 months (around 08/17/2021) for follow up- or sooner if needed. Future Appointments  Date Time Provider Department Center  10/20/2021  3:15 PM [facility]   Lab/Order associations:NOT fasting   ICD-10-CM   1.  Preventative health care  Z00.00     2. Essential hypertension  I10 CBC with Differential/Platelet    Comprehensive metabolic panel    3. Hyperlipidemia associated with type 2 diabetes mellitus (HCC)  E11.69 CBC with Differential/Platelet   E78.5 Comprehensive metabolic panel    LDL cholesterol, direct    4. Controlled type 2 diabetes mellitus without complication, without long-term current use of insulin (HCC)  E11.9 Hemoglobin A1c    5.  Aortic atherosclerosis (HCC)  I70.0     6. Erectile dysfunction, unspecified erectile dysfunction type  N52.9     7. Nocturia  R35.1 PSA      No orders of the defined types were placed in this encounter.  I,Harris Phan,acting as a Neurosurgeon for Tana Conch, MD.,have documented all relevant documentation on the behalf of Tana Conch, MD,as directed by  Tana Conch, MD while in the presence of Tana Conch, MD.  I, Tana Conch, MD, have reviewed all documentation for this visit. The documentation on 02/16/21 for the exam, diagnosis, procedures, and orders are all accurate and complete.   Return precautions advised.  Tana Conch, MD

## 2021-02-16 NOTE — Patient Instructions (Addendum)
Health Maintenance Due  Topic Date Due   COLONOSCOPY   Please call [facility]  Address: [address] [place], [place], [place] [address] Phone: [phone]  Never done   Zoster Vaccines- Shingrix (2 of 2)  Please [place] know when you get #2   11/26/2020    Please stop by lab before you go If you have mychart- we will send your results within 3 business days of [place] receiving them.  If you do not have mychart- we will call you about results within 5 business days of [place] receiving them.  *please also note that you will see labs on mychart as soon as they post. I will later go in and write notes on them- will say "notes from Dr. Durene Cal"   Left ear irrigation after labs  If irrigation doesn't work or happens in future- debrox at home or... Mineral oil for ear full of wax Purchase mineral oil from laxative aisle Lay down on your side with ear that is bothering you facing up Use 3-4 drops with a dropper and place in ear for 30 seconds Place cotton swab outside of ear Turn to other side and allow this to drain Repeat 3-4 x a day Return to see [place] if not improving within a few days   Recommended follow up: Return in about 6 months (around 08/17/2021) for follow up- or sooner if needed.

## 2021-02-17 ENCOUNTER — Encounter: Payer: Self-pay | Admitting: Family Medicine

## 2021-02-17 ENCOUNTER — Other Ambulatory Visit: Payer: Self-pay | Admitting: Family Medicine

## 2021-02-17 ENCOUNTER — Telehealth: Payer: Self-pay

## 2021-02-17 DIAGNOSIS — E139 Other specified diabetes mellitus without complications: Secondary | ICD-10-CM

## 2021-02-17 LAB — COMPREHENSIVE METABOLIC PANEL
ALT: 11 U/L (ref 0–53)
AST: 15 U/L (ref 0–37)
Albumin: 4 g/dL (ref 3.5–5.2)
Alkaline Phosphatase: 71 U/L (ref 39–117)
BUN: 24 mg/dL — ABNORMAL HIGH (ref 6–23)
CO2: 29 mEq/L (ref 19–32)
Calcium: 9.6 mg/dL (ref 8.4–10.5)
Chloride: 106 mEq/L (ref 96–112)
Creatinine, Ser: 1.61 mg/dL — ABNORMAL HIGH (ref 0.40–1.50)
GFR: 42.61 mL/min — ABNORMAL LOW (ref >60.00)
Glucose, Bld: 41 mg/dL — CL (ref 70–99)
Potassium: 5 mEq/L (ref 3.5–5.1)
Sodium: 142 mEq/L (ref 135–145)
Total Bilirubin: 0.4 mg/dL (ref 0.2–1.2)
Total Protein: 6.9 g/dL (ref 6.0–8.3)

## 2021-02-17 LAB — HEMOGLOBIN A1C: Hgb A1c MFr Bld: 7.3 % — ABNORMAL HIGH (ref 4.6–6.5)

## 2021-02-17 LAB — CBC WITH DIFFERENTIAL/PLATELET
Basophils Absolute: 0.1 10*3/uL (ref 0.0–0.1)
Basophils Relative: 1.2 % (ref 0.0–3.0)
Eosinophils Absolute: 0.3 10*3/uL (ref 0.0–0.7)
Eosinophils Relative: 4.5 % (ref 0.0–5.0)
HCT: 34.7 % — ABNORMAL LOW (ref 39.0–52.0)
Hemoglobin: 11.1 g/dL — ABNORMAL LOW (ref 13.0–17.0)
Lymphocytes Relative: 28.8 % (ref 12.0–46.0)
Lymphs Abs: 1.7 10*3/uL (ref 0.7–4.0)
MCHC: 32 g/dL (ref 30.0–36.0)
MCV: 96.6 fl (ref 78.0–100.0)
Monocytes Absolute: 0.6 10*3/uL (ref 0.1–1.0)
Monocytes Relative: 10.1 % (ref 3.0–12.0)
Neutro Abs: 3.2 10*3/uL (ref 1.4–7.7)
Neutrophils Relative %: 55.4 % (ref 43.0–77.0)
Platelets: 236 10*3/uL (ref 150.0–400.0)
RBC: 3.59 Mil/uL — ABNORMAL LOW (ref 4.22–5.81)
RDW: 13.7 % (ref 11.5–15.5)
WBC: 5.8 10*3/uL (ref 4.0–10.5)

## 2021-02-17 LAB — PSA: PSA: 0.54 ng/mL (ref 0.10–4.00)

## 2021-02-17 LAB — LDL CHOLESTEROL, DIRECT: Direct LDL: 70 mg/dL

## 2021-02-17 NOTE — Telephone Encounter (Signed)
Called and left vm for pt to return call.

## 2021-02-17 NOTE — Telephone Encounter (Signed)
Si from [place] lab called with a critical glucose level of 41. Call taken at 1:25.

## 2021-02-17 NOTE — Telephone Encounter (Signed)
Please call and check on patient-have him recheck blood sugar. If he is really having lows like that we will need to hold the glipizide (or did it just happen with fasting)?

## 2021-02-20 NOTE — Telephone Encounter (Signed)
Patient returned phone call after hours on 02/17/21.

## 2021-02-21 ENCOUNTER — Other Ambulatory Visit: Payer: Self-pay | Admitting: *Deleted

## 2021-02-21 ENCOUNTER — Telehealth: Payer: Self-pay

## 2021-02-21 DIAGNOSIS — E119 Type 2 diabetes mellitus without complications: Secondary | ICD-10-CM

## 2021-02-21 MED ORDER — ACCU-CHEK SOFTCLIX LANCETS MISC: 5 refills | Status: DC

## 2021-02-21 MED ORDER — ACCU-CHEK GUIDE VI STRP: ORAL_STRIP | 5 refills | Status: DC

## 2021-02-21 MED ORDER — ACCU-CHEK GUIDE ME W/DEVICE KIT: PACK | 1 refills | Status: AC

## 2021-02-21 NOTE — Telephone Encounter (Signed)
Request received, Rx for meter, strips and lancets sent to the pharmacy. Patient notified and verbalized understanding.

## 2021-02-21 NOTE — Telephone Encounter (Signed)
See discussions in [place] messages/lab results

## 2021-02-21 NOTE — Telephone Encounter (Signed)
States pharmacy has sent a request for a new script for a glucose meter.  Patient would like to confirm that this was received.    Patient uses [facility] [address]

## 2021-03-21 ENCOUNTER — Other Ambulatory Visit: Payer: Self-pay

## 2021-03-21 ENCOUNTER — Ambulatory Visit: Payer: Self-pay

## 2021-03-21 ENCOUNTER — Telehealth: Payer: Self-pay | Admitting: Orthopaedic Surgery

## 2021-03-21 ENCOUNTER — Encounter: Payer: Self-pay | Admitting: Orthopaedic Surgery

## 2021-03-21 ENCOUNTER — Ambulatory Visit (INDEPENDENT_AMBULATORY_CARE_PROVIDER_SITE_OTHER): Payer: Medicare PPO | Admitting: Orthopaedic Surgery

## 2021-03-21 DIAGNOSIS — M25561 Pain in right knee: Secondary | ICD-10-CM | POA: Diagnosis not present

## 2021-03-21 DIAGNOSIS — M170 Bilateral primary osteoarthritis of knee: Secondary | ICD-10-CM | POA: Diagnosis not present

## 2021-03-21 DIAGNOSIS — G8929 Other chronic pain: Secondary | ICD-10-CM

## 2021-03-21 DIAGNOSIS — M25562 Pain in left knee: Secondary | ICD-10-CM | POA: Diagnosis not present

## 2021-03-21 NOTE — Telephone Encounter (Signed)
Received $25.00 cash, medical records release form and disability paperwork from the [place]. Patient said he will pick up the paperwork when it is ready from the front desk. Forwarding to [employer] today

## 2021-03-21 NOTE — Progress Notes (Signed)
Office Visit Note   Patient: Mark Steinhagen Sr.           Date of Birth: 1950/11/06           MRN: [id number] Visit Date: 03/21/2021              Requested by: Mark Olp, MD [address],  [place] [id number] PCP: Mark Olp, MD   Assessment & Plan: Visit Diagnoses:  1. Chronic pain of both knees   2. Bilateral primary osteoarthritis of knee     Plan: [name] came to the office today to discuss issues with the with both of his knees.  He has been previously diagnosed with osteoarthritis.  His last films were in 2021 demonstrating significant arthritis particularly in the right knee as opposed to the left with joint space narrowing and peripheral osteophytes and increased varus.  He had lesser findings on the left knee.  Over time he seems to have had more compromise of his activities.  He has had some trouble sleeping at night.  He has had some stiffness when he gets up from a sitting position and occasionally even has some swelling in his knees.  He notes he cannot run.  Has difficulty going up and down stairs.  He has been evaluated at the [facility] hospital for issues related to his service and was wondering whether or not there was a possibility that the problem with his knees was related to his service in [place] with the [employer].  Has a form that he like [place] to complete. He certainly has evidence of osteoarthritis of both knees.  Films demonstrate moderate to advanced arthritis on the right than the left knee.  Has had an old injury while in the service to the left leg when he had shrapnel removed from the anterior aspect of his left knee and possibly even within the joint.  There is still a small piece of metal on his x-ray.  Difficult to know whether or not it still in the joint.  He is not at the point where he needs to have his knees replaced but he certainly can try over-the-counter medicines.  He does wear a pullover knee supports particularly in the  winter.  He tries remain as active as he can he has been many years we have had a long discussion regarding his treatment options. He had extensive paperwork to be completed which we will try to perform here in the office.  Of also suggested he might want to check the orthopedic department at the [facility] hospital in [place] or even in [place].  There certainly is a possibility particularly in the left knee that this could be service-connected because of the shrapnel wounds but I do not have his operative notes from his time in [place].  We will try and complete the paperwork as best we can and is in much detail as we possibly can.  He he does have arthritis with occasional effusion and he does have some compromise of his activities but he does wear this bilateral knee supports.  At this point I do not think he is a candidate for knee replacement as he still remains fairly active but certainly that is an option over time and he is aware this  Follow-Up Instructions: Return if symptoms worsen or fail to improve.   Orders:  No orders of the defined types were placed in this encounter.  No orders of the defined types  were placed in this encounter.     Procedures: No procedures performed   Clinical Data: No additional findings.   Subjective: Chief Complaint  Patient presents with   Right Knee - Pain   Left Knee - Pain  Patient presents today for bilateral knee pain. He has seen Mark Ali in the past and received a cortisone injection in his left knee. He has paperwork that can be filled out to receive compensation from an injury he sustained in the [place] War.   HPI  Review of Systems   Objective: Vital Signs: There were no vitals taken for this visit.  Physical Exam Constitutional:      Appearance: He is well-developed.  Eyes:     Pupils: Pupils are equal, round, and reactive to light.  Pulmonary:     Effort: Pulmonary effort is normal.  Skin:    General: Skin is warm and  dry.  Neurological:     Mental Status: He is alert and oriented to person, place, and time.  Psychiatric:        Behavior: Behavior normal.    Ortho Exam left knee was not hot warm or red.  There is no effusion.  Considerable patella crepitation with flexion extension.  Range of motion is 0 to about 110 degrees.  No instability.  Slight increased varus.  No popliteal pain or mass.  No calf pain.  Motor exam intact.  Several scars along the distal anterior lateral thigh from shrapnel removal.  Right knee range of motion 0 to about 95 degrees without instability and increased varus.  Positive patella crepitation.  No calf pain.  Might have a small popliteal cyst but without pain.  Diffuse medial joint pain in both knees consistent with his arthritis.  Straight leg raise is negative.  Painless range of motion both hips  Specialty Comments:  No specialty comments available.  Imaging: No results found.   PMFS History: Patient Active Problem List   Diagnosis Date Noted   History of small bowel obstruction 01/18/2021   Bilateral primary osteoarthritis of knee 04/06/2020   Right-sided low back pain with right-sided sciatica 01/01/2020   Aortic atherosclerosis ([place]) 12/11/2019   Hyperlipidemia associated with type 2 diabetes mellitus ([place]) 02/21/2017   Erectile dysfunction 09/01/2014   Anemia, chronic disease 07/14/2009   Diabetes mellitus type II, controlled ([place]) 11/25/2006   Essential hypertension 11/25/2006   Past Medical History:  Diagnosis Date   Diabetes type 2, controlled ([place])    Hyperlipidemia    Hypertension    UNSPECIFIED TACHYCARDIA 05/06/2007   Listed 05/06/2007- do not see recurrent tachycardia since that time     Family History  Problem Relation Age of Onset   Diabetes Other    Kidney disease Other    Other Mother        respiratory failure died in 20s   Alcohol abuse Father    Lung disease Father        smoking   Heart disease Sister        heart attack 28    Alcohol abuse Brother    Cirrhosis Brother    Kidney disease Sister    COPD Sister        led to death. procedural complication   Diabetes Brother     Past Surgical History:  Procedure Laterality Date   CYSTOSCOPY WITH BIOPSY N/A 03/25/2020   Procedure: CYSTOSCOPY WITH PROSTATE UERTHRAL  BIOPSY/ TRANSURETHRAL RESECTION OF PROSTATE/ BLADDER BIOPSY/ BILATERAL RETROGRADE/ URETEROSCOPY;  Surgeon: Janith Lima,  MD;  Location: [facility];  Service: Urology;  Laterality: N/A;   left leg surgery      due to schrapnel    MULTIPLE TOOTH EXTRACTIONS     Social History   Occupational History   Not on file  Tobacco Use   Smoking status: Never   Smokeless tobacco: Never  Vaping Use   Vaping Use: Never used  Substance and Sexual Activity   Alcohol use: No   Drug use: No   Sexual activity: Yes

## 2021-03-28 ENCOUNTER — Telehealth: Payer: Self-pay | Admitting: Orthopaedic Surgery

## 2021-03-28 NOTE — Telephone Encounter (Signed)
Spoke with patient. Printed off his last office note and left for him up front. Also left some forms that he dropped off for him up front as well. He is going to come pick up those forms today and take them to the [place]. He wanted to know where he could purchase a cane at. I explained that that can be purchased at a medical supply store or pharmacy.

## 2021-03-28 NOTE — Telephone Encounter (Signed)
Pt called asking for a CB in regards to his last appt that was on 03/21/21. Pt states he has a couple a questions and about some senior products.   [phone]

## 2021-03-29 ENCOUNTER — Telehealth: Payer: Self-pay | Admitting: Orthopaedic Surgery

## 2021-03-29 NOTE — Telephone Encounter (Signed)
Pt called stating he spoke with Dr.Whitfield's nurse yesterday afternoon and was told that there was a issue with his paperwork and ciox wouldn't be able to fill it out. Pt states he was told he would be refunded the $25 fee since his forms weren't filled out and he would like a CB to confirm the refund is downstairs with the rest of the paperwork he's picking up.   [phone]

## 2021-04-14 DIAGNOSIS — M25562 Pain in left knee: Secondary | ICD-10-CM | POA: Diagnosis not present

## 2021-04-14 DIAGNOSIS — M25561 Pain in right knee: Secondary | ICD-10-CM | POA: Diagnosis not present

## 2021-04-21 ENCOUNTER — Telehealth: Payer: Self-pay | Admitting: Family Medicine

## 2021-04-21 DIAGNOSIS — N529 Male erectile dysfunction, unspecified: Secondary | ICD-10-CM

## 2021-04-21 NOTE — Telephone Encounter (Signed)
atient wants refills of both Viagra 100mg , and Viagra lose dose for daily use. Please call the patient at the number listed. Thank You!

## 2021-04-24 MED ORDER — SILDENAFIL CITRATE 100 MG PO TABS: ORAL_TABLET | ORAL | 5 refills | Status: DC

## 2021-04-24 MED ORDER — SILDENAFIL CITRATE 20 MG PO TABS: ORAL_TABLET | ORAL | 5 refills | Status: DC

## 2021-04-24 NOTE — Telephone Encounter (Signed)
Returned pt call and advised pt again that Dr. Durene Cal is out of the office until Tomorrow. Pt state he wanted to inform us that he was just here in December and does not need a follow up visit for this. I let pt know that any time a medication is not on the current med list we have to run it by the doctor first before restarting it. Pt voiced understanding.

## 2021-04-24 NOTE — Telephone Encounter (Signed)
Pt called wanting to speak with keba re medication - pr aware dr hunter is not in.- needs to talk to MA.

## 2021-04-24 NOTE — Telephone Encounter (Signed)
Viagra not on current med list, ok to refill?

## 2021-04-24 NOTE — Telephone Encounter (Signed)
Patient has called back in regard to this refill request.  I have advised patient that we do require 2 to 5 business days on all responses.  I have advised patient that he may have to have a follow up visit in regard.

## 2021-04-25 NOTE — Telephone Encounter (Signed)
Returned pt call and pt wanted me to know that for this particular medication for the future he would like it sent to [facility] at [address] due to it being cheaper there.

## 2021-04-25 NOTE — Telephone Encounter (Signed)
Called and spoke with pt and pt aware.  

## 2021-04-25 NOTE — Telephone Encounter (Signed)
Pt called back after done speaking with keba. Pt wants another call back at [phone].

## 2021-05-29 ENCOUNTER — Other Ambulatory Visit: Payer: Self-pay

## 2021-05-29 ENCOUNTER — Ambulatory Visit: Payer: Medicare PPO | Admitting: Podiatry

## 2021-05-29 DIAGNOSIS — L989 Disorder of the skin and subcutaneous tissue, unspecified: Secondary | ICD-10-CM

## 2021-05-29 DIAGNOSIS — M2042 Other hammer toe(s) (acquired), left foot: Secondary | ICD-10-CM

## 2021-05-29 DIAGNOSIS — M21612 Bunion of left foot: Secondary | ICD-10-CM

## 2021-05-29 NOTE — Progress Notes (Signed)
? ?  Subjective: ?71 year old male with PMHx of T2DM presenting today for follow up evaluation of painful callus lesions noted to the bilateral feet.  Patient wears the silicone toe cushions which help significantly reduce friction and pain.  He presents today for routine care ? ?Past Medical History:  ?Diagnosis Date  ? Diabetes type 2, controlled ([place])   ? Hyperlipidemia   ? Hypertension   ? UNSPECIFIED TACHYCARDIA 05/06/2007  ? Listed 05/06/2007- do not see recurrent tachycardia since that time   ? ?Objective:  ?Physical Exam ?General: Alert and oriented x3 in no acute distress ? ?Dermatology: Hyperkeratotic lesions present on the bilateral feet. Pain on palpation with a central nucleated core noted.  Skin is warm, dry and supple bilateral lower extremities. Negative for open lesions or macerations. ? ?Vascular: Palpable pedal pulses bilaterally. No edema or erythema noted. Capillary refill within normal limits. ? ?Neurological: Epicritic and protective threshold diminished bilaterally.  ? ?Musculoskeletal Exam: Pain on palpation at the keratotic lesion noted. Range of motion within normal limits bilateral. Muscle strength 5/5 in all groups bilateral. ?Clinical evidence of bunion deformity noted to the respective foot. There is moderate pain on palpation range of motion of the first MPJ. Lateral deviation of the hallux noted consistent with hallux abductovalgus. ?Hammertoe contracture also noted on clinical exam to the 2nd toe of the left foot. Symptomatic pain on palpation and range of motion also noted to the metatarsal phalangeal joints of the respective hammertoe digits.   ? 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 ?Plan of Care:  ?#1 Patient evaluated ?#2 Continue conservative treatment at this time.  ?#3 continue silicone toe caps ?#4 Recommended good shoe gear  that does not constrict the toebox area.  ?#5  Excisional debridement of the hyperkeratotic preulcerative callus tissue was performed using a tissue nipper and 312 scalpel without incident or bleeding.  The patient felt relief. ?6.  Advised against going barefoot.  Continue wearing good supportive shoes and sneakers ?7.  Return to clinic as needed ? ? ?Edrick Kins, DPM ?[place] ? ?Dr. Edrick Kins, DPM  ?  ?[address]                                      ?[place], [place] [address]                ?Office [phone]  ?Fax [phone] ? ? ? ? ? ?

## 2021-06-12 ENCOUNTER — Telehealth: Payer: Self-pay | Admitting: Family Medicine

## 2021-06-12 NOTE — Telephone Encounter (Signed)
He can try OTC claritin or zyrtec. ? ?Katina Degree. Jimmey Ralph, MD ?06/12/2021 2:31 PM  ? ?

## 2021-06-12 NOTE — Telephone Encounter (Signed)
Pt believes he has developed allergies and is asking for advice on what Dr Durene Cal would recommend for OTC medication. Please advise ?

## 2021-06-12 NOTE — Telephone Encounter (Signed)
Patient notified can use OTC Claritin or Zyrtec  ?Verbalized understanding  ? ?

## 2021-06-12 NOTE — Telephone Encounter (Signed)
Ok for pt to take any OTC allergy meds? ?

## 2021-06-28 ENCOUNTER — Other Ambulatory Visit: Payer: Self-pay | Admitting: Family Medicine

## 2021-06-29 ENCOUNTER — Other Ambulatory Visit: Payer: Self-pay | Admitting: Family Medicine

## 2021-06-29 DIAGNOSIS — E139 Other specified diabetes mellitus without complications: Secondary | ICD-10-CM

## 2021-07-25 NOTE — Progress Notes (Unsigned)
[place] Phone: [phone] Subjective:   Fontaine No, am serving as a scribe for Dr. Hulan Saas.  I'm seeing this patient by the request  of:  [name], MD  CC: Left elbow pain follow-up  TIR:[id number]  Kiing Deakin Sr. is a 71 y.o. male coming in with complaint of L elbow pain. Last seen August 2022. Patient states that he hits his elbow often patient does have a 8 memory after hitting it fairly significantly 1 time.  States that it was severe enough that it may be swelled at that time but did not note.  Denies any significant decrease in range of motion.  Able to do daily activities but wife told him it was more swollen and should be seen.  Denies any fevers chills, denies any radiation of pain, any weakness       Past Medical History:  Diagnosis Date   Diabetes type 2, controlled ([place])    Hyperlipidemia    Hypertension    UNSPECIFIED TACHYCARDIA 05/06/2007   Listed 05/06/2007- do not see recurrent tachycardia since that time    Past Surgical History:  Procedure Laterality Date   CYSTOSCOPY WITH BIOPSY N/A 03/25/2020   Procedure: CYSTOSCOPY WITH PROSTATE UERTHRAL  BIOPSY/ TRANSURETHRAL RESECTION OF PROSTATE/ BLADDER BIOPSY/ BILATERAL RETROGRADE/ URETEROSCOPY;  Surgeon: Janith Lima, MD;  Location: [facility];  Service: Urology;  Laterality: N/A;   left leg surgery      due to schrapnel    MULTIPLE TOOTH EXTRACTIONS     Social History   Socioeconomic History   Marital status: Married    Spouse name: Not on file   Number of children: Not on file   Years of education: Not on file   Highest education level: Not on file  Occupational History   Not on file  Tobacco Use   Smoking status: Never   Smokeless tobacco: Never  Vaping Use   Vaping Use: Never used  Substance and Sexual Activity   Alcohol use: No   Drug use: No   Sexual activity: Yes  Other Topics Concern   Not on file   Social History Narrative   Married 3 children. Wife and son patient here. 2 grandkids.       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    [employer] undergrad   1978 [employer]- 4 degrees through 2000- [employer], math. 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       [place]- pretty involved there- deacon, helps with education.    Social Determinants of Health   Financial Resource Strain: Low Risk    Difficulty of Paying Living Expenses: Not hard at all  Food Insecurity: No Food Insecurity   Worried About Charity fundraiser in the Last Year: Never true   [place] in the Last Year: Never true  Transportation Needs: No Transportation Needs   Lack of Transportation (Medical): No   Lack of Transportation (Non-Medical): No  Physical Activity: Sufficiently Active   Days of Exercise per Week: 5 days   Minutes of Exercise per Session: 50  min  Stress: No Stress Concern Present   Feeling of Stress : Not at all  Social Connections: Socially Integrated   Frequency of Communication with Friends and Family: More than three times a week   Frequency of Social Gatherings with Friends and Family: More than three times a week   Attends Religious Services: More than 4 times per year   Active Member of [employer] or Organizations: Yes   Attends Archivist Meetings: 1 to 4 times per year   Marital Status: Married   No Known Allergies Family History  Problem Relation Age of Onset   Diabetes Other    Kidney disease Other    Other Mother        respiratory failure died in 59s   Alcohol abuse  Father    Lung disease Father        smoking   Heart disease Sister        heart attack 37   Alcohol abuse Brother    Cirrhosis Brother    Kidney disease Sister    COPD Sister        led to death. procedural complication   Diabetes Brother     Current Outpatient Medications (Endocrine & Metabolic):    glipiZIDE (GLUCOTROL) 10 MG tablet, TAKE 1 TABLET (10 MG TOTAL) BY MOUTH 2 (TWO) TIMES DAILY BEFORE A MEAL.   metFORMIN (GLUCOPHAGE) 1000 MG tablet, TAKE 1 TABLET BY MOUTH TWICE DAILY WITH A MEAL  Current Outpatient Medications (Cardiovascular):    hydrochlorothiazide (HYDRODIURIL) 25 MG tablet, TAKE 1 TABLET BY MOUTH EVERY DAY   metoprolol succinate (TOPROL-XL) 100 MG 24 hr tablet, TAKE 1 TABLET BY MOUTH WITH OR IMMEDIATELY FOLLOWING A MEAL.   sildenafil (REVATIO) 20 MG tablet, Take 1-5 tablets as needed.   sildenafil (VIAGRA) 100 MG tablet, Take 1/2 to 1 tablet as needed for erectile dysfunction up to every 48 hours   simvastatin (ZOCOR) 10 MG tablet, TAKE 1 TABLET BY MOUTH EVERY DAY (Patient taking differently: Take 10 mg by mouth at bedtime.)   Current Outpatient Medications (Analgesics):    acetaminophen (TYLENOL) 500 MG tablet, Take 1,000 mg by mouth every 6 (six) hours as needed for moderate pain.  Current Outpatient Medications (Hematological):    Ferrous Sulfate (IRON PO), Take 1 tablet by mouth daily.  Current Outpatient Medications (Other):    Accu-Chek Softclix Lancets lancets, Use as instructed   Ascorbic Acid (VITAMIN C PO), Take 1 tablet by mouth daily.   b complex vitamins capsule, Take 1 capsule by mouth daily.   Blood Glucose Monitoring Suppl (ACCU-CHEK GUIDE ME) w/Device KIT, Use as directed   CRANBERRY PO, Take 1 capsule by mouth daily.   docusate sodium (COLACE) 100 MG capsule, Take 1 capsule (100 mg total) by mouth daily as needed for up to 30 doses.   doxycycline (VIBRA-TABS) 100 MG tablet, Take 1 tablet (100 mg total) by mouth 2 (two) times daily.    gabapentin (NEURONTIN) 100 MG capsule, TAKE 2 CAPSULES BY MOUTH AT BEDTIME   GARLIC PO, Take 1 capsule by mouth daily.   glucose blood (ACCU-CHEK GUIDE) test strip, Use as instructed   Naphazoline-Pheniramine (VISINE-A OP), Place 1 drop into both eyes daily as needed (dry eyes).   TURMERIC PO, Take 1 capsule by mouth daily.   VITAMIN D PO, Take 1 capsule by mouth daily.   Reviewed prior external information including notes and imaging from  primary care provider As well as notes that were available from  care everywhere and other healthcare systems.  Past medical history, social, surgical and family history all reviewed in electronic medical record.  No pertanent information unless stated regarding to the chief complaint.   Review of Systems:  No headache, visual changes, nausea, vomiting, diarrhea, constipation, dizziness, abdominal pain, skin rash, fevers, chills, night sweats, weight loss, swollen lymph nodes, body aches, joint swelling, chest pain, shortness of breath, mood changes. POSITIVE muscle aches  Objective  Blood pressure 124/76, pulse (!) 58, height '6\' 4"'  (1.93 m), weight 228 lb (103.4 kg), SpO2 98 %.   General: No apparent distress alert and oriented x3 mood and affect normal, dressed appropriately.  HEENT: Pupils equal, extraocular movements intact  Respiratory: Patient's speak in full sentences and does not appear short of breath  Cardiovascular: No lower extremity edema, non tender, no erythema  Gait normal with good balance and coordination.  MSK: Patient's left elbow does have significant swelling of the olecranon area.  Full range of motion noted.  Patient does have fluctuance noted of the olecranon bursa.  No erythema noted.  Minorly warm to touch.  Limited muscular skeletal ultrasound was performed and interpreted by Hulan Saas, M  Limited ultrasound shows the patient does have effusion of the bursa noted.  Due to the heterotropic changes noted of the fluid  though consistent with more of a hemorrhagic versus synovial fluid.  Patient does have what appears to be a calcific loose body noted as well. Impression: Hemorrhagic olecranon bursitis with questionable avulsion fracture  Procedure: Real-time Ultrasound Guided aspiration and injection of olecranon bursa Device: GE Logiq Q7 Ultrasound guided injection is preferred based studies that show increased duration, increased effect, greater accuracy, decreased procedural pain, increased response rate, and decreased cost with ultrasound guided versus blind injection.  Verbal informed consent obtained.  Time-out conducted.  Noted no overlying erythema, induration, or other signs of local infection.  Skin prepped in a sterile fashion.  Local anesthesia: Topical Ethyl chloride.  With sterile technique and under real time ultrasound guidance: With a 18-gauge 1 inch needle patient was injected with 0.5 cc of 0.5% Marcaine and aspirated 35 cc of frank blood.  0.5 cc of Kenalog 40 mg/mL and injected. Completed without difficulty  Pain immediately resolved suggesting accurate placement of the medication.  Advised to call if fevers/chills, erythema, induration, drainage, or persistent bleeding.  Impression: Technically successful ultrasound guided injection.    Impression and Recommendations:     The above documentation has been reviewed and is accurate and complete Lyndal Pulley, DO

## 2021-07-26 ENCOUNTER — Ambulatory Visit: Payer: Medicare PPO | Admitting: Family Medicine

## 2021-07-27 ENCOUNTER — Ambulatory Visit: Payer: Medicare PPO | Admitting: Family Medicine

## 2021-07-27 ENCOUNTER — Ambulatory Visit (INDEPENDENT_AMBULATORY_CARE_PROVIDER_SITE_OTHER): Payer: Medicare PPO

## 2021-07-27 ENCOUNTER — Ambulatory Visit: Payer: Self-pay

## 2021-07-27 VITALS — BP 124/76 | HR 58 | Ht 76.0 in | Wt 228.0 lb

## 2021-07-27 DIAGNOSIS — M25522 Pain in left elbow: Secondary | ICD-10-CM | POA: Diagnosis not present

## 2021-07-27 DIAGNOSIS — M7022 Olecranon bursitis, left elbow: Secondary | ICD-10-CM

## 2021-07-27 DIAGNOSIS — M7989 Other specified soft tissue disorders: Secondary | ICD-10-CM | POA: Diagnosis not present

## 2021-07-27 MED ORDER — DOXYCYCLINE HYCLATE 100 MG PO TABS: 100.0000 mg | ORAL_TABLET | Freq: Two times a day (BID) | ORAL | 0 refills | Status: DC

## 2021-07-27 NOTE — Patient Instructions (Addendum)
Compression sleeve daily for one week See me in 2 months

## 2021-07-27 NOTE — Assessment & Plan Note (Signed)
Aspiration today.  Patient does have what appears to be a potential avulsion fracture noted but no significant pain.  We discussed the reaccumulation of this hemorrhagic bursitis is potentially possible.  Patient is not on any blood thinners.  Discussed compression.  Given doxycycline just in case with patient having this procedure done on a holiday weekend.  Patient will be following up with me again in 6 to 8 weeks or sooner if any significant difficulty.

## 2021-08-30 ENCOUNTER — Telehealth: Payer: Self-pay | Admitting: Family Medicine

## 2021-08-30 NOTE — Telephone Encounter (Signed)
Patient called asking if Val or Morgen could call the patient to discuss further.  Please advise.

## 2021-08-30 NOTE — Telephone Encounter (Signed)
Pt left voicemail afterhours. Elbow is better, wearing brace at all times but still experiencing pain and "awareness of tendon". Looking for advise.

## 2021-09-01 ENCOUNTER — Telehealth: Payer: Self-pay

## 2021-09-01 NOTE — Telephone Encounter (Signed)
Spoke with patient per verbal from Dr. Katrinka Blazing. Patient voices understanding and will f/u with [place] at appt on August 11th.

## 2021-09-01 NOTE — Telephone Encounter (Signed)
Left patient message to call back for recommendations. Dr. Katrinka Blazing provided a verbal that elbow will still be sore for about 3 months due to bone healing. Can try Voltaren or Arnica lotion to that area for pain relief and try to not lean elbow on anything without padding beneath joint.

## 2021-09-01 NOTE — Telephone Encounter (Signed)
Spoke with patient. He has been wearing brace. Elbow hurts if he hits it on anything. Denies any redness or swelling. Wants to know what to use for his pain.

## 2021-09-27 ENCOUNTER — Other Ambulatory Visit: Payer: Self-pay | Admitting: Family Medicine

## 2021-09-28 ENCOUNTER — Other Ambulatory Visit: Payer: Self-pay | Admitting: Family Medicine

## 2021-09-28 DIAGNOSIS — E139 Other specified diabetes mellitus without complications: Secondary | ICD-10-CM
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
## 2021-10-13 ENCOUNTER — Telehealth: Payer: Self-pay | Admitting: Family Medicine

## 2021-10-13 ENCOUNTER — Ambulatory Visit: Payer: Self-pay

## 2021-10-13 ENCOUNTER — Ambulatory Visit: Payer: Medicare PPO | Admitting: Family Medicine

## 2021-10-13 VITALS — BP 126/74 | HR 57 | Ht 76.0 in | Wt 221.0 lb

## 2021-10-13 DIAGNOSIS — M25522 Pain in left elbow: Secondary | ICD-10-CM

## 2021-10-13 DIAGNOSIS — M170 Bilateral primary osteoarthritis of knee: Secondary | ICD-10-CM | POA: Diagnosis not present

## 2021-10-13 DIAGNOSIS — M7022 Olecranon bursitis, left elbow: Secondary | ICD-10-CM | POA: Diagnosis not present

## 2021-10-13 NOTE — Assessment & Plan Note (Signed)
Significant improvement at this time.  Nothing that I see is significantly concerning.  No reaccumulation.  Patient can start wearing the compression as frequently.  Follow-up with me more on an as-needed basis.
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
## 2021-10-13 NOTE — Patient Instructions (Signed)
Good to see you! Think about different injections and brace of the knee

## 2021-10-13 NOTE — Telephone Encounter (Signed)
Spoke with patient he req CB 11/2021

## 2021-10-15 ENCOUNTER — Encounter: Payer: Self-pay | Admitting: Family Medicine

## 2021-10-17 ENCOUNTER — Encounter: Payer: Self-pay | Admitting: Family Medicine

## 2021-10-17 NOTE — Telephone Encounter (Signed)
Pt called to speak with Dr. Katrinka Blazing or his assistant about this as he is unable to find what he is looking for.

## 2021-10-20 ENCOUNTER — Ambulatory Visit: Payer: Medicare PPO

## 2021-10-25 ENCOUNTER — Other Ambulatory Visit: Payer: Self-pay

## 2021-10-25 ENCOUNTER — Other Ambulatory Visit: Payer: Self-pay | Admitting: Family Medicine

## 2021-10-31 ENCOUNTER — Ambulatory Visit: Payer: Medicare PPO | Admitting: Family Medicine

## 2021-11-02 ENCOUNTER — Encounter: Payer: Self-pay | Admitting: Family Medicine

## 2021-11-02 ENCOUNTER — Ambulatory Visit: Payer: Medicare PPO | Admitting: Family Medicine

## 2021-11-02 VITALS — BP 128/78 | HR 58 | Temp 98.6°F | Ht 76.0 in | Wt 222.6 lb

## 2021-11-02 DIAGNOSIS — I10 Essential (primary) hypertension: Secondary | ICD-10-CM | POA: Diagnosis not present

## 2021-11-02 DIAGNOSIS — E1169 Type 2 diabetes mellitus with other specified complication: Secondary | ICD-10-CM | POA: Diagnosis not present

## 2021-11-02 DIAGNOSIS — I700 Atherosclerosis of aorta: Secondary | ICD-10-CM | POA: Diagnosis not present

## 2021-11-02 DIAGNOSIS — E785 Hyperlipidemia, unspecified: Secondary | ICD-10-CM

## 2021-11-02 DIAGNOSIS — R739 Hyperglycemia, unspecified: Secondary | ICD-10-CM | POA: Diagnosis not present

## 2021-11-02 DIAGNOSIS — E119 Type 2 diabetes mellitus without complications: Secondary | ICD-10-CM

## 2021-11-02 DIAGNOSIS — H6122 Impacted cerumen, left ear: Secondary | ICD-10-CM

## 2021-11-02 DIAGNOSIS — E1165 Type 2 diabetes mellitus with hyperglycemia: Secondary | ICD-10-CM

## 2021-11-02 DIAGNOSIS — Z1211 Encounter for screening for malignant neoplasm of colon: Secondary | ICD-10-CM

## 2021-11-02 NOTE — Progress Notes (Signed)
Phone [phone] In person visit   Subjective:   Mark Battershell Sr. is a 71 y.o. year old very pleasant male patient who presents for/with See problem oriented charting Chief Complaint  Patient presents with   left ear popping    Pt c/o left ear popping that does not resolve once it has popped he denies pain and he is using ear drops.   Past Medical History-  Patient Active Problem List   Diagnosis Date Noted   Diabetes mellitus type II, controlled ([place]) 11/25/2006    Priority: High   Aortic atherosclerosis ([place]) 12/11/2019    Priority: Medium    Hyperlipidemia associated with type 2 diabetes mellitus ([place]) 02/21/2017    Priority: Medium    Essential hypertension 11/25/2006    Priority: Medium    History of small bowel obstruction 01/18/2021    Priority: Low   Bilateral primary osteoarthritis of knee 04/06/2020    Priority: Low   Right-sided low back pain with right-sided sciatica 01/01/2020    Priority: Low   Erectile dysfunction 09/01/2014    Priority: Low   Anemia, chronic disease 07/14/2009    Priority: Low   Olecranon bursitis of left elbow 07/27/2021    Medications- reviewed and updated Current Outpatient Medications  Medication Sig Dispense Refill   Accu-Chek Softclix Lancets lancets Use as instructed 100 each 5   acetaminophen (TYLENOL) 500 MG tablet Take 1,000 mg by mouth every 6 (six) hours as needed for moderate pain.     Ascorbic Acid (VITAMIN C PO) Take 1 tablet by mouth daily.     b complex vitamins capsule Take 1 capsule by mouth daily.     Blood Glucose Monitoring Suppl (ACCU-CHEK GUIDE ME) w/Device KIT Use as directed 1 kit 1   CRANBERRY PO Take 1 capsule by mouth daily.     docusate sodium (COLACE) 100 MG capsule Take 1 capsule (100 mg total) by mouth daily as needed for up to 30 doses. 30 capsule 0   Ferrous Sulfate (IRON PO) Take 1 tablet by mouth daily.     gabapentin (NEURONTIN) 100 MG capsule TAKE 2 CAPSULES BY MOUTH AT BEDTIME 180  capsule 0   GARLIC PO Take 1 capsule by mouth daily.     glipiZIDE (GLUCOTROL) 10 MG tablet TAKE 1 TABLET (10 MG TOTAL) BY MOUTH 2 (TWO) TIMES DAILY BEFORE A MEAL. 180 tablet 3   glucose blood (ACCU-CHEK GUIDE) test strip Use as instructed 100 each 5   hydrochlorothiazide (HYDRODIURIL) 25 MG tablet TAKE 1 TABLET BY MOUTH EVERY DAY 90 tablet 0   metFORMIN (GLUCOPHAGE) 1000 MG tablet TAKE 1 TABLET BY MOUTH TWICE DAILY WITH A MEAL 180 tablet 0   metoprolol succinate (TOPROL-XL) 100 MG 24 hr tablet TAKE 1 TABLET BY MOUTH WITH OR IMMEDIATELY FOLLOWING A MEAL. 90 tablet 0   Naphazoline-Pheniramine (VISINE-A OP) Place 1 drop into both eyes daily as needed (dry eyes).     sildenafil (REVATIO) 20 MG tablet Take 1-5 tablets as needed. 45 tablet 5   sildenafil (VIAGRA) 100 MG tablet Take 1/2 to 1 tablet as needed for erectile dysfunction up to every 48 hours 10 tablet 5   simvastatin (ZOCOR) 10 MG tablet TAKE 1 TABLET BY MOUTH EVERY DAY (Patient taking differently: Take 10 mg by mouth at bedtime.) 90 tablet 3   TURMERIC PO Take 1 capsule by mouth daily.     VITAMIN D PO Take 1 capsule by mouth daily.     No current facility-administered  medications for this visit.     Objective:  BP 128/78   Pulse (!) 58   Temp 98.6 F (37 C)   Ht _0  (1.93 m)   Wt 222 lb 9.6 oz (101 kg)   SpO2 98%   BMI 27.10 kg/m  Gen: NAD, resting comfortably Right TM normal, left TM completely obstructed by cerumen- there did appear to be a small window at the top of this but could not see clearly to TM- and after irrigation- could not see window     Assessment and Plan   # Left ear popping S: Patient complains of left ear popping for a few weeks. Clogged sensatoin.  Even after the popping sensation will come back-symptoms do not resolve and has recurrent issues.  No pain.  He has tried over-the-counter eardrops- ear wax drops. Slightly muffled hearing  A/P: unable to remove cerumen impaction with irrigation or  currete  From avs "We will call you within two weeks about your referral to ear, nose and throat doctors (ENT). If you do not hear within 2 weeks, give [place] a call. I am also willing to do a recheck after you do debrox twice daily over the weekend if not improving- attempt irrigation again as well as curette"   # Diabetes S: Medication:Glipizide 10 mg twice daily, Metformin 1000 mg twice daily CBGs- no low blood sugars lately Lab Results  Component Value Date   HGBA1C 7.3 (H) 02/16/2021   HGBA1C 7.0 (H) 10/14/2020   HGBA1C 6.7 (H) 02/12/2020  A/P: hopefully improved- update a1c today. Continue current meds for now- if above goal- perhaps drop glipizide to 5 mg twice daily with prior low- and add jardiance 10 mg  #hypertension S: medication: Hydrochlorothiazide 25 mg, metoprolol 100 mg 24-hour - Off ramipril due to hyperkalemia August 2021 BP Readings from Last 3 Encounters:  11/02/21 128/78  10/13/21 126/74  07/27/21 124/76  A/P: Controlled. Continue current medications.   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  Lipids approximately right at goal  #HM- refer to GI again- last referral just over a year- there was a note of him being called and I encouraged follow up at December 2022 visit- gave # to call  Recommended follow up: Return for next already scheduled visit or sooner if needed. Future Appointments  Date Time Provider [place]  12/08/2021  3:00 PM Lyndal Pulley, DO [facility] None  02/13/2022  1:20 PM Yong Channel Brayton Mars, MD [facility]    Lab/Order  associations:   ICD-10-CM   1. Essential hypertension  I10     2. Controlled type 2 diabetes mellitus without complication, without long-term current use of insulin (HCC)  E11.9 CBC with Differential/Platelet    Comprehensive metabolic panel    Hemoglobin A1c    Microalbumin / creatinine urine ratio    3. Aortic atherosclerosis (HCC)  I70.0     4. Hyperlipidemia associated with type 2 diabetes mellitus ([place])  E11.69    E78.5     5. Hyperglycemia  R73.9 Vitamin B12    6. Screen for colon cancer  Z12.11 Ambulatory referral to Gastroenterology      No orders of the defined types were placed in this encounter.   Return precautions advised.  Garret Reddish, MD

## 2021-11-02 NOTE — Patient Instructions (Addendum)
Flu shot- we should have these available within a month or two but please let us know if you get at outside pharmacy (high dose)   GI contact Please call to schedule visit and/or procedure Address: [address] [place], [place], [place] [address] Phone: [phone]  Call and schedule your eye exam and have it sent to [place] at [phone].  We will call you within two weeks about your referral to ear, nose and throat doctors (ENT). If you do not hear within 2 weeks, give [place] a call. I am also willing to do a recheck after you do debrox  (or [facility] brand you have) twice daily over the weekend if not improving- attempt irrigation again as well as curette  Please stop by lab before you go If you have mychart- we will send your results within 3 business days of [place] receiving them.  If you do not have mychart- we will call you about results within 5 business days of [place] receiving them.  *please also note that you will see labs on mychart as soon as they post. I will later go in and write notes on them- will say "notes from Dr. Durene Cal"   Recommended follow up: Return for next already scheduled visit or sooner if needed.

## 2021-11-03 ENCOUNTER — Encounter: Payer: Self-pay | Admitting: Family Medicine

## 2021-11-03 LAB — COMPREHENSIVE METABOLIC PANEL
ALT: 17 U/L (ref 0–53)
AST: 22 U/L (ref 0–37)
Albumin: 4.1 g/dL (ref 3.5–5.2)
Alkaline Phosphatase: 56 U/L (ref 39–117)
BUN: 22 mg/dL (ref 6–23)
CO2: 28 mEq/L (ref 19–32)
Calcium: 9.9 mg/dL (ref 8.4–10.5)
Chloride: 106 mEq/L (ref 96–112)
Creatinine, Ser: 1.5 mg/dL (ref 0.40–1.50)
GFR: 46.15 mL/min — ABNORMAL LOW (ref >60.00)
Glucose, Bld: 51 mg/dL — ABNORMAL LOW (ref 70–99)
Potassium: 5.8 mEq/L — ABNORMAL HIGH (ref 3.5–5.1)
Sodium: 141 mEq/L (ref 135–145)
Total Bilirubin: 0.3 mg/dL (ref 0.2–1.2)
Total Protein: 7.4 g/dL (ref 6.0–8.3)

## 2021-11-03 LAB — CBC WITH DIFFERENTIAL/PLATELET
Basophils Absolute: 0.1 10*3/uL (ref 0.0–0.1)
Basophils Relative: 1.3 % (ref 0.0–3.0)
Eosinophils Absolute: 0.2 10*3/uL (ref 0.0–0.7)
Eosinophils Relative: 3 % (ref 0.0–5.0)
HCT: 34.7 % — ABNORMAL LOW (ref 39.0–52.0)
Hemoglobin: 11.5 g/dL — ABNORMAL LOW (ref 13.0–17.0)
Lymphocytes Relative: 24.9 % (ref 12.0–46.0)
Lymphs Abs: 1.3 10*3/uL (ref 0.7–4.0)
MCHC: 33.1 g/dL (ref 30.0–36.0)
MCV: 97.1 fl (ref 78.0–100.0)
Monocytes Absolute: 0.5 10*3/uL (ref 0.1–1.0)
Monocytes Relative: 9.1 % (ref 3.0–12.0)
Neutro Abs: 3.3 10*3/uL (ref 1.4–7.7)
Neutrophils Relative %: 61.7 % (ref 43.0–77.0)
Platelets: 235 10*3/uL (ref 150.0–400.0)
RBC: 3.58 Mil/uL — ABNORMAL LOW (ref 4.22–5.81)
RDW: 13.5 % (ref 11.5–15.5)
WBC: 5.3 10*3/uL (ref 4.0–10.5)

## 2021-11-03 LAB — VITAMIN B12: Vitamin B-12: 329 pg/mL (ref 211–911)

## 2021-11-03 LAB — HEMOGLOBIN A1C: Hgb A1c MFr Bld: 7.1 % — ABNORMAL HIGH (ref 4.6–6.5)

## 2021-11-07 ENCOUNTER — Other Ambulatory Visit: Payer: Self-pay

## 2021-11-07 MED ORDER — EMPAGLIFLOZIN 10 MG PO TABS: 10.0000 mg | ORAL_TABLET | Freq: Every day | ORAL | 5 refills | Status: DC

## 2021-11-13 ENCOUNTER — Telehealth: Payer: Self-pay | Admitting: Family Medicine

## 2021-11-13 NOTE — Telephone Encounter (Signed)
Copied from CRM [id number]. Topic: Medicare AWV >> Nov 13, 2021 10:50 AM Payton Doughty wrote: Reason for CRM: Left message for patient to schedule Annual Wellness Visit.  Please schedule with Nurse Health Advisor Lanier Ensign, RN at [facility]. This appt can be telephone or office visit. Please call [phone] ask for [facility]

## 2021-11-14 ENCOUNTER — Other Ambulatory Visit: Payer: Self-pay | Admitting: Family Medicine

## 2021-11-28 DIAGNOSIS — H6122 Impacted cerumen, left ear: Secondary | ICD-10-CM | POA: Diagnosis not present

## 2021-11-28 DIAGNOSIS — Z77122 Contact with and (suspected) exposure to noise: Secondary | ICD-10-CM | POA: Diagnosis not present

## 2021-12-07 NOTE — Progress Notes (Signed)
[place] [facility] Phone: [phone] Subjective:   Fontaine No, am serving as a scribe for Dr. Hulan Saas.  I'm seeing this patient by the request  of:  [name], MD  CC: left elbow pain knee pain   [id number]  10/13/2021 Discussed with patient at great length.  We discussed that x-rays of previously did show the patient did have significant arthritic changes of the right knee greater than left.  Made sense from patient's pain.  Seem to be mostly in the medial compartment. 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  We did discuss possible different injections.  Such as corticosteroids, viscosupplementation and PRP.  We also discussed with patient having some instability of the knee about custom bracing.  Patient does have an abnormal thigh to calf ratio and instability noted but I do feel that a medial unloader brace would be the most beneficial.  Patient is going to follow-up with the [facility] and see what they possibly discuss for suggest.  Patient knows we are here if he would like to start with any of these treatment options.  Significant improvement at this time.  Nothing that I see is significantly concerning.  No reaccumulation.  Patient can start wearing the compression as frequently.  Follow-up with me more on an as-needed basis.  Updated 12/08/2021 Torrie Mayers Sr. is a 71 y.o. male coming in with complaint of elbow and knee pain. Patient states that he continues to have sensitivity over L elbow when he places elbow or hits it on something hard. Continues to wear elbow brace.   Knee pain is unchanged. Going to [place] in November. Had to cancel previous appointment.        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 Wife and son patient here. 2 grandkids.       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    [employer] undergrad   1978 [employer]- 4 degrees through 2000- [employer], math. 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    Social Determinants of Health   Financial Resource Strain: Low Risk  (10/14/2020)   Overall Financial Resource Strain (CARDIA)    Difficulty of Paying Living Expenses: Not hard at all  Food Insecurity: No Food Insecurity (10/14/2020)    Hunger Vital Sign    Worried About Running Out of Food in the Last Year: Never true    Ran Out of Food in the Last Year: Never true  Transportation Needs: No Transportation Needs (10/14/2020)   PRAPARE - Hydrologist (Medical): No    Lack of Transportation (Non-Medical): No  Physical Activity: Sufficiently Active (10/14/2020)   Exercise Vital Sign    Days of Exercise per Week: 5 days    Minutes of Exercise per Session: 50 min  Stress: No Stress Concern Present (10/14/2020)   [place]    Feeling of Stress : Not at all  Social Connections: [place] (10/14/2020)   Social Connection and Isolation Panel [NHANES]    Frequency of Communication with Friends and Family: More than three times a week    Frequency of Social Gatherings with Friends and Family: More than three times a week    Attends Religious Services: More than 4 times per year    Active Member of [employer] or Organizations: Yes    Attends Archivist Meetings: 1 to 4 times per year    Marital Status: Married   No Known Allergies Family History  Problem Relation Age of Onset   Diabetes Other    Kidney disease Other    Other Mother        respiratory failure died in 35s   Alcohol abuse Father    Lung disease Father        smoking   Heart disease Sister        heart attack 31   Alcohol abuse Brother    Cirrhosis Brother    Kidney disease Sister    COPD Sister        led to death. procedural complication   Diabetes Brother     Current Outpatient Medications (Endocrine & Metabolic):    empagliflozin (JARDIANCE) 10 MG TABS tablet, Take 1 tablet (10 mg total) by mouth daily before breakfast.   metFORMIN (GLUCOPHAGE) 1000 MG tablet, TAKE 1 TABLET BY MOUTH TWICE DAILY WITH A MEAL  Current Outpatient Medications (Cardiovascular):    hydrochlorothiazide (HYDRODIURIL) 25 MG tablet, TAKE 1 TABLET BY MOUTH EVERY DAY    metoprolol succinate (TOPROL-XL) 100 MG 24 hr tablet, TAKE 1 TABLET BY MOUTH WITH OR IMMEDIATELY FOLLOWING A MEAL.   sildenafil (REVATIO) 20 MG tablet, Take 1-5 tablets as needed.   sildenafil (VIAGRA) 100 MG tablet, Take 1/2 to 1 tablet as needed for erectile dysfunction up to every 48 hours   simvastatin (ZOCOR) 10 MG tablet, TAKE 1 TABLET BY MOUTH EVERY DAY   Current Outpatient Medications (Analgesics):    acetaminophen (TYLENOL) 500 MG tablet, Take 1,000 mg by mouth every 6 (six) hours as needed for moderate pain.  Current Outpatient Medications (Hematological):    Ferrous Sulfate (IRON PO),  Take 1 tablet by mouth daily.  Current Outpatient Medications (Other):    Accu-Chek Softclix Lancets lancets, Use as instructed   Ascorbic Acid (VITAMIN C PO), Take 1 tablet by mouth daily.   b complex vitamins capsule, Take 1 capsule by mouth daily.   Blood Glucose Monitoring Suppl (ACCU-CHEK GUIDE ME) w/Device KIT, Use as directed   CRANBERRY PO, Take 1 capsule by mouth daily.   docusate sodium (COLACE) 100 MG capsule, Take 1 capsule (100 mg total) by mouth daily as needed for up to 30 doses.   gabapentin (NEURONTIN) 100 MG capsule, TAKE 2 CAPSULES BY MOUTH AT BEDTIME   GARLIC PO, Take 1 capsule by mouth daily.   glucose blood (ACCU-CHEK GUIDE) test strip, Use as instructed   Naphazoline-Pheniramine (VISINE-A OP), Place 1 drop into both eyes daily as needed (dry eyes).   TURMERIC PO, Take 1 capsule by mouth daily.   VITAMIN D PO, Take 1 capsule by mouth daily.   Reviewed prior external information including notes and imaging from  primary care provider As well as notes that were available from care everywhere and other healthcare systems.  Past medical history, social, surgical and family history all reviewed in electronic medical record.  No pertanent information unless stated regarding to the chief complaint.   Review of Systems:  No headache, visual changes, nausea, vomiting, diarrhea,  constipation, dizziness, abdominal pain, skin rash, fevers, chills, night sweats, weight loss, swollen lymph nodes, body aches, joint swelling, chest pain, shortness of breath, mood changes. POSITIVE muscle aches  Objective  Blood pressure 118/76, pulse (!) 58, height _0  (1.93 m), weight 213 lb (96.6 kg), SpO2 98 %.   General: No apparent distress alert and oriented x3 mood and affect normal, dressed appropriately.  HEENT: Pupils equal, extraocular movements intact  Respiratory: Patient's speak in full sentences and does not appear short of breath  Cardiovascular: No lower extremity edema, non tender, no erythema  Antalgic gait noted. Left elbow shows that patient does have granulation tissue at the area of where patient did have the injection.  Otherwise no sign of any infectious etiology.  Good range of motion lacking last 5 degrees of extension of the elbow. Limited muscular skeletal ultrasound was performed and interpreted by Hulan Saas, M   Limited ultrasound still shows a narrowing of the elbow space noted.  Patient does not have any hypoechoic changes in the olecranon area.  Still some very mild calcific changes noted at the tricep tendon. Impression: Interval no change in stable     Impression and Recommendations:     The above documentation has been reviewed and is accurate and complete Lyndal Pulley, DO

## 2021-12-08 ENCOUNTER — Encounter: Payer: Self-pay | Admitting: Family Medicine

## 2021-12-08 ENCOUNTER — Ambulatory Visit: Payer: Self-pay

## 2021-12-08 ENCOUNTER — Ambulatory Visit: Payer: Medicare PPO | Admitting: Family Medicine

## 2021-12-08 VITALS — BP 118/76 | HR 58 | Ht 76.0 in | Wt 213.0 lb

## 2021-12-08 DIAGNOSIS — G8929 Other chronic pain: Secondary | ICD-10-CM | POA: Diagnosis not present

## 2021-12-08 DIAGNOSIS — M25561 Pain in right knee: Secondary | ICD-10-CM | POA: Diagnosis not present

## 2021-12-08 DIAGNOSIS — M170 Bilateral primary osteoarthritis of knee: Secondary | ICD-10-CM

## 2021-12-08 DIAGNOSIS — M7022 Olecranon bursitis, left elbow: Secondary | ICD-10-CM

## 2021-12-08 DIAGNOSIS — M25562 Pain in left knee: Secondary | ICD-10-CM | POA: Diagnosis not present

## 2021-12-08 NOTE — Assessment & Plan Note (Signed)
No reaccumulation again noted at this time.  Patient does have some of the chronic arthritic changes that we will need to continue to monitor as well.  Patient's skin seems to be healing just fine.

## 2021-12-08 NOTE — Assessment & Plan Note (Signed)
as stated from last time  Discussed with patient at great length. We discussed that x-rays of previously did show the patient did have significant arthritic changes of the right knee greater than left. Made sense from patient's pain. Seem to be mostly in the medial compartment. 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 Such as corticosteroids, viscosupplementation and PRP. We also discussed with patient having some instability of the knee about custom bracing. Patient does have an abnormal thigh to calf ratio and instability noted but I do feel that a medial unloader brace would be the most beneficial. Patient is going to follow-up with the [facility] and see what they possibly discuss for suggest. Patient knows we are here if he would like to start with any of these treatment options.

## 2021-12-08 NOTE — Patient Instructions (Signed)
Good to see you! Glad everything seems to be doing well If the elbow gives you more trouble you can see me in 2 months No follow up necessary at this time Can transition to a band-aid

## 2021-12-21 ENCOUNTER — Telehealth: Payer: Self-pay | Admitting: Family Medicine

## 2021-12-21 NOTE — Telephone Encounter (Signed)
Copied from [place] [phone]. Topic: Medicare AWV >> Dec 21, 2021 11:16 AM Devoria Glassing wrote: Reason for CRM: Left message for patient to schedule Annual Wellness Visit.  Please schedule with Nurse Health Advisor Charlott Rakes, RN at [facility]. This appt can be telephone or office visit. Please call [phone] ask for [facility]

## 2021-12-24 ENCOUNTER — Other Ambulatory Visit: Payer: Self-pay | Admitting: Family Medicine

## 2021-12-25 ENCOUNTER — Other Ambulatory Visit: Payer: Self-pay | Admitting: Family Medicine

## 2021-12-25 ENCOUNTER — Ambulatory Visit (INDEPENDENT_AMBULATORY_CARE_PROVIDER_SITE_OTHER): Payer: Medicare PPO

## 2021-12-25 VITALS — Wt 213.0 lb

## 2021-12-25 DIAGNOSIS — Z0000 Encounter for general adult medical examination without abnormal findings: Secondary | ICD-10-CM | POA: Diagnosis not present

## 2021-12-25 DIAGNOSIS — E139 Other specified diabetes mellitus without complications: Secondary | ICD-10-CM

## 2021-12-25 NOTE — Patient Instructions (Signed)
Mark Ali , Thank you for taking time to come for your Medicare Wellness Visit. I appreciate your ongoing commitment to your health goals. Please review the following plan we discussed and let me know if I can assist you in the future.   These are the goals we discussed:  Goals      DIET - INCREASE WATER INTAKE     Exercise 150 min/wk Moderate Activity     Patient Stated     Continue back on gym schedule     Patient Stated     Maintain health and activity         This is a list of the screening recommended for you and due dates:  Health Maintenance  Topic Date Due   Colon Cancer Screening  Never done   Eye exam for diabetics  06/18/2021   Yearly kidney health urinalysis for diabetes  10/14/2021   Complete foot exam   02/16/2022   Hemoglobin A1C  05/03/2022   Yearly kidney function blood test for diabetes  11/03/2022   Tetanus Vaccine  03/03/2023   Pneumonia Vaccine  Completed   Flu Shot  Completed   COVID-19 Vaccine  Completed   Hepatitis C Screening: USPSTF Recommendation to screen - Ages 18-79 yo.  Completed   Zoster (Shingles) Vaccine  Completed   HPV Vaccine  Aged Out    Advanced directives: Please bring a copy of your health care power of attorney and living will to the office at your convenience.  Conditions/risks identified: maintain health and activity   Next appointment: Follow up in one year for your annual wellness visit.   Preventive Care 19 Years and Older, Male  Preventive care refers to lifestyle choices and visits with your health care provider that can promote health and wellness. What does preventive care include? A yearly physical exam. This is also called an annual well check. Dental exams once or twice a year. Routine eye exams. Ask your health care provider how often you should have your eyes checked. Personal lifestyle choices, including: Daily care of your teeth and gums. Regular physical activity. Eating a healthy diet. Avoiding tobacco  and drug use. Limiting alcohol use. Practicing safe sex. Taking low doses of aspirin every day. Taking vitamin and mineral supplements as recommended by your health care provider. What happens during an annual well check? The services and screenings done by your health care provider during your annual well check will depend on your age, overall health, lifestyle risk factors, and family history of disease. Counseling  Your health care provider may ask you questions about your: Alcohol use. Tobacco use. Drug use. Emotional well-being. Home and relationship well-being. Sexual activity. Eating habits. History of falls. Memory and ability to understand (cognition). Work and work Statistician. Screening  You may have the following tests or measurements: Height, weight, and BMI. Blood pressure. Lipid and cholesterol levels. These may be checked every 5 years, or more frequently if you are over 70 years old. Skin check. Lung cancer screening. You may have this screening every year starting at age 29 if you have a 30-pack-year history of smoking and currently smoke or have quit within the past 15 years. Fecal occult blood test (FOBT) of the stool. You may have this test every year starting at age 30. Flexible sigmoidoscopy or colonoscopy. You may have a sigmoidoscopy every 5 years or a colonoscopy every 10 years starting at age 5. Prostate cancer screening. Recommendations will vary depending on your family history and  other risks. Hepatitis C blood test. Hepatitis B blood test. Sexually transmitted disease (STD) testing. Diabetes screening. This is done by checking your blood sugar (glucose) after you have not eaten for a while (fasting). You may have this done every 1-3 years. Abdominal aortic aneurysm (AAA) screening. You may need this if you are a current or former smoker. Osteoporosis. You may be screened starting at age 58 if you are at high risk. Talk with your health care provider  about your test results, treatment options, and if necessary, the need for more tests. Vaccines  Your health care provider may recommend certain vaccines, such as: Influenza vaccine. This is recommended every year. Tetanus, diphtheria, and acellular pertussis (Tdap, Td) vaccine. You may need a Td booster every 10 years. Zoster vaccine. You may need this after age 86. Pneumococcal 13-valent conjugate (PCV13) vaccine. One dose is recommended after age 56. Pneumococcal polysaccharide (PPSV23) vaccine. One dose is recommended after age 63. Talk to your health care provider about which screenings and vaccines you need and how often you need them. This information is not intended to replace advice given to you by your health care provider. Make sure you discuss any questions you have with your health care provider. Document Released: 03/18/2015 Document Revised: 11/09/2015 Document Reviewed: 12/21/2014 Elsevier Interactive Patient Education  2017 [place] Prevention in the Home Falls can cause injuries. They can happen to people of all ages. There are many things you can do to make your home safe and to help prevent falls. What can I do on the outside of my home? Regularly fix the edges of walkways and driveways and fix any cracks. Remove anything that might make you trip as you walk through a door, such as a raised step or threshold. Trim any bushes or trees on the path to your home. Use bright outdoor lighting. Clear any walking paths of anything that might make someone trip, such as rocks or tools. Regularly check to see if handrails are loose or broken. Make sure that both sides of any steps have handrails. Any raised decks and porches should have guardrails on the edges. Have any leaves, snow, or ice cleared regularly. Use sand or salt on walking paths during winter. Clean up any spills in your garage right away. This includes oil or grease spills. What can I do in the  bathroom? Use night lights. Install grab bars by the toilet and in the tub and shower. Do not use towel bars as grab bars. Use non-skid mats or decals in the tub or shower. If you need to sit down in the shower, use a plastic, non-slip stool. Keep the floor dry. Clean up any water that spills on the floor as soon as it happens. Remove soap buildup in the tub or shower regularly. Attach bath mats securely with double-sided non-slip rug tape. Do not have throw rugs and other things on the floor that can make you trip. What can I do in the bedroom? Use night lights. Make sure that you have a light by your bed that is easy to reach. Do not use any sheets or blankets that are too big for your bed. They should not hang down onto the floor. Have a firm chair that has side arms. You can use this for support while you get dressed. Do not have throw rugs and other things on the floor that can make you trip. What can I do in the kitchen? Clean up any spills right  away. Avoid walking on wet floors. Keep items that you use a lot in easy-to-reach places. If you need to reach something above you, use a strong step stool that has a grab bar. Keep electrical cords out of the way. Do not use floor polish or wax that makes floors slippery. If you must use wax, use non-skid floor wax. Do not have throw rugs and other things on the floor that can make you trip. What can I do with my stairs? Do not leave any items on the stairs. Make sure that there are handrails on both sides of the stairs and use them. Fix handrails that are broken or loose. Make sure that handrails are as long as the stairways. Check any carpeting to make sure that it is firmly attached to the stairs. Fix any carpet that is loose or worn. Avoid having throw rugs at the top or bottom of the stairs. If you do have throw rugs, attach them to the floor with carpet tape. Make sure that you have a light switch at the top of the stairs and the  bottom of the stairs. If you do not have them, ask someone to add them for you. What else can I do to help prevent falls? Wear shoes that: Do not have high heels. Have rubber bottoms. Are comfortable and fit you well. Are closed at the toe. Do not wear sandals. If you use a stepladder: Make sure that it is fully opened. Do not climb a closed stepladder. Make sure that both sides of the stepladder are locked into place. Ask someone to hold it for you, if possible. Clearly mark and make sure that you can see: Any grab bars or handrails. First and last steps. Where the edge of each step is. Use tools that help you move around (mobility aids) if they are needed. These include: Canes. Walkers. Scooters. Crutches. Turn on the lights when you go into a dark area. Replace any light bulbs as soon as they burn out. Set up your furniture so you have a clear path. Avoid moving your furniture around. If any of your floors are uneven, fix them. If there are any pets around you, be aware of where they are. Review your medicines with your doctor. Some medicines can make you feel dizzy. This can increase your chance of falling. Ask your doctor what other things that you can do to help prevent falls. This information is not intended to replace advice given to you by your health care provider. Make sure you discuss any questions you have with your health care provider. Document Released: 12/16/2008 Document Revised: 07/28/2015 Document Reviewed: 03/26/2014 Elsevier Interactive Patient Education  2017 [employer].

## 2021-12-25 NOTE — Progress Notes (Signed)
I connected with  Mark Mayers Sr. on 12/25/21 by a audio enabled telemedicine application and verified that I am speaking with the correct person using two identifiers.  Patient Location: Home  Provider Location: Office/Clinic  I discussed the limitations of evaluation and management by telemedicine. The patient expressed understanding and agreed to proceed.   Subjective:   Mark Wachsmuth Sr. is a 71 y.o. male who presents for Medicare Annual/Subsequent preventive examination.  Review of Systems     Cardiac Risk Factors include: advanced age (>71mn, >>44women);dyslipidemia;male gender;hypertension;diabetes mellitus     Objective:    Today's Vitals   12/25/21 1015  Weight: 213 lb (96.6 kg)   Body mass index is 25.93 kg/m.     12/25/2021   10:21 AM 01/18/2021    8:53 AM 01/17/2021   10:37 AM 10/14/2020    3:08 PM 03/21/2020   10:06 AM 01/08/2018    3:16 PM  Advanced Directives  Does Patient Have a Medical Advance Directive? Yes No No No No Yes  Type of [place] will     [place] Does patient want to make changes to medical advance directive?      No - Patient declined  Copy of [place] Chart? No - copy requested       Would patient like information on creating a medical advance directive?  No - Patient declined  No - Patient declined      Current Medications (verified) Outpatient Encounter Medications as of 12/25/2021  Medication Sig   Accu-Chek Softclix Lancets lancets Use as instructed   acetaminophen (TYLENOL) 500 MG tablet Take 1,000 mg by mouth every 6 (six) hours as needed for moderate pain.   Ascorbic Acid (VITAMIN C PO) Take 1 tablet by mouth daily.   b complex vitamins capsule Take 1 capsule by mouth daily.   Blood Glucose Monitoring Suppl (ACCU-CHEK GUIDE ME) w/Device KIT Use as directed   CRANBERRY PO Take 1 capsule by mouth daily.   docusate sodium  (COLACE) 100 MG capsule Take 1 capsule (100 mg total) by mouth daily as needed for up to 30 doses.   empagliflozin (JARDIANCE) 10 MG TABS tablet Take 1 tablet (10 mg total) by mouth daily before breakfast.   Ferrous Sulfate (IRON PO) Take 1 tablet by mouth daily.   gabapentin (NEURONTIN) 100 MG capsule TAKE 2 CAPSULES BY MOUTH AT BEDTIME   GARLIC PO Take 1 capsule by mouth daily.   glucose blood (ACCU-CHEK GUIDE) test strip Use as instructed   hydrochlorothiazide (HYDRODIURIL) 25 MG tablet TAKE 1 TABLET BY MOUTH EVERY DAY   metFORMIN (GLUCOPHAGE) 1000 MG tablet TAKE 1 TABLET BY MOUTH TWICE A DAY WITH FOOD   metoprolol succinate (TOPROL-XL) 100 MG 24 hr tablet TAKE 1 TABLET BY MOUTH WITH OR IMMEDIATELY FOLLOWING A MEAL.   Naphazoline-Pheniramine (VISINE-A OP) Place 1 drop into both eyes daily as needed (dry eyes).   sildenafil (REVATIO) 20 MG tablet Take 1-5 tablets as needed.   sildenafil (VIAGRA) 100 MG tablet Take 1/2 to 1 tablet as needed for erectile dysfunction up to every 48 hours   simvastatin (ZOCOR) 10 MG tablet TAKE 1 TABLET BY MOUTH EVERY DAY   TURMERIC PO Take 1 capsule by mouth daily.   VITAMIN D PO Take 1 capsule by mouth daily.   [DISCONTINUED] metFORMIN (GLUCOPHAGE) 1000 MG tablet TAKE 1 TABLET BY MOUTH TWICE DAILY WITH A MEAL   No  facility-administered encounter medications on file as of 12/25/2021.    Allergies (verified) Patient has no known allergies.   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 procedural complication   Diabetes Brother    Social History   Socioeconomic History   Marital status: Married    Spouse name: Not on file   Number of children: Not on file   Years of education: Not on file   Highest education level: Not on file  Occupational History   Not on file  Tobacco Use   Smoking status: Never   Smokeless tobacco: Never  Vaping Use   Vaping Use: Never used  Substance and Sexual Activity   Alcohol use: No   Drug use: No   Sexual activity: Yes  Other Topics Concern   Not on file  Social History Narrative   Married 3 children. Wife and son patient here. 2 grandkids.       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    [employer] under[employer]   1978 [employer]- 4 degrees through 2000- [employer], math. 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    Social Determinants of Health   Financial Resource Strain: Low Risk  (12/25/2021)   Overall Financial Resource Strain (CARDIA)    Difficulty of Paying Living Expenses: Not hard at all  Food  Insecurity: No Food Insecurity (12/25/2021)   Hunger Vital Sign    Worried About Running Out of Food in the Last Year: Never true    Ran Out of Food in the Last Year: Never true  Transportation Needs: No Transportation Needs (12/25/2021)   PRAPARE - Hydrologist (Medical): No    Lack of Transportation (Non-Medical): No  Physical Activity: Sufficiently Active (12/25/2021)   Exercise Vital Sign    Days of Exercise per Week: 3 days    Minutes of  Exercise per Session: 50 min  Stress: No Stress Concern Present (12/25/2021)   [place]    Feeling of Stress : Not at all  Social Connections: [place] (12/25/2021)   Social Connection and Isolation Panel [NHANES]    Frequency of Communication with Friends and Family: Three times a week    Frequency of Social Gatherings with Friends and Family: More than three times a week    Attends Religious Services: More than 4 times per year    Active Member of Clubs or Organizations: Yes    Attends Archivist Meetings: 1 to 4 times per year    Marital Status: Married    Tobacco Counseling Counseling given: Not Answered   Clinical Intake:  Pre-visit preparation completed: Yes  Pain : No/denies pain     BMI - recorded: 25.93 Nutritional Status: BMI 25 -29 Overweight Nutritional Risks: None Diabetes: Yes CBG done?: Yes (101 per pt) CBG resulted in Enter/ Edit results?: No Did pt. bring in CBG monitor from home?: No  How often do you need to have someone help you when you read instructions, pamphlets, or other written materials from your doctor or pharmacy?: 1 - Never  Diabetic?Nutrition Risk Assessment:  Has the patient had any N/V/D within the last 2 months?  No  Does the patient have any non-healing wounds?  No  Has the patient had any unintentional weight loss or weight gain?  No   Diabetes:  Is the patient diabetic?   Yes  If diabetic, was a CBG obtained today?  Yes  Did the patient bring in their glucometer from home?  No  How often do you monitor your CBG's? Daily.   Financial Strains and Diabetes Management:  Are you having any financial strains with the device, your supplies or your medication? No .  Does the patient want to be seen by Chronic Care Management for management of their diabetes?  No  Would the patient like to be referred to a Nutritionist or for Diabetic Management?  No   Diabetic Exams:  Diabetic Eye Exam: Overdue for diabetic eye exam. Pt has been advised about the importance in completing this exam. Patient advised to call and schedule an eye exam. Diabetic Foot Exam: Completed 02/16/21   Interpreter Needed?: No  Information entered by :: Charlott Rakes, LPN   Activities of Daily Living    12/25/2021   10:22 AM 01/18/2021    8:55 AM  In your present state of health, do you have any difficulty performing the following activities:  Hearing? 0   Vision? 0   Difficulty concentrating or making decisions? 0   Walking or climbing stairs? 0   Dressing or bathing? 0   Doing errands, shopping? 0 0  Preparing Food and eating ? N   Using the Toilet? N   In the past six months, have you accidently leaked urine? N   Do you have problems with loss of bowel control? N   Managing your Medications? N   Managing your Finances? N   Housekeeping or managing your Housekeeping? N     Patient Care Team: [name], MD as PCP - General (Family Medicine)  Indicate any recent Medical Services you may have received from other than [facility] providers in the past year (date may be approximate).     Assessment:   This is a routine wellness examination for [place].  Hearing/Vision screen Vision Screening - Comments:: Pt follows  up with [facility] for [facility] exams    Dietary issues and exercise activities discussed: Current Exercise Habits: Home exercise routine, Type of  exercise: Other - see comments (gym), Time (Minutes): 50, Frequency (Times/Week): 3, Weekly Exercise (Minutes/Week): 150   Goals Addressed             This Visit's Progress    Patient Stated       Maintain health and activity        Depression Screen    12/25/2021   10:18 AM 11/02/2021    2:05 PM 10/14/2020    3:07 PM 09/09/2020    2:19 PM 10/15/2019    9:58 AM 01/08/2018    3:30 PM 02/21/2017   12:56 PM  PHQ 2/9 Scores  PHQ - 2 Score 0 0 0 0 0 0 0  PHQ- 9 Score 0 0   0 0     Fall Risk    12/25/2021   10:22 AM 11/02/2021    2:02 PM 10/14/2020    3:09 PM 09/09/2020    2:19 PM 10/15/2019    9:59 AM  Fall Risk   Falls in the past year? 0 0 0 0 0  Number falls in past yr: 0 0 0 0 0  Injury with Fall? 0 0 0 0 0  Risk for fall due to : Impaired vision No Fall Risks Impaired vision No Fall Risks   Follow up Falls prevention discussed Falls evaluation completed Falls prevention discussed Falls evaluation completed     FALL RISK PREVENTION PERTAINING TO THE HOME:  Any stairs in or around the home? Yes  If so, are there any without handrails? No  Home free of loose throw rugs in walkways, pet beds, electrical cords, etc? Yes  Adequate lighting in your home to reduce risk of falls? Yes   ASSISTIVE DEVICES UTILIZED TO PREVENT FALLS:  Life alert? No  Use of a cane, walker or w/c? No  Grab bars in the bathroom? No  Shower chair or bench in shower? No  Elevated toilet seat or a handicapped toilet? No   TIMED UP AND GO:  Was the test performed? No .   Cognitive Function:        12/25/2021   10:25 AM 10/14/2020    3:09 PM 01/08/2018    3:39 PM  6CIT Screen  What Year? 0 points 0 points 0 points  What month? 0 points 0 points 0 points  What time? 0 points 0 points 0 points  Count back from 20 0 points 0 points 0 points  Months in reverse 0 points 0 points 0 points  Repeat phrase 0 points 0 points 0 points  Total Score 0 points 0 points 0 points     Immunizations Immunization History  Administered Date(s) Administered   Fluad Quad(high Dose 65+) 10/31/2018, 11/05/2019, 11/23/2021   Influenza Split 03/05/2011   Influenza Whole 03/05/2005   Influenza, High Dose Seasonal PF 02/21/2017, 01/08/2018, 11/17/2020   Influenza,inj,Quad PF,6+ Mos 04/29/2014   Moderna Sars-Covid-2 Vaccination 04/16/2019, 05/14/2019, 12/10/2019, 08/10/2020   Pfizer Covid-19 Vaccine Bivalent Booster 41yr & up 11/17/2020   Pneumococcal Conjugate-13 09/01/2014   Pneumococcal Polysaccharide-23 03/05/2006, 01/04/2015, 02/02/2016   Td 03/06/1999   Tdap 03/05/2011, 03/02/2013   Unspecified SARS-COV-2 Vaccination 11/23/2021   Zoster Recombinat (Shingrix) 10/01/2020, 08/30/2021   Zoster, Live 09/03/2014    TDAP status: Up to date  Flu Vaccine status: Up to date  Pneumococcal vaccine status: Up to date  Covid-19  vaccine status: Completed vaccines  Qualifies for Shingles Vaccine? Yes   Zostavax completed Yes   Shingrix Completed?: Yes  Screening Tests Health Maintenance  Topic Date Due   COLONOSCOPY (Pts 45-97yr Insurance coverage will need to be confirmed)  Never done   OPHTHALMOLOGY EXAM  06/18/2021   Diabetic kidney evaluation - Urine ACR  10/14/2021   FOOT EXAM  02/16/2022   HEMOGLOBIN A1C  05/03/2022   Diabetic kidney evaluation - GFR measurement  11/03/2022   TETANUS/TDAP  03/03/2023   Pneumonia Vaccine 71 Years old  Completed   INFLUENZA VACCINE  Completed   COVID-19 Vaccine  Completed   Hepatitis C Screening  Completed   Zoster Vaccines- Shingrix  Completed   HPV VACCINES  Aged Out    Health Maintenance  Health Maintenance Due  Topic Date Due   COLONOSCOPY (Pts 45-493yrInsurance coverage will need to be confirmed)  Never done   OPHTHALMOLOGY EXAM  06/18/2021   Diabetic kidney evaluation - Urine ACR  10/14/2021    Colonoscopy pt stated he will call for appt    Additional Screening:  Hepatitis C Screening: Completed  03/14/17  Vision Screening: Recommended annual ophthalmology exams for early detection of glaucoma and other disorders of the eye. Is the patient up to date with their annual eye exam?  No  Who is the provider or what is the name of the office in which the patient attends annual eye exams? [facility]  If pt is not established with a provider, would they like to be referred to a provider to establish care? No .   Dental Screening: Recommended annual dental exams for proper oral hygiene  Community Resource Referral / Chronic Care Management: CRR required this visit?  No   CCM required this visit?  No      Plan:     I have personally reviewed and noted the following in the patient's chart:   Medical and social history Use of alcohol, tobacco or illicit drugs  Current medications and supplements including opioid prescriptions. Patient is not currently taking opioid prescriptions. Functional ability and status Nutritional status Physical activity Advanced directives List of other physicians Hospitalizations, surgeries, and ER visits in previous 12 months Vitals Screenings to include cognitive, depression, and falls Referrals and appointments  In addition, I have reviewed and discussed with patient certain preventive protocols, quality metrics, and best practice recommendations. A written personalized care plan for preventive services as well as general preventive health recommendations were provided to patient.     TiWillette BraceLPN   [id number] Nurse Notes: none

## 2022-01-10 ENCOUNTER — Encounter: Payer: Self-pay | Admitting: Family Medicine

## 2022-01-12 NOTE — Telephone Encounter (Signed)
Patient requests Mark Ali call him.

## 2022-02-13 ENCOUNTER — Encounter: Payer: Self-pay | Admitting: Family Medicine

## 2022-02-13 ENCOUNTER — Ambulatory Visit (INDEPENDENT_AMBULATORY_CARE_PROVIDER_SITE_OTHER): Payer: Medicare PPO | Admitting: Family Medicine

## 2022-02-13 VITALS — BP 134/76 | HR 59 | Temp 97.6°F | Ht 76.0 in | Wt 207.0 lb

## 2022-02-13 DIAGNOSIS — E119 Type 2 diabetes mellitus without complications: Secondary | ICD-10-CM | POA: Diagnosis not present

## 2022-02-13 DIAGNOSIS — R351 Nocturia: Secondary | ICD-10-CM

## 2022-02-13 DIAGNOSIS — Z0000 Encounter for general adult medical examination without abnormal findings: Secondary | ICD-10-CM | POA: Diagnosis not present

## 2022-02-13 LAB — MICROALBUMIN / CREATININE URINE RATIO
Creatinine,U: 55.1 mg/dL
Microalb Creat Ratio: 1.3 mg/g (ref 0.0–30.0)
Microalb, Ur: <0.7 mg/dL (ref 0.0–1.9)

## 2022-02-13 MED ORDER — KETOCONAZOLE 2 % EX CREA: 1.0000 | TOPICAL_CREAM | Freq: Every day | CUTANEOUS | 2 refills | Status: AC

## 2022-02-13 NOTE — Progress Notes (Addendum)
Phone: [phone]   Subjective:  Patient presents today for their annual physical. Chief complaint-noted.   See problem oriented charting- ROS- full  review of systems was completed and negative  except for: increased urination  The following were reviewed and entered/updated in epic: Past Medical History:  Diagnosis Date   Diabetes type 2, controlled ([place])    Hyperlipidemia    Hypertension    UNSPECIFIED TACHYCARDIA 05/06/2007   Listed 05/06/2007- do not see recurrent tachycardia since that time    Patient Active Problem List   Diagnosis Date Noted   Diabetes mellitus type II, controlled ([place]) 11/25/2006    Priority: High   Aortic atherosclerosis ([place]) 12/11/2019    Priority: Medium    Hyperlipidemia associated with type 2 diabetes mellitus ([place]) 02/21/2017    Priority: Medium    Essential hypertension 11/25/2006    Priority: Medium    History of small bowel obstruction 01/18/2021    Priority: Low   Bilateral primary osteoarthritis of knee 04/06/2020    Priority: Low   Right-sided low back pain with right-sided sciatica 01/01/2020    Priority: Low   Erectile dysfunction 09/01/2014    Priority: Low   Anemia, chronic disease 07/14/2009    Priority: Low   Olecranon bursitis of left elbow 07/27/2021   Past Surgical History:  Procedure Laterality Date   CYSTOSCOPY WITH BIOPSY N/A 03/25/2020   Procedure: CYSTOSCOPY WITH PROSTATE UERTHRAL  BIOPSY/ TRANSURETHRAL RESECTION OF PROSTATE/ BLADDER BIOPSY/ BILATERAL RETROGRADE/ URETEROSCOPY;  Surgeon: Janith Lima, MD;  Location: [facility];  Service: Urology;  Laterality: N/A;   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 procedural complication   Diabetes Brother     Medications- reviewed and updated Current Outpatient Medications  Medication Sig Dispense Refill   Accu-Chek Softclix Lancets lancets Use as instructed 100 each 5   acetaminophen (TYLENOL) 500 MG tablet Take 1,000 mg by mouth every 6 (six) hours as needed for moderate pain.     Ascorbic Acid (VITAMIN C PO) Take 1 tablet by mouth daily.     b complex vitamins capsule Take 1 capsule by mouth daily.     Blood Glucose Monitoring Suppl (ACCU-CHEK GUIDE ME) w/Device KIT Use as directed 1 kit 1   CRANBERRY PO Take 1 capsule by mouth daily.     docusate sodium (COLACE) 100 MG capsule Take 1 capsule (100 mg total) by mouth daily as needed for up to 30 doses. 30 capsule 0   empagliflozin (JARDIANCE) 10 MG TABS tablet Take 1 tablet (10 mg total) by mouth daily before breakfast. 30 tablet 5   Ferrous Sulfate (IRON PO) Take 1 tablet by mouth daily.     gabapentin (NEURONTIN) 100 MG capsule TAKE 2 CAPSULES BY MOUTH AT BEDTIME 119 capsule 0   GARLIC PO Take 1 capsule by mouth daily.  glucose blood (ACCU-CHEK GUIDE) test strip Use as instructed 100 each 5   hydrochlorothiazide (HYDRODIURIL) 25 MG tablet TAKE 1 TABLET BY MOUTH EVERY DAY 90 tablet 0   ketoconazole (NIZORAL) 2 % cream Apply 1 Application topically daily. For 2 weeks- if not improved- can stop and let me know 60 g 2   metFORMIN (GLUCOPHAGE) 1000 MG tablet TAKE 1 TABLET BY MOUTH TWICE A DAY WITH FOOD 180 tablet 0   metoprolol succinate (TOPROL-XL) 100 MG 24 hr tablet TAKE 1 TABLET BY MOUTH WITH OR IMMEDIATELY FOLLOWING A MEAL. 90 tablet 0   Naphazoline-Pheniramine (VISINE-A OP) Place 1 drop into both eyes daily as needed (dry eyes).     sildenafil (REVATIO) 20 MG tablet Take 1-5 tablets as needed. 45 tablet 5   sildenafil (VIAGRA) 100 MG tablet Take 1/2 to 1 tablet as needed for erectile dysfunction up to every  48 hours 10 tablet 5   simvastatin (ZOCOR) 10 MG tablet TAKE 1 TABLET BY MOUTH EVERY DAY 90 tablet 3   TURMERIC PO Take 1 capsule by mouth daily.     VITAMIN D PO Take 1 capsule by mouth daily.     No current facility-administered medications for this visit.    Allergies-reviewed and updated No Known Allergies  Social History   Social History Narrative   Married 3 children. Wife and son patient here. 2 grandkids.       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    [employer] under[employer]   1978 [employer]- 4 degrees through 2000- [employer], math. 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       [place]- pretty involved there- deacon, helps with education.    Objective  Objective:  BP 134/76   Pulse (!) 59   Temp 97.6 F (36.4 C) (Temporal)   Ht _0  (1.93 m)   Wt 207 lb (93.9 kg)   SpO2 97%   BMI 25.20 kg/m  Gen: NAD, resting comfortably HEENT: Mucous membranes are moist. Oropharynx normal Neck: no thyromegaly CV: RRR no murmurs rubs or gallops Lungs: CTAB no crackles, wheeze, rhonchi Abdomen: soft/nontender/nondistended/normal bowel sounds. No rebound or guarding.  Ext: no edema Skin: warm, dry Neuro: grossly normal, moves all extremities, PERRLA Declines GU exam for possible phimosis   Assessment and Plan  71 y.o. male presenting for annual physical.  Health Maintenance counseling: 1. Anticipatory guidance: Patient counseled regarding regular dental exams -q6 months, eye exams -yearly,  avoiding smoking and second hand smoke , limiting alcohol to 2 beverages per day - doesn't  drink, no illicit drugs .   2. Risk factor reduction:  Advised patient of need for regular exercise and diet rich and fruits and vegetables to reduce risk of heart attack and stroke.  Exercise- [employer] every other day MWF- makes up if misses a day.  Diet/weight management-Down 15 pounds from last physical! He doesn't like it from clothes perspective.  Wt Readings from Last 3 Encounters:  02/13/22 207 lb (93.9 kg)  12/25/21 213 lb (96.6 kg)  12/08/21 213 lb (96.6 kg)  3. Immunizations/screenings/ancillary studies-discussed RSV  -had covid Immunization History  Administered Date(s) Administered  Fluad Quad(high Dose 65+) 10/31/2018, 11/05/2019, 11/23/2021   Influenza Split 03/05/2011   Influenza Whole 03/05/2005   Influenza, High Dose Seasonal PF 02/21/2017, 01/08/2018, 11/17/2020   Influenza,inj,Quad PF,6+ Mos 04/29/2014   Moderna Sars-Covid-2 Vaccination 04/16/2019, 05/14/2019, 12/10/2019, 08/10/2020   Pfizer Covid-19 Vaccine Bivalent Booster 86yr & up 11/17/2020   Pneumococcal Conjugate-13 09/01/2014   Pneumococcal Polysaccharide-23 03/05/2006, 01/04/2015, 02/02/2016   Td 03/06/1999   Tdap 03/05/2011, 03/02/2013   Unspecified SARS-COV-2 Vaccination 11/23/2021   Zoster Recombinat (Shingrix) 10/01/2020, 08/30/2021   Zoster, Live 09/03/2014  4. Prostate cancer screening- low risk trend- he prefers to continue through age 71 Takes saw palmetto  Lab Results  Component Value Date   PSA 0.54 02/16/2021   PSA 0.4 10/15/2019   PSA 0.4 10/15/2019   5. Colon cancer screening - patient still has not completed colonoscopy but did call to schedule- he is waiting on a call back 6. Skin cancer screening- lower risk due to melanin content. Advised regular sunscreen use. Denies worrisome, changing, or new skin lesions.   7. Smoking associated screening (lung cancer screening, AAA screen 65-75, UA)- never smoker  8. STD screening - opts out as monogamous and married   Status of chronic or  acute concerns   # Diabetes S: Medication:Glipizide 10 mg twice daily, Metformin 1000 mg twice daily, jardiance 10 mg (Does have urinary frequency- staying hydrated) CBGs- not checking much Exercise and diet- losing weight on jardiance Lab Results  Component Value Date   HGBA1C 7.1 (H) 11/02/2021   HGBA1C 7.3 (H) 02/16/2021   HGBA1C 7.0 (H) 10/14/2020  A/P: hopefully improved- update a1c- continue current medications  - sounds like possible yeast causing phimosis in uncircumcised male- saw some white but thought vaseline- we will trial topical ketoconazole- if this isnt effective we can stop jardiance -Trial ketoconazole for up to 14 days to see if skin will move better- may have retracted due to fungus- if not helping we may need to stop jardiance- if worsens may need urology consult  #hypertension S: medication: Hydrochlorothiazide 25 mg, metoprolol 100 mg 24-hour BP at home most recently 128/71 BP Readings from Last 3 Encounters:  02/13/22 134/76  12/08/21 118/76  11/02/21 128/78  A/P: stable- continue current medicines . Home readings at more aggressive goal under 130!   #hyperlipidemia S: Medication:Simvastatin 10 mg Lab Results  Component Value Date   CHOL 132 10/14/2020   HDL 42 10/14/2020   LDLCALC 71 10/14/2020   LDLDIRECT 70.0 02/16/2021   TRIG 103 10/14/2020   CHOLHDL 3.1 10/14/2020  A/P: very close to ideal goal- update lipids today- continue current medications   # erectile dysfunction-uses sildenafil as needed   Recommended follow up: Return in about 4 months (around 06/15/2022) for followup or sooner if needed.Schedule b4 you leave. Future Appointments  Date Time Provider [place] 02/14/2022  4:15 PM EEdrick Kins DPM [facility]  12/31/2022 10:30 AM [facility] COACH [facility]   Lab/Order associations: fasting   ICD-10-CM   1. Preventative health care  Z00.00     2. Controlled type 2 diabetes mellitus without complication,  without long-term current use of insulin (HCC)  E11.9 CBC with Differential/Platelet    Comprehensive metabolic panel    Lipid panel    Hemoglobin A1c    Microalbumin / creatinine urine ratio    3. Nocturia  R35.1 PSA     Meds ordered this encounter  Medications   ketoconazole (NIZORAL) 2 % cream    Sig:  Apply 1 Application topically daily. For 2 weeks- if not improved- can stop and let me know    Dispense:  60 g    Refill:  2    Return precautions advised.  Garret Reddish, MD

## 2022-02-13 NOTE — Patient Instructions (Addendum)
Please stop by lab before you go If you have mychart- we will send your results within 3 business days of [place] receiving them.  If you do not have mychart- we will call you about results within 5 business days of [place] receiving them.  *please also note that you will see labs on mychart as soon as they post. I will later go in and write notes on them- will say "notes from Dr. Durene Cal"   Trial ketoconazole for up to 14 days to see if skin will move better- may have retracted due to fungus- if not helping we may need to stop jardiance- if worsens may need urology consult  Recommended follow up: Return in about 4 months (around 06/15/2022) for followup or sooner if needed.Schedule b4 you leave.

## 2022-02-14 ENCOUNTER — Ambulatory Visit: Payer: Medicare PPO | Admitting: Podiatry

## 2022-02-14 ENCOUNTER — Telehealth: Payer: Self-pay | Admitting: Family Medicine

## 2022-02-14 ENCOUNTER — Encounter: Payer: Self-pay | Admitting: Family Medicine

## 2022-02-14 ENCOUNTER — Encounter: Payer: Self-pay | Admitting: Podiatry

## 2022-02-14 VITALS — BP 135/69 | HR 63

## 2022-02-14 DIAGNOSIS — L97522 Non-pressure chronic ulcer of other part of left foot with fat layer exposed: Secondary | ICD-10-CM

## 2022-02-14 DIAGNOSIS — M2012 Hallux valgus (acquired), left foot: Secondary | ICD-10-CM

## 2022-02-14 DIAGNOSIS — E0843 Diabetes mellitus due to underlying condition with diabetic autonomic (poly)neuropathy: Secondary | ICD-10-CM | POA: Diagnosis not present

## 2022-02-14 LAB — COMPREHENSIVE METABOLIC PANEL
ALT: 10 U/L (ref 0–53)
AST: 12 U/L (ref 0–37)
Albumin: 4.1 g/dL (ref 3.5–5.2)
Alkaline Phosphatase: 89 U/L (ref 39–117)
BUN: 22 mg/dL (ref 6–23)
CO2: 31 mEq/L (ref 19–32)
Calcium: 9.9 mg/dL (ref 8.4–10.5)
Chloride: 99 mEq/L (ref 96–112)
Creatinine, Ser: 1.68 mg/dL — ABNORMAL HIGH (ref 0.40–1.50)
GFR: 40.2 mL/min — ABNORMAL LOW (ref >60.00)
Glucose, Bld: 305 mg/dL — ABNORMAL HIGH (ref 70–99)
Potassium: 5.7 mEq/L — ABNORMAL HIGH (ref 3.5–5.1)
Sodium: 139 mEq/L (ref 135–145)
Total Bilirubin: 0.5 mg/dL (ref 0.2–1.2)
Total Protein: 7 g/dL (ref 6.0–8.3)

## 2022-02-14 LAB — LIPID PANEL
Cholesterol: 150 mg/dL (ref 0–200)
HDL: 50.2 mg/dL (ref >39.00)
LDL Cholesterol: 71 mg/dL (ref 0–99)
NonHDL: 100.21
Total CHOL/HDL Ratio: 3
Triglycerides: 144 mg/dL (ref 0.0–149.0)
VLDL: 28.8 mg/dL (ref 0.0–40.0)

## 2022-02-14 LAB — CBC WITH DIFFERENTIAL/PLATELET
Basophils Absolute: 0 10*3/uL (ref 0.0–0.1)
Basophils Relative: 1 % (ref 0.0–3.0)
Eosinophils Absolute: 0.1 10*3/uL (ref 0.0–0.7)
Eosinophils Relative: 2.1 % (ref 0.0–5.0)
HCT: 38.3 % — ABNORMAL LOW (ref 39.0–52.0)
Hemoglobin: 12.5 g/dL — ABNORMAL LOW (ref 13.0–17.0)
Lymphocytes Relative: 25.1 % (ref 12.0–46.0)
Lymphs Abs: 1.1 10*3/uL (ref 0.7–4.0)
MCHC: 32.5 g/dL (ref 30.0–36.0)
MCV: 96.6 fl (ref 78.0–100.0)
Monocytes Absolute: 0.4 10*3/uL (ref 0.1–1.0)
Monocytes Relative: 8.7 % (ref 3.0–12.0)
Neutro Abs: 2.9 10*3/uL (ref 1.4–7.7)
Neutrophils Relative %: 63.1 % (ref 43.0–77.0)
Platelets: 291 10*3/uL (ref 150.0–400.0)
RBC: 3.97 Mil/uL — ABNORMAL LOW (ref 4.22–5.81)
RDW: 13.8 % (ref 11.5–15.5)
WBC: 4.6 10*3/uL (ref 4.0–10.5)

## 2022-02-14 LAB — PSA: PSA: 0.33 ng/mL (ref 0.10–4.00)

## 2022-02-14 LAB — HEMOGLOBIN A1C: Hgb A1c MFr Bld: 12.2 % — ABNORMAL HIGH (ref 4.6–6.5)

## 2022-02-14 NOTE — Progress Notes (Signed)
Chief Complaint  Patient presents with   Callouses    Patient is here for callous left foot that is painful needs shaved down.    Subjective:  71 y.o. male with PMHx of diabetes mellitus presenting today for evaluation of a wound that developed to the plantar aspect of the first MTP left foot.  Patient states that he developed a callus and he tried to trim it himself.  He there was some bleeding involved.  He immediately called the office and presents for further treatment and evaluation   Past Medical History:  Diagnosis Date   Diabetes type 2, controlled (HCC)    Hyperlipidemia    Hypertension    UNSPECIFIED TACHYCARDIA 05/06/2007   Listed 05/06/2007- do not see recurrent tachycardia since that time     Past Surgical History:  Procedure Laterality Date   CYSTOSCOPY WITH BIOPSY N/A 03/25/2020   Procedure: CYSTOSCOPY WITH PROSTATE UERTHRAL  BIOPSY/ TRANSURETHRAL RESECTION OF PROSTATE/ BLADDER BIOPSY/ BILATERAL RETROGRADE/ URETEROSCOPY;  Surgeon: Jannifer Hick, MD;  Location: [facility];  Service: Urology;  Laterality: N/A;   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  Dermatology:  Wound #1 noted to the plantar aspect of the first MTP left measuring approximately 1.0 x 1.0 x 0.1 cm (LxWxD). To the noted ulceration(s), there is no eschar. There is a moderate amount of slough, fibrin, and necrotic tissue noted. Granulation tissue and wound base is red. There is a minimal amount of serosanguineous drainage noted. There is no exposed bone muscle-tendon ligament or joint. There is no malodor. Periwound integrity is intact.  There are also hyperkeratotic preulcerative callus lesions noted to the bilateral forefoot.  No open wounds noted to these areas.  Vascular: Palpable pedal pulses bilaterally. No edema or erythema noted. Capillary refill  within normal limits.  Clinically no concern for vascular compromise  Neurological: Epicritic and protective threshold diminished bilaterally.   Musculoskeletal Exam: Hallux valgus deformity noted to the left foot with overlapping hammertoe of the second digit  Assessment: 1.  Ulcer plantar aspect of the first MTP left secondary to diabetes mellitus 2. diabetes mellitus w/ peripheral neuropathy 3.  Hyperkeratotic preulcerative callus lesions bilateral forefoot 4.  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 Patient was evaluated. 2. medically necessary excisional debridement including subcutaneous tissue was performed using a tissue nipper and a chisel blade. Excisional debridement of all the necrotic nonviable tissue down to healthy bleeding viable tissue was performed with post-debridement measurements same as pre-. 3. the wound was cleansed and dry sterile dressing applied. 4.  Silvadene cream provided to apply daily with a dressing 5.  Recommend good supportive sneakers that cushion the foot and offload pressure from the forefoot 6.  Today also we discussed the possibility of surgery to better align the first ray which would likely include first MTP arthrodesis and hammertoe repair of the second digit.  Explained that he will need 4-6 weeks in a cam boot postoperatively as well as nonweightbearing for the first 1-2 weeks.   7.  Patient is to return to clinic in 3 weeks for follow-up  *Going to [place] with his family   Felecia Shelling, DPM [facility]  Dr. Felecia Shelling, DPM    [address] [place], [place] [address]                Office [phone]  Fax [phone]

## 2022-02-14 NOTE — Telephone Encounter (Signed)
Patient is requesting that either PCP or PCP team give pt a callback in regards to his lab results. States it is urgent.

## 2022-02-15 ENCOUNTER — Other Ambulatory Visit: Payer: Self-pay

## 2022-02-15 DIAGNOSIS — E875 Hyperkalemia: Secondary | ICD-10-CM

## 2022-02-15 NOTE — Telephone Encounter (Signed)
Patient requests Dr. Durene Cal call Patient at ph# [phone].  Declined office visit.

## 2022-02-15 NOTE — Telephone Encounter (Signed)
FYI

## 2022-02-15 NOTE — Telephone Encounter (Signed)
Pt has been communicating via mychart, questions have been answered.

## 2022-02-19 ENCOUNTER — Ambulatory Visit: Payer: Medicare PPO | Admitting: Podiatry

## 2022-02-20 ENCOUNTER — Other Ambulatory Visit: Payer: Self-pay | Admitting: Family Medicine

## 2022-02-22 ENCOUNTER — Other Ambulatory Visit: Payer: Self-pay | Admitting: Family Medicine

## 2022-02-22 DIAGNOSIS — E119 Type 2 diabetes mellitus without complications: Secondary | ICD-10-CM

## 2022-03-06 ENCOUNTER — Ambulatory Visit: Payer: Medicare PPO | Admitting: Podiatry

## 2022-03-22 ENCOUNTER — Other Ambulatory Visit: Payer: Self-pay | Admitting: Family Medicine

## 2022-03-22 DIAGNOSIS — E139 Other specified diabetes mellitus without complications: Secondary | ICD-10-CM

## 2022-03-26 ENCOUNTER — Other Ambulatory Visit: Payer: Self-pay | Admitting: Family Medicine

## 2022-04-19 ENCOUNTER — Telehealth: Payer: Self-pay

## 2022-04-19 NOTE — Patient Outreach (Signed)
  Care Coordination   04/19/2022 Name: Mark Ali [facility] MRN: [id number] DOB: 11-11-1950   Care Coordination Outreach Attempts:  An unsuccessful telephone outreach was attempted today to offer the patient information about available care coordination services as a benefit of their health plan.   Follow Up Plan:  Additional outreach attempts will be made to offer the patient care coordination information and services.   Encounter Outcome:  No Answer   Care Coordination Interventions:  No, not indicated    Daneen Schick, BSW, CDP Social Worker, Certified Dementia Practitioner [facility]  Care Coordination [phone]

## 2022-04-30 ENCOUNTER — Other Ambulatory Visit: Payer: Self-pay | Admitting: Family Medicine

## 2022-06-04 ENCOUNTER — Other Ambulatory Visit: Payer: Self-pay | Admitting: Family Medicine

## 2022-06-15 ENCOUNTER — Ambulatory Visit: Payer: Medicare PPO | Admitting: Family Medicine

## 2022-06-17 ENCOUNTER — Other Ambulatory Visit: Payer: Self-pay | Admitting: Family Medicine

## 2022-06-17 DIAGNOSIS — E139 Other specified diabetes mellitus without complications: Secondary | ICD-10-CM

## 2022-06-25 ENCOUNTER — Ambulatory Visit (INDEPENDENT_AMBULATORY_CARE_PROVIDER_SITE_OTHER): Payer: Medicare PPO

## 2022-06-25 ENCOUNTER — Ambulatory Visit: Payer: Medicare PPO | Admitting: Podiatry

## 2022-06-25 DIAGNOSIS — E119 Type 2 diabetes mellitus without complications: Secondary | ICD-10-CM | POA: Diagnosis not present

## 2022-06-25 DIAGNOSIS — E0843 Diabetes mellitus due to underlying condition with diabetic autonomic (poly)neuropathy: Secondary | ICD-10-CM

## 2022-06-25 DIAGNOSIS — L97522 Non-pressure chronic ulcer of other part of left foot with fat layer exposed: Secondary | ICD-10-CM

## 2022-06-25 MED ORDER — GENTAMICIN SULFATE 0.1 % EX CREA: 1.0000 | TOPICAL_CREAM | Freq: Two times a day (BID) | CUTANEOUS | 1 refills | Status: DC

## 2022-06-25 MED ORDER — DOXYCYCLINE HYCLATE 100 MG PO TABS: 100.0000 mg | ORAL_TABLET | Freq: Two times a day (BID) | ORAL | 0 refills | Status: DC

## 2022-06-25 NOTE — Progress Notes (Signed)
Chief Complaint  Patient presents with   Diabetic Ulcer    Patient came in today for left foot Diabetic ulcer right foot 2nd toe, started 2 weeks ago, some swelling, No Drainage, Burning numbness, itching, A1c -  12.2 BG-107     Subjective:  72 y.o. male with PMHx of diabetes mellitus presenting today for evaluation of a wound that developed to the left second toe.  Patient also noticed some swelling to the area as well.  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  Dermatology:  Ulcer noted to the dorsal aspect of the second digit left foot measuring approximately 0.5 x 0.7 x 0.2 cm (LxWxD).  Please see above noted photo to the noted ulceration(s), there is no eschar. There is a moderate amount of slough, fibrin, and necrotic tissue noted. Granulation tissue and wound base is red. There is a minimal amount of serosanguineous drainage noted. There is no exposed bone muscle-tendon ligament or joint. There is no malodor. Periwound integrity is intact.  There are also hyperkeratotic preulcerative callus lesions noted to the bilateral forefoot.  No open wounds noted to these areas.  Vascular: Palpable pedal pulses bilaterally.  Edema noted  throughout the left foot and ankle.  No erythema.  Capillary refill within normal limits.  Clinically no concern for vascular compromise  Neurological: Epicritic and protective threshold diminished bilaterally.   Musculoskeletal Exam: Hallux valgus deformity noted to the left foot with overlapping hammertoe of the second digit  Radiographic exam LT foot 06/25/2022: Normal osseous mineralization.  Moderate degenerative changes noted.  Hallux valgus deformity with hammertoe contracture of the second digit also noted.  Prominent head of the proximal phalanx contributing to the overlying ulcer development.  There is no clear indication of osseous erosions or cortical destruction that would be concerning at the moment for osteomyelitis  Assessment: 1.  Ulcer plantar aspect of the first MTP left secondary to diabetes mellitus 2. diabetes mellitus w/ peripheral neuropathy 3.  Hyperkeratotic preulcerative callus lesions bilateral forefoot 4.  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  X-rays reviewed.  Comprehensive diabetic foot exam performed today -Medically necessary excisional debridement including subcutaneous tissue was performed using a tissue nipper and a chisel blade. Excisional debridement of all the necrotic nonviable tissue down to healthy bleeding viable tissue was performed with post-debridement measurements same as pre-. -The wound was cleansed and dry sterile dressing applied. -Prescription for gentamicin cream applied daily to the ulcer with a dressing -Due to the increase swelling I did call in a prescription for doxycycline 100 mg 2 times daily #20  -Stressed the importance of working closely with  his PCP for better diabetic management of his blood sugar  -Postsurgical shoe dispensed.  WBAT  -Patient is to return to clinic in 3 weeks for follow-up    Felecia Shelling, DPM [facility]  Dr. Felecia Shelling, DPM    [address] [place], [place] [address]                Office [phone]  Fax [phone]

## 2022-06-26 ENCOUNTER — Telehealth: Payer: Self-pay | Admitting: Podiatry

## 2022-06-26 NOTE — Telephone Encounter (Signed)
Pt called asking to talk with Dr Logan Bores because he had a question. I told pt I could send him a message. He is asking about the cream you gave him yesterday at the appt, When he applies the cream should he cover the area with a bandaid or leave area exposed. He is wearing the shoe he was given. Also if he wears a sock should he cover the area with a band aid?

## 2022-06-27 ENCOUNTER — Other Ambulatory Visit: Payer: Self-pay | Admitting: Family Medicine

## 2022-07-03 ENCOUNTER — Other Ambulatory Visit: Payer: Self-pay | Admitting: Podiatry

## 2022-07-03 DIAGNOSIS — E0843 Diabetes mellitus due to underlying condition with diabetic autonomic (poly)neuropathy: Secondary | ICD-10-CM

## 2022-07-03 DIAGNOSIS — E119 Type 2 diabetes mellitus without complications: Secondary | ICD-10-CM

## 2022-07-03 DIAGNOSIS — L97522 Non-pressure chronic ulcer of other part of left foot with fat layer exposed: Secondary | ICD-10-CM

## 2022-07-10 ENCOUNTER — Telehealth: Payer: Self-pay | Admitting: Podiatry

## 2022-07-10 NOTE — Telephone Encounter (Signed)
Pt called again saying he now needs to speak with triage about his shoe. Pt needs a call back. [phone]  Please advise

## 2022-07-10 NOTE — Telephone Encounter (Signed)
Patient left message on vm that he needs to get another shoe the one he gots is dirty, transferred call to Triage . Please call

## 2022-07-10 NOTE — Telephone Encounter (Signed)
Pt asking for a refill on his Rx Gentamicin cream.   Please advise

## 2022-07-13 ENCOUNTER — Telehealth: Payer: Self-pay | Admitting: Podiatry

## 2022-07-13 NOTE — Telephone Encounter (Signed)
Pt called and spoke to [facility] earlier this week about picking up a shoe today in [place] office. Pt is aware we close early and will be here before 2pm. He would like a call back to confirm we have the shoe in the office that he is needing please.

## 2022-07-18 ENCOUNTER — Ambulatory Visit (INDEPENDENT_AMBULATORY_CARE_PROVIDER_SITE_OTHER): Payer: Medicare PPO | Admitting: Podiatry

## 2022-07-18 DIAGNOSIS — L97522 Non-pressure chronic ulcer of other part of left foot with fat layer exposed: Secondary | ICD-10-CM | POA: Diagnosis not present

## 2022-07-18 NOTE — Progress Notes (Signed)
Chief Complaint  Patient presents with   Diabetic Ulcer    Patient came in today for diabetic ulcer, left foot 2nd toe, follow-up, patient is doing better, patient denies nay pain, patient states that he does have some numbness and burning in his feet from time to time, TX: surgical shoe     Subjective:  72 y.o. male with PMHx of diabetes mellitus (last A1c 02/13/2022 was 12.2) presenting today for follow-up evaluation of a wound that developed to the left second toe.  Patient has noticed significant improvement of the wound.  He said his it has healed.  He has been WBAT in the surgical shoe as instructed.  Also applying antibiotic ointment and a Band-Aid.   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  Dermatology:  The ulcer overlying the PIPJ of the left second toe has healed.  Complete resolution and reepithelialization of the wound has occurred.  No open wound noted today.  There continues to be hyperkeratotic preulcerative callus lesions noted to the bilateral forefoot.  No open wounds noted to these areas.  Vascular: Palpable pedal pulses bilaterally.  Edema noted throughout the left foot and ankle.  No erythema.  Capillary refill within normal limits.  Clinically no concern for  vascular compromise  Neurological: Light touch and protective threshold diminished bilaterally.   Musculoskeletal Exam: Hallux valgus deformity noted to the left foot with overlapping hammertoe of the second digit  Radiographic exam LT foot 06/25/2022: Normal osseous mineralization.  Moderate degenerative changes noted.  Hallux valgus deformity with hammertoe contracture of the second digit also noted.  Prominent head of the proximal phalanx contributing to the overlying ulcer development.  There is no clear indication of osseous erosions or cortical destruction that would be concerning at the moment for osteomyelitis  Assessment: 1.  Ulcer left second toe; healed  2. diabetes mellitus w/ peripheral neuropathy 3.  Hyperkeratotic preulcerative callus lesions bilateral forefoot 4.  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  -Stressed the importance of working closely with his PCP to better manage and control his diabetes -Patient may discontinue postsurgical shoe.  Recommend good supportive tennis shoes and sneakers.  Advised against going barefoot -Return to clinic annually   Felecia Shelling, DPM [facility]  Dr. Felecia Shelling, DPM    [address], [place] [address]                Office (  [phone]  Fax [phone]

## 2022-07-31 ENCOUNTER — Ambulatory Visit: Payer: Medicare PPO | Admitting: Family Medicine

## 2022-09-07 ENCOUNTER — Other Ambulatory Visit: Payer: Self-pay | Admitting: Family Medicine

## 2022-09-11 ENCOUNTER — Telehealth: Payer: Self-pay | Admitting: Family Medicine

## 2022-09-11 ENCOUNTER — Other Ambulatory Visit: Payer: Self-pay | Admitting: Family Medicine

## 2022-09-11 DIAGNOSIS — E119 Type 2 diabetes mellitus without complications: Secondary | ICD-10-CM

## 2022-09-11 NOTE — Telephone Encounter (Signed)
Called and spoke with pt and he states [facility] is making him lose weight and he does not want to lose weight, he would like to know if there is something else that can be prescribed.

## 2022-09-11 NOTE — Telephone Encounter (Signed)
Patient requests to be called [facility]

## 2022-09-13 ENCOUNTER — Other Ambulatory Visit: Payer: Self-pay | Admitting: Family Medicine

## 2022-09-13 DIAGNOSIS — E139 Other specified diabetes mellitus without complications: Secondary | ICD-10-CM

## 2022-09-17 NOTE — Telephone Encounter (Signed)
Called and LVM informing pt of PCP request. I offered next opening on 8/2 @ 3:20 pm, but stated couldn't hold this time if no call back received.

## 2022-09-17 NOTE — Telephone Encounter (Signed)
His numbers are better than I expected-would he be willing to wait until August 13 and continue current regimen?  There are other options but honestly it is going to take a while to go through those together in person-he is still taking metformin correct?  Is he on glipizide?

## 2022-09-17 NOTE — Telephone Encounter (Signed)
Please see if pt is willing to move up OV to discuss weight loss with Dr. Durene Cal.

## 2022-09-17 NOTE — Telephone Encounter (Signed)
Pt called back in with more readings. Fasting :  118, 115, 110, & 123 this morning

## 2022-09-17 NOTE — Telephone Encounter (Signed)
Pt called back but was unable to come in on the days offered. Will call back again

## 2022-09-17 NOTE — Telephone Encounter (Signed)
Called and spoke with pt and below message given he gave a few random readings of 155, 186, 172,173 and 144., pt states he has made you aware several times that he does not want to be on insulin so that is not an option for him, he wants to await your response before placing referral to endo since insulin is NOT an option for him.

## 2022-09-17 NOTE — Telephone Encounter (Signed)
See additional readings.

## 2022-09-18 NOTE — Telephone Encounter (Signed)
Called and spoke with pt and pt IS taking Metformin and Jardiance, he states you took him off glipizide and yes he is ok with waiting until August 13th for his appointment.

## 2022-09-19 NOTE — Telephone Encounter (Signed)
Ok that's fine- continue current medications metformin and jardiance until visit (don't have to call as sounds like that's what he is doing)

## 2022-09-20 ENCOUNTER — Ambulatory Visit: Payer: Medicare PPO | Admitting: Family Medicine

## 2022-09-21 ENCOUNTER — Other Ambulatory Visit: Payer: Self-pay | Admitting: Family Medicine

## 2022-10-01 ENCOUNTER — Encounter: Payer: Self-pay | Admitting: Family

## 2022-10-01 ENCOUNTER — Ambulatory Visit (INDEPENDENT_AMBULATORY_CARE_PROVIDER_SITE_OTHER): Payer: Medicare PPO | Admitting: Family

## 2022-10-01 VITALS — BP 120/68 | HR 65 | Temp 98.2°F | Ht 77.5 in | Wt 207.2 lb

## 2022-10-01 DIAGNOSIS — T63481A Toxic effect of venom of other arthropod, accidental (unintentional), initial encounter: Secondary | ICD-10-CM

## 2022-10-01 DIAGNOSIS — L299 Pruritus, unspecified: Secondary | ICD-10-CM | POA: Diagnosis not present

## 2022-10-01 MED ORDER — HYDROXYZINE HCL 10 MG PO TABS
10.0000 mg | ORAL_TABLET | Freq: Three times a day (TID) | ORAL | 0 refills | Status: DC | PRN
Start: 2022-10-01 — End: 2023-10-17

## 2022-10-01 MED ORDER — TRIAMCINOLONE ACETONIDE 0.1 % EX CREA: 1.0000 | TOPICAL_CREAM | Freq: Two times a day (BID) | CUTANEOUS | 0 refills | Status: DC

## 2022-10-01 NOTE — Progress Notes (Signed)
Patient ID: Mark Cunas Sr., male    DOB: [birth year], 72 y.o.   MRN: [id number]  Chief Complaint  Patient presents with   Insect Bite    Bee sting over the weekend. In the back of his left leg and left elbow and right arm. Use benadryl and hydrocortisone cream.     HPI:    Insect stings:    stung several times after mowers ran over a nest of bees or wasps, he is unsure which. Stung on right arm, left arm and left leg. All places are very itchy and the itching spreads further away from the bite. He also reports swelling, inflammation and hardness around the bites. Denies any tingling or numbness, no fever. Denies any  known allergy to bee stings.     Assessment & Plan:  1. Itching sending Hydroxyzine and steroid cream, advised on use & SE. Use a lotion/cream mixed in equal parts with steroid cream. OK to continue using with benadryl cream if helping.  - hydrOXYzine (ATARAX) 10 MG tablet; Take 1-2 tablets (10-20 mg total) by mouth 3 (three) times daily as needed for itching.  Dispense: 30 tablet; Refill: 0 - triamcinolone cream (KENALOG) 0.1 %; Apply 1 Application topically 2 (two) times daily. Mix small amount equally with hand or body lotion/cream for itching.  Dispense: 30 g; Refill: 0  2. Insect stings, accidental or unintentional, initial encounter bites on left lateral leg, left arm, right arm, 4 bites total, all with 1.5-2cm ring of mild erythema, no warmth, & mild induration. Advised pt may take 1-2 weeks to completely heal. RTO precautions provided.  - hydrOXYzine (ATARAX) 10 MG tablet; Take 1-2 tablets (10-20 mg total) by mouth 3 (three) times daily as needed for itching.  Dispense: 30 tablet; Refill: 0    Subjective:    Outpatient Medications Prior to Visit  Medication Sig Dispense Refill   ACCU-CHEK GUIDE test strip USE AS INSTRUCTED 100 strip 5   Accu-Chek Softclix Lancets lancets USE AS DIRECTED 100 each 5   acetaminophen (TYLENOL) 500 MG tablet Take 1,000 mg  by mouth every 6 (six) hours as needed for moderate pain.     Ascorbic Acid (VITAMIN C PO) Take 1 tablet by mouth daily.     b complex vitamins capsule Take 1 capsule by mouth daily.     Blood Glucose Monitoring Suppl (ACCU-CHEK GUIDE ME) w/Device KIT Use as directed 1 kit 1   CRANBERRY PO Take 1 capsule by mouth daily.     docusate sodium (COLACE) 100 MG capsule Take 1 capsule (100 mg total) by mouth daily as needed for up to 30 doses. 30 capsule 0   doxycycline (VIBRA-TABS) 100 MG tablet Take 1 tablet (100 mg total) by mouth 2 (two) times daily. 20 tablet 0   Ferrous Sulfate (IRON PO) Take 1 tablet by mouth daily.     gabapentin (NEURONTIN) 100 MG capsule TAKE 2 CAPSULES BY MOUTH AT BEDTIME 180 capsule 0   GARLIC PO Take 1 capsule by mouth daily.     gentamicin cream (GARAMYCIN) 0.1 % Apply 1 Application topically 2 (two) times daily. 30 g 1   hydrochlorothiazide (HYDRODIURIL) 25 MG tablet TAKE 1 TABLET BY MOUTH EVERY DAY 90 tablet 0   JARDIANCE 10 MG TABS tablet TAKE 1 TABLET BY MOUTH DAILY BEFORE BREAKFAST. 30 tablet 5   ketoconazole (NIZORAL) 2 % cream Apply 1 Application topically daily. For 2 weeks- if not improved- can stop and let me know 60  g 2   metFORMIN (GLUCOPHAGE) 1000 MG tablet TAKE 1 TABLET BY MOUTH TWICE A DAY WITH FOOD 180 tablet 0   metoprolol succinate (TOPROL-XL) 100 MG 24 hr tablet TAKE 1 TABLET BY MOUTH WITH OR IMMEDIATELY FOLLOWING A MEAL. 90 tablet 0   Naphazoline-Pheniramine (VISINE-A OP) Place 1 drop into both eyes daily as needed (dry eyes).     sildenafil (REVATIO) 20 MG tablet Take 1-5 tablets as needed. 45 tablet 5   sildenafil (VIAGRA) 100 MG tablet Take 1/2 to 1 tablet as needed for erectile dysfunction up to every 48 hours 10 tablet 5   simvastatin (ZOCOR) 10 MG tablet TAKE 1 TABLET BY MOUTH EVERY DAY 90 tablet 3   TURMERIC PO Take 1 capsule by mouth daily.     VITAMIN D PO Take 1 capsule by mouth daily.     No facility-administered medications prior to  visit.   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  Constitutional:      General: He is not in acute distress.    Appearance: Normal appearance.  HENT:     Head: Normocephalic.  Cardiovascular:     Rate and Rhythm: Normal rate and regular rhythm.  Pulmonary:     Effort: Pulmonary effort is normal.     Breath sounds: Normal breath sounds.  Musculoskeletal:        General: Normal range of motion.     Cervical back: Normal range of motion.  Skin:    General: Skin is warm and dry.     Findings: Lesion (pinpoint bites on right arm, left arm, 2 on left lateral leg all with mild erythema & induration) present.  Neurological:     Mental Status: He is alert and oriented to person, place, and time.  Psychiatric:        Mood and Affect: Mood normal.    BP 120/68   Pulse 65   Temp 98.2 F (36.8 C)   Ht 6' 5.5" (1.969 m)   Wt 207 lb 3.2 oz (94 kg)   SpO2 97%   BMI 24.25 kg/m  Wt Readings from Last 3 Encounters:  10/01/22 207 lb 3.2 oz (94 kg)  02/13/22 207 lb (93.9 kg)  12/25/21 213 lb (96.6 kg)       Dulce Sellar, NP

## 2022-10-03 ENCOUNTER — Encounter (INDEPENDENT_AMBULATORY_CARE_PROVIDER_SITE_OTHER): Payer: Self-pay

## 2022-10-15 DIAGNOSIS — H903 Sensorineural hearing loss, bilateral: Secondary | ICD-10-CM | POA: Diagnosis not present

## 2022-10-15 DIAGNOSIS — H9313 Tinnitus, bilateral: Secondary | ICD-10-CM | POA: Diagnosis not present

## 2022-10-16 ENCOUNTER — Ambulatory Visit: Payer: Medicare PPO | Admitting: Family Medicine

## 2022-10-17 LAB — HM DIABETES EYE EXAM

## 2022-10-18 ENCOUNTER — Encounter (INDEPENDENT_AMBULATORY_CARE_PROVIDER_SITE_OTHER): Payer: Self-pay

## 2022-10-29 ENCOUNTER — Other Ambulatory Visit: Payer: Self-pay | Admitting: Family Medicine

## 2022-11-01 ENCOUNTER — Other Ambulatory Visit: Payer: Self-pay | Admitting: Family Medicine

## 2022-11-12 ENCOUNTER — Ambulatory Visit: Payer: Medicare PPO | Admitting: Family Medicine

## 2022-12-04 ENCOUNTER — Other Ambulatory Visit: Payer: Self-pay | Admitting: Family Medicine

## 2022-12-05 ENCOUNTER — Other Ambulatory Visit: Payer: Self-pay

## 2022-12-05 MED ORDER — GABAPENTIN 100 MG PO CAPS: 200.0000 mg | ORAL_CAPSULE | Freq: Every day | ORAL | 0 refills | Status: DC

## 2022-12-12 ENCOUNTER — Other Ambulatory Visit: Payer: Self-pay | Admitting: Family Medicine

## 2022-12-12 DIAGNOSIS — E139 Other specified diabetes mellitus without complications: Secondary | ICD-10-CM

## 2022-12-18 ENCOUNTER — Other Ambulatory Visit: Payer: Self-pay | Admitting: Family Medicine

## 2023-01-11 ENCOUNTER — Ambulatory Visit (INDEPENDENT_AMBULATORY_CARE_PROVIDER_SITE_OTHER): Payer: Medicare PPO | Admitting: Family Medicine

## 2023-01-11 ENCOUNTER — Ambulatory Visit (INDEPENDENT_AMBULATORY_CARE_PROVIDER_SITE_OTHER): Payer: Medicare PPO

## 2023-01-11 ENCOUNTER — Other Ambulatory Visit: Payer: Self-pay

## 2023-01-11 ENCOUNTER — Encounter: Payer: Self-pay | Admitting: Family Medicine

## 2023-01-11 VITALS — BP 116/78 | Ht 77.5 in | Wt 207.0 lb

## 2023-01-11 DIAGNOSIS — M19022 Primary osteoarthritis, left elbow: Secondary | ICD-10-CM | POA: Diagnosis not present

## 2023-01-11 DIAGNOSIS — M25561 Pain in right knee: Secondary | ICD-10-CM | POA: Diagnosis not present

## 2023-01-11 DIAGNOSIS — M25522 Pain in left elbow: Secondary | ICD-10-CM

## 2023-01-11 DIAGNOSIS — M170 Bilateral primary osteoarthritis of knee: Secondary | ICD-10-CM

## 2023-01-11 DIAGNOSIS — M778 Other enthesopathies, not elsewhere classified: Secondary | ICD-10-CM | POA: Diagnosis not present

## 2023-01-11 DIAGNOSIS — M1711 Unilateral primary osteoarthritis, right knee: Secondary | ICD-10-CM | POA: Diagnosis not present

## 2023-01-11 DIAGNOSIS — M25562 Pain in left knee: Secondary | ICD-10-CM | POA: Diagnosis not present

## 2023-01-11 DIAGNOSIS — M25422 Effusion, left elbow: Secondary | ICD-10-CM | POA: Diagnosis not present

## 2023-01-11 DIAGNOSIS — M1712 Unilateral primary osteoarthritis, left knee: Secondary | ICD-10-CM | POA: Diagnosis not present

## 2023-01-11 NOTE — Patient Instructions (Addendum)
I will have everything set up Read about shockwave Voltaren or Arnica Take 325 of Tylenol 3x a day See me again in 2 months

## 2023-01-11 NOTE — Assessment & Plan Note (Signed)
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  Patient feels like he is making some improvement at this time.  Does have some calcific tendinitis of the tricep and we did discuss potential shockwave therapy for this area.  Patient would like to try to do the home exercises first and see how he responds and will call us back if he would like to do this.  Will follow-up again in 2 to 3 months otherwise

## 2023-01-11 NOTE — Progress Notes (Signed)
[facility] [address] [place] [address] Phone: [phone] Subjective:   [facility], am serving as a scribe for Dr. Antoine Ali.  I'm seeing this patient by the request  of:  Mark Majestic, MD  CC: left elbow pain   JOA:[id number]  Mark Music Sr. is a 72 y.o. male coming in with complaint of left elbow pain.  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  Also has some proximal radial osteoarthritic changes. Pain in olecranon and into the lateral and medial forearm. Unsure if he hit elbow on something. Unable to fully extend elbow.   Also c/o B knee pain. Has been favoring L knee and R knee is painful.    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 Wife and son patient here. 2 grandkids.       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    [employer] undergrad   1978 [employer]- 4 degrees through 2000- [employer], math. 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       [employer]- pretty involved there- deacon, helps with education.    Social Determinants of Health   Financial Resource Strain: Low Risk  (12/25/2021)   Overall Financial Resource Strain (CARDIA)    Difficulty of Paying Living Expenses: Not hard at all  Food Insecurity: No Food Insecurity (12/25/2021)   Hunger Vital Sign    Worried About Running Out of Food in the Last Year: Never true    Ran Out of Food in the Last Year: Never true  Transportation Needs: No Transportation Needs (12/25/2021)   PRAPARE - Administrator, Civil Service (Medical): No    Lack of Transportation (Non-Medical): No  Physical Activity: Sufficiently Active (12/25/2021)   Exercise Vital Sign    Days of Exercise per Week: 3 days  Minutes of Exercise per Session: 50 min  Stress: No Stress Concern Present (12/25/2021)   [facility] [employer] Occupational Health - Occupational Stress Questionnaire    Feeling of Stress : Not at all  Social Connections: Socially Integrated (12/25/2021)   Social Connection and Isolation Panel [NHANES]    Frequency of Communication with Friends and Family: Three times a week    Frequency of Social Gatherings with Friends and Family: More than three times a week    Attends Religious Services: More than 4 times per year    Active Member of [employer] or Organizations: Yes    Attends Banker Meetings: 1 to 4 times per  year    Marital Status: Married   No Known Allergies Family History  Problem Relation Age of Onset   Diabetes Other    Kidney disease Other    Other Mother        respiratory failure died in 46s   Alcohol abuse Father    Lung disease Father        smoking   Heart disease Sister        heart attack 58   Alcohol abuse Brother    Cirrhosis Brother    Kidney disease Sister    COPD Sister        led to death. procedural complication   Diabetes Brother     Current Outpatient Medications (Endocrine & Metabolic):    JARDIANCE 10 MG TABS tablet, TAKE 1 TABLET BY MOUTH EVERY DAY BEFORE BREAKFAST   metFORMIN (GLUCOPHAGE) 1000 MG tablet, TAKE 1 TABLET BY MOUTH TWICE A DAY WITH FOOD  Current Outpatient Medications (Cardiovascular):    hydrochlorothiazide (HYDRODIURIL) 25 MG tablet, TAKE 1 TABLET BY MOUTH EVERY DAY   metoprolol succinate (TOPROL-XL) 100 MG 24 hr tablet, TAKE 1 TABLET BY MOUTH WITH OR IMMEDIATELY FOLLOWING A MEAL.   sildenafil (REVATIO) 20 MG tablet, Take 1-5 tablets as needed.   sildenafil (VIAGRA) 100 MG tablet, Take 1/2 to 1 tablet as needed for erectile dysfunction up to every 48 hours   simvastatin (ZOCOR) 10 MG tablet, TAKE 1 TABLET BY MOUTH EVERY DAY   Current Outpatient Medications (Analgesics):    acetaminophen (TYLENOL) 500 MG tablet, Take 1,000 mg by mouth every 6 (six) hours as needed for moderate pain.  Current Outpatient Medications (Hematological):    Ferrous Sulfate (IRON PO), Take 1 tablet by mouth daily.  Current Outpatient Medications (Other):    ACCU-CHEK GUIDE test strip, USE AS INSTRUCTED   Accu-Chek Softclix Lancets lancets, USE AS DIRECTED   Ascorbic Acid (VITAMIN C PO), Take 1 tablet by mouth daily.   b complex vitamins capsule, Take 1 capsule by mouth daily.   Blood Glucose Monitoring Suppl (ACCU-CHEK GUIDE ME) w/Device KIT, Use as directed   CRANBERRY PO, Take 1 capsule by mouth daily.   docusate sodium (COLACE) 100 MG capsule, Take 1  capsule (100 mg total) by mouth daily as needed for up to 30 doses.   doxycycline (VIBRA-TABS) 100 MG tablet, Take 1 tablet (100 mg total) by mouth 2 (two) times daily.   gabapentin (NEURONTIN) 100 MG capsule, Take 2 capsules (200 mg total) by mouth at bedtime.   GARLIC PO, Take 1 capsule by mouth daily.   gentamicin cream (GARAMYCIN) 0.1 %, Apply 1 Application topically 2 (two) times daily.   hydrOXYzine (ATARAX) 10 MG tablet, Take 1-2 tablets (10-20 mg total) by mouth 3 (three) times daily as needed for itching.   ketoconazole (NIZORAL) 2 %  cream, Apply 1 Application topically daily. For 2 weeks- if not improved- can stop and let me know   Naphazoline-Pheniramine (VISINE-A OP), Place 1 drop into both eyes daily as needed (dry eyes).   triamcinolone cream (KENALOG) 0.1 %, Apply 1 Application topically 2 (two) times daily. Mix small amount equally with hand or body lotion/cream for itching.   TURMERIC PO, Take 1 capsule by mouth daily.   VITAMIN D PO, Take 1 capsule by mouth daily.   Reviewed prior external information including notes and imaging from  primary care provider As well as notes that were available from care everywhere and other healthcare systems.  Past medical history, social, surgical and family history all reviewed in electronic medical record.  No pertanent information unless stated regarding to the chief complaint.   Review of Systems:  No headache, visual changes, nausea, vomiting, diarrhea, constipation, dizziness, abdominal pain, skin rash, fevers, chills, night sweats, weight loss, swollen lymph nodes, body aches, joint swelling, chest pain, shortness of breath, mood changes. POSITIVE muscle aches  Objective  Blood pressure 116/78, height 6' 5.5" (1.969 m), weight 207 lb (93.9 kg).   General: No apparent distress alert and oriented x3 mood and affect normal, dressed appropriately.  HEENT: Pupils equal, extraocular movements intact  Respiratory: Patient's speak in full  sentences and does not appear short of breath  Cardiovascular: No lower extremity edema, non tender, no erythema  Antalgic gait noted.  Patient has had some difficulty with walking.  Tender to palpation over the right knee with some varus deformity noted.  Tender to palpation over the medial joint line with some crepitus.  Left elbow does have some limited range of motion.  Patient does have lacking the last 10 degrees of extension.  Also lacks last 5 degrees of rotation and flexion.    Impression and Recommendations:     The above documentation has been reviewed and is accurate and complete Judi Saa, DO

## 2023-01-11 NOTE — Assessment & Plan Note (Addendum)
Most recent x-rays taken today did show that there has been fairly significant progression of the arthritis right greater than left at the moment and seems to be in the medial compartment bilaterally.  Patient does have the strap metal from [place] on the left leg.  This could have increase the likelihood of the progression of the arthritis on the right knee and by receive the bone-on-bone arthritic changes at this time.  We discussed with patient about icing regimen and home exercises otherwise.  Discussed avoiding certain activities.  Discussed icing regimen.  Patient wants to hold on any injections such as the possibility of the viscosupplementation.  Follow-up with me again in 8 to 12 weeks otherwise.

## 2023-01-15 ENCOUNTER — Telehealth: Payer: Self-pay | Admitting: Family Medicine

## 2023-01-15 NOTE — Telephone Encounter (Signed)
Paperwork filled out and handed to pt.

## 2023-01-15 NOTE — Telephone Encounter (Signed)
Patient dropped off document Handicap Placard, to be filled out by provider. Patient requested to send it back via Call Patient to pick up within 5-days. Document is located in providers tray at front office.Please advise at Mobile [phone] (mobile)

## 2023-01-21 ENCOUNTER — Telehealth: Payer: Self-pay | Admitting: Family Medicine

## 2023-01-21 NOTE — Telephone Encounter (Signed)
Patient  would like a call re: his appointment

## 2023-01-22 ENCOUNTER — Ambulatory Visit (INDEPENDENT_AMBULATORY_CARE_PROVIDER_SITE_OTHER): Payer: Medicare PPO

## 2023-01-22 ENCOUNTER — Other Ambulatory Visit: Payer: Self-pay | Admitting: Family Medicine

## 2023-01-22 VITALS — Wt 207.0 lb

## 2023-01-22 DIAGNOSIS — Z1211 Encounter for screening for malignant neoplasm of colon: Secondary | ICD-10-CM | POA: Diagnosis not present

## 2023-01-22 DIAGNOSIS — Z0000 Encounter for general adult medical examination without abnormal findings: Secondary | ICD-10-CM

## 2023-01-22 NOTE — Progress Notes (Signed)
Subjective:   Mark Cifuentes Sr. is a 72 y.o. male who presents for Medicare Annual/Subsequent preventive examination.  Visit Complete: Virtual I connected with  Mark Cunas Sr. on 01/22/23 by a audio enabled telemedicine application and verified that I am speaking with the correct person using two identifiers.  Patient Location: Home  Provider Location: Office/Clinic  I discussed the limitations of evaluation and management by telemedicine. The patient expressed understanding and agreed to proceed.  Vital Signs: Because this visit was a virtual/telehealth visit, some criteria may be missing or patient reported. Any vitals not documented were not able to be obtained and vitals that have been documented are patient reported.    Cardiac Risk Factors include: advanced age (>99men, >5 women);male gender;hypertension;diabetes mellitus     Objective:    Today's Vitals   01/22/23 1407  Weight: 207 lb (93.9 kg)   Body mass index is 24.23 kg/m.     01/22/2023    2:17 PM 12/25/2021   10:21 AM 01/18/2021    8:53 AM 01/17/2021   10:37 AM 10/14/2020    3:08 PM 03/21/2020   10:06 AM 01/08/2018    3:16 PM  Advanced Directives  Does Patient Have a Medical Advance Directive? Yes Yes No No No No Yes  Type of Estate agent of [place];Living will Healthcare Power of [place];Living will     Healthcare Power of Attorney  Does patient want to make changes to medical advance directive?       No - Patient declined  Copy of Healthcare Power of Attorney in Chart? No - copy requested No - copy requested       Would patient like information on creating a medical advance directive?   No - Patient declined  No - Patient declined      Current Medications (verified) Outpatient Encounter Medications as of 01/22/2023  Medication Sig   ACCU-CHEK GUIDE test strip USE AS INSTRUCTED   Accu-Chek Softclix Lancets lancets USE AS DIRECTED   acetaminophen (TYLENOL) 500  MG tablet Take 1,000 mg by mouth every 6 (six) hours as needed for moderate pain.   Ascorbic Acid (VITAMIN C PO) Take 1 tablet by mouth daily.   b complex vitamins capsule Take 1 capsule by mouth daily.   Blood Glucose Monitoring Suppl (ACCU-CHEK GUIDE ME) w/Device KIT Use as directed   CRANBERRY PO Take 1 capsule by mouth daily.   docusate sodium (COLACE) 100 MG capsule Take 1 capsule (100 mg total) by mouth daily as needed for up to 30 doses.   doxycycline (VIBRA-TABS) 100 MG tablet Take 1 tablet (100 mg total) by mouth 2 (two) times daily.   Ferrous Sulfate (IRON PO) Take 1 tablet by mouth daily.   gabapentin (NEURONTIN) 100 MG capsule Take 2 capsules (200 mg total) by mouth at bedtime.   GARLIC PO Take 1 capsule by mouth daily.   gentamicin cream (GARAMYCIN) 0.1 % Apply 1 Application topically 2 (two) times daily.   hydrochlorothiazide (HYDRODIURIL) 25 MG tablet TAKE 1 TABLET BY MOUTH EVERY DAY   hydrOXYzine (ATARAX) 10 MG tablet Take 1-2 tablets (10-20 mg total) by mouth 3 (three) times daily as needed for itching.   ketoconazole (NIZORAL) 2 % cream Apply 1 Application topically daily. For 2 weeks- if not improved- can stop and let me know   metFORMIN (GLUCOPHAGE) 1000 MG tablet TAKE 1 TABLET BY MOUTH TWICE A DAY WITH FOOD   metoprolol succinate (TOPROL-XL) 100 MG 24 hr tablet TAKE  1 TABLET BY MOUTH WITH OR IMMEDIATELY FOLLOWING A MEAL.   Naphazoline-Pheniramine (VISINE-A OP) Place 1 drop into both eyes daily as needed (dry eyes).   sildenafil (REVATIO) 20 MG tablet Take 1-5 tablets as needed.   sildenafil (VIAGRA) 100 MG tablet Take 1/2 to 1 tablet as needed for erectile dysfunction up to every 48 hours   simvastatin (ZOCOR) 10 MG tablet TAKE 1 TABLET BY MOUTH EVERY DAY   triamcinolone cream (KENALOG) 0.1 % Apply 1 Application topically 2 (two) times daily. Mix small amount equally with hand or body lotion/cream for itching.   TURMERIC PO Take 1 capsule by mouth daily.   VITAMIN D PO Take  1 capsule by mouth daily.   JARDIANCE 10 MG TABS tablet TAKE 1 TABLET BY MOUTH EVERY DAY BEFORE BREAKFAST (Patient not taking: Reported on 01/22/2023)   No facility-administered encounter medications on file as of 01/22/2023.    Allergies (verified) Patient has no known allergies.   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 procedural complication   Diabetes Brother    Social History   Socioeconomic History   Marital status: Married    Spouse name: Not on file   Number of children: Not on file   Years of education: Not on file   Highest education level: Not on file  Occupational History   Not on file  Tobacco Use   Smoking status: Never   Smokeless tobacco: Never  Vaping Use   Vaping status: Never Used  Substance and Sexual Activity   Alcohol use: No   Drug use: No   Sexual activity: Yes  Other Topics Concern   Not on file  Social History Narrative   Married 3 children. Wife and son patient here. 2 grandkids.       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    [employer] undergrad   1978 [employer]- 4 degrees through 2000- [employer], math. Grad Print production planner, doctorate - Building surveyor for 2 years. [employer] -  2 months.       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    Social Determinants of Health   Financial Resource Strain: Low Risk  (01/22/2023)   Overall Financial Resource Strain (CARDIA)    Difficulty of Paying Living Expenses: Not hard at all  Food Insecurity: No Food Insecurity (01/22/2023)   Hunger Vital Sign    Worried About Running Out of Food in the Last Year: Never true    Ran Out of Food in the Last Year: Never true  Transportation Needs: No Transportation Needs (01/22/2023)   PRAPARE - Administrator, Civil Service (Medical): No    Lack of Transportation (Non-Medical): No  Physical Activity: Sufficiently Active (01/22/2023)   Exercise Vital Sign    Days of Exercise per Week: 3 days    Minutes of Exercise per Session: 60 min  Stress: No Stress Concern Present (01/22/2023)   [employer] of Occupational Health - Occupational Stress Questionnaire    Feeling of Stress : Not at all  Social Connections: Socially Integrated (01/22/2023)   Social Connection and Isolation Panel [NHANES]    Frequency of Communication with Friends and Family: More than three times a week    Frequency of Social Gatherings with Friends and Family: More than three times a week    Attends Religious Services: More than 4 times per year    Active Member of [employer] or Organizations: Yes    Attends Banker Meetings: 1 to 4 times per  year    Marital Status: Married    Tobacco Counseling Counseling given: Not Answered   Clinical Intake:  Pre-visit preparation completed: Yes  Pain : No/denies pain     BMI - recorded: 24.23 Nutritional Status: BMI of 19-24  Normal Nutritional Risks: None Diabetes: Yes CBG done?: Yes (126 per pt) CBG resulted in Enter/ Edit results?: No Did pt. bring in CBG monitor from home?: No  How often do you need to have someone help you when you read instructions, pamphlets, or other written materials from your doctor or pharmacy?: 1 - Never  Interpreter Needed?: No  Information entered by :: Lorine Bears   Activities of Daily Living    01/22/2023    2:08 PM  In your present state of health, do you have any difficulty performing the following activities:  Hearing? 0  Vision? 0  Difficulty concentrating or making decisions? 0  Walking or climbing stairs? 0  Dressing or bathing? 0  Doing errands, shopping? 0  Preparing Food and eating ? N  Using the Toilet? N  In the past six months, have you accidently leaked urine? N  Do you have problems with loss of bowel control? N  Managing your Medications? N  Managing your Finances? N  Housekeeping or managing your Housekeeping? N    Patient Care Team: Shelva Majestic, MD as PCP - General (Family Medicine)  Indicate any recent Medical Services you may have received from other than [facility] providers in the past year (date may be approximate).     Assessment:   This is a routine wellness examination for [place].  Hearing/Vision screen Hearing Screening - Comments:: Pt denies any hearing issues  Vision Screening - Comments:: Pt [facility] for annual [facility] exams    Goals Addressed             This Visit's Progress    Patient Stated       Maintain  health and activity        Depression Screen    01/22/2023    2:18 PM 10/01/2022    2:03 PM 12/25/2021   10:18 AM 11/02/2021    2:05 PM  10/14/2020    3:07 PM 09/09/2020    2:19 PM 10/15/2019    9:58 AM  PHQ 2/9 Scores  PHQ - 2 Score 0 0 0 0 0 0 0  PHQ- 9 Score  0 0 0   0    Fall Risk    01/22/2023    2:20 PM 10/01/2022    2:03 PM 10/01/2022    1:07 PM 12/25/2021   10:22 AM 11/02/2021    2:02 PM  Fall Risk   Falls in the past year? 0 0 0 0 0  Number falls in past yr: 0 0 0 0 0  Injury with Fall? 0 0 0 0 0  Risk for fall due to : No Fall Risks   Impaired vision No Fall Risks  Follow up Falls prevention discussed   Falls prevention discussed Falls evaluation completed    MEDICARE RISK AT HOME: Medicare Risk at Home Any stairs in or around the home?: Yes If so, are there any without handrails?: No Home free of loose throw rugs in walkways, pet beds, electrical cords, etc?: Yes Adequate lighting in your home to reduce risk of falls?: Yes Life alert?: Yes Use of a cane, walker or w/c?: No Grab bars in the bathroom?: No Shower chair or bench in shower?: No Elevated toilet seat or a handicapped toilet?: No  TIMED UP AND GO:  Was the test performed?  No    Cognitive Function:        01/22/2023    2:25 PM 12/25/2021   10:25 AM 10/14/2020    3:09 PM 01/08/2018    3:39 PM  6CIT Screen  What Year? 0 points 0 points 0 points 0 points  What month? 0 points 0 points 0 points 0 points  What time? 0 points 0 points 0 points 0 points  Count back from 20 0 points 0 points 0 points 0 points  Months in reverse 0 points 0 points 0 points 0 points  Repeat phrase 0 points 0 points 0 points 0 points  Total Score 0 points 0 points 0 points 0 points    Immunizations Immunization History  Administered Date(s) Administered   Fluad Quad(high Dose 65+) 10/31/2018, 11/05/2019, 11/23/2021   Influenza Split 03/05/2011   Influenza Whole 03/05/2005   Influenza, High Dose Seasonal PF 02/21/2017, 01/08/2018, 11/17/2020, 11/13/2022   Influenza,inj,Quad PF,6+ Mos 04/29/2014   Moderna Sars-Covid-2 Vaccination 04/16/2019, 05/14/2019,  12/10/2019, 08/10/2020   PNEUMOCOCCAL CONJUGATE-20 09/20/2022   Pfizer Covid-19 Vaccine Bivalent Booster 62yrs & up 11/17/2020   Pfizer(Comirnaty)Fall Seasonal Vaccine 12 years and older 11/13/2022   Pneumococcal Conjugate-13 09/01/2014   Pneumococcal Polysaccharide-23 03/05/2006, 01/04/2015, 02/02/2016   Respiratory Syncytial Virus Vaccine,Recomb Aduvanted(Arexvy) 02/14/2022   Td 03/06/1999   Tdap 03/05/2011, 03/02/2013   Unspecified SARS-COV-2 Vaccination 11/23/2021   Zoster Recombinant(Shingrix) 10/01/2020, 08/30/2021   Zoster, Live 09/03/2014    TDAP status: Up to date  Flu Vaccine status: Up to date  Pneumococcal vaccine status: Up to date  Covid-19 vaccine status: Completed vaccines  Qualifies for Shingles Vaccine? Yes   Zostavax completed Yes   Shingrix Completed?: Yes  Screening Tests Health Maintenance  Topic Date Due   HEMOGLOBIN A1C  08/15/2022   Diabetic kidney evaluation - eGFR measurement  02/14/2023  Diabetic kidney evaluation - Urine ACR  02/14/2023   Colonoscopy  02/14/2023 (Originally 02/10/1994)   DTaP/Tdap/Td (4 - Td or Tdap) 03/03/2023   FOOT EXAM  06/25/2023   OPHTHALMOLOGY EXAM  10/17/2023   Medicare Annual Wellness (AWV)  01/22/2024   Pneumonia Vaccine 66+ Years old  Completed   INFLUENZA VACCINE  Completed   COVID-19 Vaccine  Completed   Hepatitis C Screening  Completed   Zoster Vaccines- Shingrix  Completed   HPV VACCINES  Aged Out    Health Maintenance  Health Maintenance Due  Topic Date Due   HEMOGLOBIN A1C  08/15/2022   Diabetic kidney evaluation - eGFR measurement  02/14/2023   Diabetic kidney evaluation - Urine ACR  02/14/2023    Colorectal cancer screening: Referral to GI placed 01/22/23. Pt aware the office will call re: appt.   Additional Screening:  Hepatitis C Screening:  Completed 03/14/17  Vision Screening: Recommended annual ophthalmology exams for early detection of glaucoma and other disorders of the eye. Is the  patient up to date with their annual eye exam?  Yes  Who is the provider or what is the name of the office in which the patient attends annual eye exams? [facility]  If pt is not established with a provider, would they like to be referred to a provider to establish care? No .   Dental Screening: Recommended annual dental exams for proper oral hygiene  Diabetic Foot Exam: Diabetic Foot Exam: Completed 06/25/22  Community Resource Referral / Chronic Care Management: CRR required this visit?  No   CCM required this visit?  No     Plan:     I have personally reviewed and noted the following in the patient's chart:   Medical and social history Use of alcohol, tobacco or illicit drugs  Current medications and supplements including opioid prescriptions. Patient is not currently taking opioid prescriptions. Functional ability and status Nutritional status Physical activity Advanced directives List of other physicians Hospitalizations, surgeries, and ER visits in previous 12 months Vitals Screenings to include cognitive, depression, and falls Referrals and appointments  In addition, I have reviewed and discussed with patient certain preventive protocols, quality metrics, and best practice recommendations. A written personalized care plan for preventive services as well as general preventive health recommendations were provided to patient.     Marzella Schlein, LPN   16/12/9602   After Visit Summary: (MyChart) Due to this being a telephonic visit, the after visit summary with patients personalized plan was offered to patient via MyChart   Nurse Notes: Pt stated he has not been taking jardiance 10 mg related to no refills, I see refills at [facility] listed. Pt also stated because he didn't have jardiance he started back on the glipizide he had . I informed pt that altering medication without notifying your Doctor can have concerning effects . Pt stated he was going to my chart you as well. Please  advise

## 2023-01-22 NOTE — Patient Instructions (Signed)
[name] , Thank you for taking time to come for your Medicare Wellness Visit. I appreciate your ongoing commitment to your health goals. Please review the following plan we discussed and let me know if I can assist you in the future.   Referrals/Orders/Follow-Ups/Clinician Recommendations: maintain health and activity   This is a list of the screening recommended for you and due dates:  Health Maintenance  Topic Date Due   Hemoglobin A1C  08/15/2022   Medicare Annual Wellness Visit  12/26/2022   Yearly kidney function blood test for diabetes  02/14/2023   Yearly kidney health urinalysis for diabetes  02/14/2023   Colon Cancer Screening  02/14/2023*   DTaP/Tdap/Td vaccine (4 - Td or Tdap) 03/03/2023   Complete foot exam   06/25/2023   Eye exam for diabetics  10/17/2023   Pneumonia Vaccine  Completed   Flu Shot  Completed   COVID-19 Vaccine  Completed   Hepatitis C Screening  Completed   Zoster (Shingles) Vaccine  Completed   HPV Vaccine  Aged Out  *Topic was postponed. The date shown is not the original due date.    Advanced directives: (Copy Requested) Please bring a copy of your health care power of attorney and living will to the office to be added to your chart at your convenience.  Next Medicare Annual Wellness Visit scheduled for next year: Yes

## 2023-01-28 ENCOUNTER — Ambulatory Visit: Payer: Medicare PPO | Admitting: Family Medicine

## 2023-01-30 NOTE — Telephone Encounter (Signed)
Left message for patient to call back  

## 2023-01-30 NOTE — Telephone Encounter (Signed)
Patient called wanting to talk to [facility] about his knees. Would like a call back to discuss.

## 2023-02-04 NOTE — Telephone Encounter (Signed)
Patient called back and left a message after hours asking for a call back to discuss.

## 2023-02-04 NOTE — Telephone Encounter (Signed)
Sent MyChart msg to pt advising to contact the office or schedule via MyChart.

## 2023-02-04 NOTE — Telephone Encounter (Signed)
Patient called back. He also wanted to let Dr Katrinka Blazing know that the pain in his [place] has returned in the back and the front.  He asked if there was something that Dr Katrinka Blazing could prescribe or what further recommendations he might have?  Please advise.

## 2023-02-05 ENCOUNTER — Other Ambulatory Visit: Payer: Self-pay | Admitting: Family Medicine

## 2023-02-05 MED ORDER — GABAPENTIN 100 MG PO CAPS: 200.0000 mg | ORAL_CAPSULE | Freq: Every day | ORAL | 0 refills | Status: DC

## 2023-02-05 MED ORDER — MELOXICAM 7.5 MG PO TABS: 7.5000 mg | ORAL_TABLET | Freq: Every day | ORAL | 0 refills | Status: DC

## 2023-02-19 DIAGNOSIS — N471 Phimosis: Secondary | ICD-10-CM | POA: Diagnosis not present

## 2023-03-04 ENCOUNTER — Ambulatory Visit: Payer: Medicare PPO | Admitting: Family Medicine

## 2023-03-10 ENCOUNTER — Other Ambulatory Visit: Payer: Self-pay | Admitting: Family Medicine

## 2023-03-14 ENCOUNTER — Other Ambulatory Visit: Payer: Self-pay | Admitting: Family Medicine

## 2023-03-25 ENCOUNTER — Ambulatory Visit: Payer: Medicare PPO | Admitting: Family Medicine

## 2023-03-25 ENCOUNTER — Ambulatory Visit: Payer: Self-pay | Admitting: Family Medicine

## 2023-03-25 NOTE — Telephone Encounter (Signed)
Copied from CRM [id number]. Topic: Clinical - Red Word Triage >> Mar 25, 2023 11:26 AM Irine Seal wrote: [facility] that prompted transfer to Nurse Triage: Pts wife stated husband has been in bed since Thursday, pt is lethargic, weak, vomiting, and only sipping liquids. Chills pts wife states no fever present. Pt is refusing urgent care so pts wife is wanting to know what to do from here   Chief Complaint: fatigue and generalized weakness Symptoms: vomiting diarrhea for 2 days Frequency: ongoing since Thursday Pertinent Negatives: Patient denies fever or cold-like symptoms Disposition: [] ED /[] Urgent Care (no appt availability in office) / [x] Appointment(In office/virtual)/ []  [facility]/ [] Home Care/ [] Refused Recommended Disposition /[] [facility]/ []  Follow-up with PCP Additional Notes: The patient's wife reported that the patient experienced chills Thursday night.  He had vomiting and diarrhea Friday and Saturday and has not had any since.  He had a headache Friday and Saturday and took over the counter medication for relief.  He has been sleeping more than usual and he has been eating and drinking less than usual.  His appetite has improved since Saturday as he requested eggs and toast for breakfast today but she is unsure if he ate it.  He reported that he is feeling better but she prefers that he be seen in the office.  He was scheduled for a next day appointment.   Reason for Disposition  Mild weakness or fatigue with acute minor illness (e.g., colds)  Answer Assessment - Initial Assessment Questions 1. DESCRIPTION: "Describe how you are feeling."     Sleeping more than usual  2. SEVERITY: "How bad is it?"  "Can you stand and walk?"   - MILD (0-3): Feels weak or tired, but does not interfere with work, school or normal activities.   - MODERATE (4-7): Able to stand and walk; weakness interferes with work, school, or normal activities.   - SEVERE (8-10): Unable to stand or  walk; unable to do usual activities.     Able to stand and walk  3. ONSET: "When did these symptoms begin?" (e.g., hours, days, weeks, months)     Friday 4. CAUSE: "What do you think is causing the weakness or fatigue?" (e.g., not drinking enough fluids, medical problem, trouble sleeping)     Not drinking or eating much  5. NEW MEDICINES:  "Have you started on any new medicines recently?" (e.g., opioid pain medicines, benzodiazepines, muscle relaxants, antidepressants, antihistamines, neuroleptics, beta blockers)     Unknown  6. OTHER SYMPTOMS: "Do you have any other symptoms?" (e.g., chest pain, fever, cough, SOB, vomiting, diarrhea, bleeding, other areas of pain)     Vomiting and diarrhea two days Friday and Saturday  Protocols used: Weakness (Generalized) and Fatigue-A-AH

## 2023-03-25 NOTE — Telephone Encounter (Signed)
FYI, see Triage note. Pt scheduled tomorrow.

## 2023-03-25 NOTE — Telephone Encounter (Signed)
Keep pushing fluids and glad he will be seen but if worsens seek care even over night in Emergency Department

## 2023-03-26 ENCOUNTER — Encounter (HOSPITAL_COMMUNITY): Payer: Self-pay

## 2023-03-26 ENCOUNTER — Ambulatory Visit: Payer: Medicare PPO | Admitting: Family

## 2023-03-26 ENCOUNTER — Other Ambulatory Visit: Payer: Self-pay

## 2023-03-26 ENCOUNTER — Inpatient Hospital Stay (HOSPITAL_COMMUNITY)
Admission: EM | Admit: 2023-03-26 | Discharge: 2023-04-02 | DRG: 637 | Disposition: A | Discharge status: Skilled Nursing Facility | Payer: Medicare PPO | Attending: Student | Admitting: Student

## 2023-03-26 DIAGNOSIS — N179 Acute kidney failure, unspecified: Secondary | ICD-10-CM | POA: Diagnosis not present

## 2023-03-26 DIAGNOSIS — R1111 Vomiting without nausea: Secondary | ICD-10-CM | POA: Diagnosis not present

## 2023-03-26 DIAGNOSIS — Z811 Family history of alcohol abuse and dependence: Secondary | ICD-10-CM

## 2023-03-26 DIAGNOSIS — Z8249 Family history of ischemic heart disease and other diseases of the circulatory system: Secondary | ICD-10-CM | POA: Diagnosis not present

## 2023-03-26 DIAGNOSIS — R000 Tachycardia, unspecified: Secondary | ICD-10-CM | POA: Diagnosis not present

## 2023-03-26 DIAGNOSIS — Z79899 Other long term (current) drug therapy: Secondary | ICD-10-CM

## 2023-03-26 DIAGNOSIS — Z841 Family history of disorders of kidney and ureter: Secondary | ICD-10-CM

## 2023-03-26 DIAGNOSIS — Z1152 Encounter for screening for COVID-19: Secondary | ICD-10-CM | POA: Diagnosis not present

## 2023-03-26 DIAGNOSIS — E871 Hypo-osmolality and hyponatremia: Secondary | ICD-10-CM | POA: Diagnosis not present

## 2023-03-26 DIAGNOSIS — Z825 Family history of asthma and other chronic lower respiratory diseases: Secondary | ICD-10-CM

## 2023-03-26 DIAGNOSIS — R0902 Hypoxemia: Secondary | ICD-10-CM | POA: Diagnosis not present

## 2023-03-26 DIAGNOSIS — I10 Essential (primary) hypertension: Secondary | ICD-10-CM | POA: Diagnosis present

## 2023-03-26 DIAGNOSIS — D631 Anemia in chronic kidney disease: Secondary | ICD-10-CM | POA: Diagnosis not present

## 2023-03-26 DIAGNOSIS — K529 Noninfective gastroenteritis and colitis, unspecified: Secondary | ICD-10-CM

## 2023-03-26 DIAGNOSIS — N1832 Chronic kidney disease, stage 3b: Secondary | ICD-10-CM | POA: Diagnosis not present

## 2023-03-26 DIAGNOSIS — R6511 Systemic inflammatory response syndrome (SIRS) of non-infectious origin with acute organ dysfunction: Secondary | ICD-10-CM | POA: Diagnosis present

## 2023-03-26 DIAGNOSIS — E785 Hyperlipidemia, unspecified: Secondary | ICD-10-CM | POA: Diagnosis present

## 2023-03-26 DIAGNOSIS — E1110 Type 2 diabetes mellitus with ketoacidosis without coma: Principal | ICD-10-CM

## 2023-03-26 DIAGNOSIS — R531 Weakness: Secondary | ICD-10-CM | POA: Diagnosis not present

## 2023-03-26 DIAGNOSIS — R2689 Other abnormalities of gait and mobility: Secondary | ICD-10-CM | POA: Diagnosis not present

## 2023-03-26 DIAGNOSIS — Y92002 Bathroom of unspecified non-institutional (private) residence single-family (private) house as the place of occurrence of the external cause: Secondary | ICD-10-CM | POA: Diagnosis not present

## 2023-03-26 DIAGNOSIS — E1122 Type 2 diabetes mellitus with diabetic chronic kidney disease: Secondary | ICD-10-CM | POA: Diagnosis present

## 2023-03-26 DIAGNOSIS — E860 Dehydration: Secondary | ICD-10-CM | POA: Diagnosis present

## 2023-03-26 DIAGNOSIS — M6281 Muscle weakness (generalized): Secondary | ICD-10-CM | POA: Diagnosis not present

## 2023-03-26 DIAGNOSIS — E875 Hyperkalemia: Secondary | ICD-10-CM | POA: Diagnosis not present

## 2023-03-26 DIAGNOSIS — B379 Candidiasis, unspecified: Secondary | ICD-10-CM | POA: Diagnosis not present

## 2023-03-26 DIAGNOSIS — Z7984 Long term (current) use of oral hypoglycemic drugs: Secondary | ICD-10-CM | POA: Diagnosis not present

## 2023-03-26 DIAGNOSIS — I129 Hypertensive chronic kidney disease with stage 1 through stage 4 chronic kidney disease, or unspecified chronic kidney disease: Secondary | ICD-10-CM | POA: Diagnosis present

## 2023-03-26 DIAGNOSIS — Z8639 Personal history of other endocrine, nutritional and metabolic disease: Secondary | ICD-10-CM | POA: Diagnosis present

## 2023-03-26 DIAGNOSIS — D638 Anemia in other chronic diseases classified elsewhere: Secondary | ICD-10-CM | POA: Diagnosis not present

## 2023-03-26 DIAGNOSIS — E876 Hypokalemia: Secondary | ICD-10-CM | POA: Diagnosis not present

## 2023-03-26 DIAGNOSIS — E1169 Type 2 diabetes mellitus with other specified complication: Secondary | ICD-10-CM | POA: Diagnosis present

## 2023-03-26 DIAGNOSIS — W010XXA Fall on same level from slipping, tripping and stumbling without subsequent striking against object, initial encounter: Secondary | ICD-10-CM | POA: Diagnosis present

## 2023-03-26 DIAGNOSIS — R739 Hyperglycemia, unspecified: Secondary | ICD-10-CM | POA: Diagnosis not present

## 2023-03-26 DIAGNOSIS — Z833 Family history of diabetes mellitus: Secondary | ICD-10-CM | POA: Diagnosis not present

## 2023-03-26 DIAGNOSIS — E8809 Other disorders of plasma-protein metabolism, not elsewhere classified: Secondary | ICD-10-CM | POA: Diagnosis not present

## 2023-03-26 LAB — CBC
HCT: 40.5 % (ref 39.0–52.0)
Hemoglobin: 12.9 g/dL — ABNORMAL LOW (ref 13.0–17.0)
MCH: 30.6 pg (ref 26.0–34.0)
MCHC: 31.9 g/dL (ref 30.0–36.0)
MCV: 96.2 fL (ref 80.0–100.0)
Platelets: 350 10*3/uL (ref 150–400)
RBC: 4.21 MIL/uL — ABNORMAL LOW (ref 4.22–5.81)
RDW: 13.7 % (ref 11.5–15.5)
WBC: 15.8 10*3/uL — ABNORMAL HIGH (ref 4.0–10.5)
nRBC: 0 % (ref 0.0–0.2)

## 2023-03-26 LAB — CBG MONITORING, ED
Glucose-Capillary: 567 mg/dL (ref 70–99)
Glucose-Capillary: 552 mg/dL (ref 70–99)
Glucose-Capillary: 383 mg/dL — ABNORMAL HIGH (ref 70–99)
Glucose-Capillary: 343 mg/dL — ABNORMAL HIGH (ref 70–99)

## 2023-03-26 LAB — I-STAT VENOUS BLOOD GAS, ED
Acid-base deficit: 5 mmol/L — ABNORMAL HIGH (ref 0.0–2.0)
Bicarbonate: 23.5 mmol/L (ref 20.0–28.0)
Calcium, Ion: 1.1 mmol/L — ABNORMAL LOW (ref 1.15–1.40)
HCT: 42 % (ref 39.0–52.0)
Hemoglobin: 14.3 g/dL (ref 13.0–17.0)
O2 Saturation: 59 %
Potassium: 6.1 mmol/L — ABNORMAL HIGH (ref 3.5–5.1)
Sodium: 134 mmol/L — ABNORMAL LOW (ref 135–145)
TCO2: 25 mmol/L (ref 22–32)
pCO2, Ven: 55 mm[Hg] (ref 44–60)
pH, Ven: 7.238 — ABNORMAL LOW (ref 7.25–7.43)
pO2, Ven: 36 mm[Hg] (ref 32–45)

## 2023-03-26 LAB — BASIC METABOLIC PANEL
Anion gap: 20 — ABNORMAL HIGH (ref 5–15)
BUN: 104 mg/dL — ABNORMAL HIGH (ref 8–23)
CO2: 20 mmol/L — ABNORMAL LOW (ref 22–32)
Calcium: 8.8 mg/dL — ABNORMAL LOW (ref 8.9–10.3)
Chloride: 97 mmol/L — ABNORMAL LOW (ref 98–111)
Creatinine, Ser: 2.83 mg/dL — ABNORMAL HIGH (ref 0.61–1.24)
GFR, Estimated: 23 mL/min — ABNORMAL LOW (ref >60)
Glucose, Bld: 580 mg/dL (ref 70–99)
Potassium: 5.1 mmol/L (ref 3.5–5.1)
Sodium: 137 mmol/L (ref 135–145)

## 2023-03-26 LAB — COMPREHENSIVE METABOLIC PANEL
ALT: 39 U/L (ref 0–44)
AST: 57 U/L — ABNORMAL HIGH (ref 15–41)
Albumin: 2.1 g/dL — ABNORMAL LOW (ref 3.5–5.0)
Alkaline Phosphatase: 106 U/L (ref 38–126)
Anion gap: 21 — ABNORMAL HIGH (ref 5–15)
BUN: 98 mg/dL — ABNORMAL HIGH (ref 8–23)
CO2: 20 mmol/L — ABNORMAL LOW (ref 22–32)
Calcium: 9.3 mg/dL (ref 8.9–10.3)
Chloride: 95 mmol/L — ABNORMAL LOW (ref 98–111)
Creatinine, Ser: 2.95 mg/dL — ABNORMAL HIGH (ref 0.61–1.24)
GFR, Estimated: 22 mL/min — ABNORMAL LOW (ref >60)
Glucose, Bld: 603 mg/dL (ref 70–99)
Potassium: 6.7 mmol/L (ref 3.5–5.1)
Sodium: 136 mmol/L (ref 135–145)
Total Bilirubin: 1.4 mg/dL — ABNORMAL HIGH (ref 0.0–1.2)
Total Protein: 7.3 g/dL (ref 6.5–8.1)

## 2023-03-26 LAB — BETA-HYDROXYBUTYRIC ACID: Beta-Hydroxybutyric Acid: 4.26 mmol/L — ABNORMAL HIGH (ref 0.05–0.27)

## 2023-03-26 LAB — RESP PANEL BY RT-PCR (RSV, FLU A&B, COVID)  RVPGX2
Influenza A by PCR: NEGATIVE
Influenza B by PCR: NEGATIVE
Resp Syncytial Virus by PCR: NEGATIVE
SARS Coronavirus 2 by RT PCR: NEGATIVE

## 2023-03-26 MED ORDER — INSULIN REGULAR(HUMAN) IN NACL 100-0.9 UT/100ML-% IV SOLN
INTRAVENOUS | Status: DC
  Administered 2023-03-26: 11.5 [IU]/h via INTRAVENOUS
  Administered 2023-03-27: 4.8 [IU]/h via INTRAVENOUS
  Filled 2023-03-26 (×2): qty 100

## 2023-03-26 MED ORDER — CALCIUM GLUCONATE-NACL 1-0.675 GM/50ML-% IV SOLN: 1.0000 g | Freq: Once | INTRAVENOUS | Status: DC

## 2023-03-26 MED ORDER — ACETAMINOPHEN 325 MG PO TABS
650.0000 mg | ORAL_TABLET | Freq: Four times a day (QID) | ORAL | Status: DC | PRN
  Administered 2023-03-27 – 2023-04-02 (×6): 650 mg via ORAL
  Filled 2023-03-26 (×6): qty 2

## 2023-03-26 MED ORDER — GABAPENTIN 100 MG PO CAPS
200.0000 mg | ORAL_CAPSULE | Freq: Every day | ORAL | Status: DC
Start: 2023-03-26 — End: 2023-04-02
  Administered 2023-03-26 – 2023-04-01 (×7): 200 mg via ORAL
  Filled 2023-03-26 (×7): qty 2

## 2023-03-26 MED ORDER — LACTATED RINGERS IV BOLUS: 2000.0000 mL | Freq: Once | INTRAVENOUS | Status: DC

## 2023-03-26 MED ORDER — ONDANSETRON HCL 4 MG/2ML IJ SOLN: 4.0000 mg | Freq: Four times a day (QID) | INTRAMUSCULAR | Status: DC | PRN

## 2023-03-26 MED ORDER — SODIUM CHLORIDE 0.9 % IV BOLUS (SEPSIS)
1000.0000 mL | Freq: Once | INTRAVENOUS | Status: AC
  Administered 2023-03-26: 1000 mL via INTRAVENOUS

## 2023-03-26 MED ORDER — SODIUM CHLORIDE 0.9 % IV BOLUS
1000.0000 mL | Freq: Once | INTRAVENOUS | Status: AC
  Administered 2023-03-26: 1000 mL via INTRAVENOUS

## 2023-03-26 MED ORDER — INSULIN ASPART 100 UNIT/ML IV SOLN: 10.0000 [IU] | Freq: Once | INTRAVENOUS | Status: DC

## 2023-03-26 MED ORDER — SODIUM CHLORIDE 0.9 % IV SOLN: INTRAVENOUS | Status: DC

## 2023-03-26 MED ORDER — SODIUM CHLORIDE 0.9 % IV SOLN: 1000.0000 mL | INTRAVENOUS | Status: DC

## 2023-03-26 MED ORDER — SODIUM ZIRCONIUM CYCLOSILICATE 10 G PO PACK
10.0000 g | PACK | ORAL | Status: AC
  Administered 2023-03-26: 10 g via ORAL
  Filled 2023-03-26: qty 1

## 2023-03-26 MED ORDER — ONDANSETRON HCL 4 MG PO TABS: 4.0000 mg | ORAL_TABLET | Freq: Four times a day (QID) | ORAL | Status: DC | PRN

## 2023-03-26 MED ORDER — ACETAMINOPHEN 650 MG RE SUPP: 650.0000 mg | Freq: Four times a day (QID) | RECTAL | Status: DC | PRN

## 2023-03-26 MED ORDER — ENOXAPARIN SODIUM 30 MG/0.3ML IJ SOSY
30.0000 mg | PREFILLED_SYRINGE | INTRAMUSCULAR | Status: DC
  Administered 2023-03-27 – 2023-03-28 (×2): 30 mg via SUBCUTANEOUS
  Filled 2023-03-26 (×2): qty 0.3

## 2023-03-26 MED ORDER — DEXTROSE 50 % IV SOLN: 0.0000 mL | INTRAVENOUS | Status: DC | PRN

## 2023-03-26 MED ORDER — METOPROLOL SUCCINATE ER 50 MG PO TB24
100.0000 mg | ORAL_TABLET | Freq: Every day | ORAL | Status: DC
  Administered 2023-03-27 – 2023-04-02 (×7): 100 mg via ORAL
  Filled 2023-03-26: qty 2
  Filled 2023-03-26: qty 4
  Filled 2023-03-26 (×5): qty 2

## 2023-03-26 MED ORDER — SODIUM ZIRCONIUM CYCLOSILICATE 10 G PO PACK: 10.0000 g | PACK | ORAL | Status: DC

## 2023-03-26 MED ORDER — FUROSEMIDE 10 MG/ML IJ SOLN
40.0000 mg | Freq: Once | INTRAMUSCULAR | Status: AC
  Administered 2023-03-26: 40 mg via INTRAVENOUS
  Filled 2023-03-26: qty 4

## 2023-03-26 MED ORDER — DEXTROSE IN LACTATED RINGERS 5 % IV SOLN: INTRAVENOUS | Status: AC

## 2023-03-26 MED ORDER — CALCIUM GLUCONATE-NACL 1-0.675 GM/50ML-% IV SOLN
1.0000 g | Freq: Once | INTRAVENOUS | Status: AC
  Administered 2023-03-26: 1000 mg via INTRAVENOUS
  Filled 2023-03-26: qty 50

## 2023-03-26 MED ORDER — SIMVASTATIN 20 MG PO TABS
10.0000 mg | ORAL_TABLET | Freq: Every day | ORAL | Status: DC
  Administered 2023-03-27 – 2023-04-01 (×6): 10 mg via ORAL
  Filled 2023-03-26 (×6): qty 1

## 2023-03-26 MED ORDER — ALBUTEROL SULFATE (2.5 MG/3ML) 0.083% IN NEBU
10.0000 mg | INHALATION_SOLUTION | Freq: Once | RESPIRATORY_TRACT | Status: AC
Start: 2023-03-26 — End: 2023-03-26
  Administered 2023-03-26: 10 mg via RESPIRATORY_TRACT
  Filled 2023-03-26 (×2): qty 12

## 2023-03-26 NOTE — H&P (Signed)
History and Physical    Mark Schieffer Sr. ZOX:[id number] DOB: November 12, 1950 DOA: 03/26/2023  PCP: Shelva Majestic, MD   Chief Complaint: abd pain  HPI: Mark Ferretiz Sr. is a 73 y.o. male with medical history significant of type 2 diabetes, hypertension, hyperlipidemia who presents emergency department with nausea vomiting and diarrhea as well as generalized weakness.  Endorses intermittent abdominal pain has been worsening over the last several days.  Reports likely recent viral illness.  He fell earlier today and his wife found him and called EMS due to him being unable to get up.  He presented to the ER where he was found to be afebrile and hemodynamically stable.  Labs were obtained on presentation which demonstrated glucose 567, respiratory viral panel negative, WBC 15.8, hemoglobin 12.9, potassium 6.7, bicarb 20, glucose 603, creatinine 2.9 baseline around 1.6, AST 57, ALT 39, pH is 7.23, beta hydroxybutyrate 4.26.  Patient was given IV fluids and started on insulin drip and admitted for further management of diabetic ketoacidosis.  On evaluation he was endorsing persistent abdominal pain as well as nausea.  Symptoms have responded to therapy.  He denies fever or chills.   Review of Systems: Review of Systems  Constitutional:  Positive for malaise/fatigue. Negative for chills and fever.  HENT: Negative.    Eyes: Negative.   Respiratory: Negative.    Cardiovascular: Negative.   Gastrointestinal: Negative.   Genitourinary: Negative.   Musculoskeletal: Negative.   Skin: Negative.   Neurological: Negative.   Endo/Heme/Allergies: Negative.   Psychiatric/Behavioral: Negative.    All other systems reviewed and are negative.    As per HPI otherwise 10 point review of systems negative.   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 He has never used smokeless tobacco. He reports that he does not drink alcohol and does not use drugs.  Family History  Problem Relation Age of Onset   Diabetes Other    Kidney disease Other    Other Mother        respiratory failure died in 16s   Alcohol abuse Father    Lung disease Father        smoking   Heart disease Sister        heart attack 68   Alcohol abuse Brother    Cirrhosis Brother    Kidney disease Sister    COPD Sister        led to death. procedural complication   Diabetes Brother     Prior to Admission medications   Medication Sig Start Date End Date Taking? Authorizing Provider  acetaminophen (TYLENOL) 500 MG tablet Take 1,000 mg by mouth every 6 (six) hours as needed for moderate pain.   Yes [provider]  Ascorbic Acid (VITAMIN C PO) Take 1 tablet by mouth daily.  Yes [provider]  b complex vitamins capsule Take 1 capsule by mouth daily.   Yes [provider]  CRANBERRY PO Take 1 capsule by mouth daily.   Yes [provider]  Ferrous Sulfate (IRON PO) Take 1 tablet by mouth daily.   Yes [provider]  gabapentin (NEURONTIN) 100 MG capsule TAKE 2 CAPSULES BY MOUTH AT BEDTIME. 03/11/23  Yes Antoine Primas M, DO  GARLIC PO Take 1 capsule by mouth daily.   Yes [provider]  gentamicin cream (GARAMYCIN) 0.1 % Apply 1 Application topically 2 (two) times daily. Patient taking differently: Apply 1 Application topically 2  (two) times daily as needed (rash). 06/25/22  Yes Felecia Shelling, DPM  hydrochlorothiazide (HYDRODIURIL) 25 MG tablet TAKE 1 TABLET BY MOUTH EVERY DAY 03/14/23  Yes Shelva Majestic, MD  hydrOXYzine (ATARAX) 10 MG tablet Take 1-2 tablets (10-20 mg total) by mouth 3 (three) times daily as needed for itching. 10/01/22  Yes Hudnell, Judeth Cornfield, NP  JARDIANCE 10 MG TABS tablet TAKE 1 TABLET BY MOUTH EVERY DAY BEFORE BREAKFAST 01/22/23  Yes Shelva Majestic, MD  ketoconazole (NIZORAL) 2 % cream Apply 1 Application topically daily. For 2 weeks- if not improved- can stop and let me know Patient taking differently: Apply 1 Application topically daily as needed for irritation. For 2 weeks- if not improved- can stop and let me know 02/13/22  Yes Shelva Majestic, MD  meloxicam (MOBIC) 7.5 MG tablet Take 1 tablet (7.5 mg total) by mouth daily. Patient taking differently: Take 7.5 mg by mouth daily as needed for pain. 02/05/23  Yes Antoine Primas M, DO  metFORMIN (GLUCOPHAGE) 1000 MG tablet TAKE 1 TABLET BY MOUTH TWICE A DAY WITH FOOD 12/12/22  Yes Shelva Majestic, MD  metoprolol succinate (TOPROL-XL) 100 MG 24 hr tablet TAKE 1 TABLET BY MOUTH WITH OR IMMEDIATELY FOLLOWING A MEAL. 03/14/23  Yes Shelva Majestic, MD  Naphazoline-Pheniramine (VISINE-A OP) Place 1 drop into both eyes daily as needed (dry eyes).   Yes [provider]  sildenafil (REVATIO) 20 MG tablet Take 1-5 tablets as needed. 04/24/21  Yes Shelva Majestic, MD  sildenafil (VIAGRA) 100 MG tablet Take 1/2 to 1 tablet as needed for erectile dysfunction up to every 48 hours 04/24/21  Yes Shelva Majestic, MD  simvastatin (ZOCOR) 10 MG tablet TAKE 1 TABLET BY MOUTH EVERY DAY 11/14/22  Yes Shelva Majestic, MD  triamcinolone cream (KENALOG) 0.1 % Apply 1 Application topically 2 (two) times daily. Mix small amount equally with hand or body lotion/cream for itching. Patient taking differently: Apply 1 Application topically 2 (two) times daily as  needed (itching). Mix small amount equally with hand or body lotion/cream for itching. 10/01/22  Yes Hudnell, Judeth Cornfield, NP  TURMERIC PO Take 1 capsule by mouth daily.   Yes [provider]  VITAMIN D PO Take 1 capsule by mouth daily.   Yes [provider]  ACCU-CHEK GUIDE test strip USE AS INSTRUCTED 09/11/22   Shelva Majestic, MD  Accu-Chek Softclix Lancets lancets USE AS DIRECTED 02/23/22   Shelva Majestic, MD  Blood Glucose Monitoring Suppl (ACCU-CHEK GUIDE ME) w/Device KIT Use as directed 02/21/21   Shelva Majestic, MD    Physical Exam: Vitals:   03/26/23 1651 03/26/23 1858 03/26/23 1900 03/26/23 1938  BP: 138/67  138/69   Pulse: 88  93   Resp: 18  (!) 29   Temp: 98.1 F (36.7 C)  SpO2: 95% 93% 98%   Weight:    95.5 kg  Height:    6' 5.5" (1.969 m)   Physical Exam Vitals reviewed.  Constitutional:      Appearance: He is normal weight.  HENT:     Head: Normocephalic.     Nose: Nose normal.     Mouth/Throat:     Mouth: Mucous membranes are moist.     Pharynx: Oropharynx is clear.  Eyes:     Conjunctiva/sclera: Conjunctivae normal.     Pupils: Pupils are equal, round, and reactive to light.  Cardiovascular:     Rate and Rhythm: Normal rate and regular rhythm.     Pulses: Normal pulses.     Heart sounds: Normal heart sounds.  Pulmonary:     Effort: Pulmonary effort is normal.     Breath sounds: Normal breath sounds.  Abdominal:     General: Abdomen is flat. Bowel sounds are normal.  Musculoskeletal:        General: Normal range of motion.     Cervical back: Normal range of motion.  Skin:    General: Skin is warm.     Capillary Refill: Capillary refill takes less than 2 seconds.  Neurological:     General: No focal deficit present.     Mental Status: He is alert. Mental status is at baseline.  Psychiatric:        Mood and Affect: Mood normal.       Labs on Admission: I have personally reviewed the patients's labs and imaging  studies.  Assessment/Plan Principal Problem:   DKA (diabetic ketoacidosis) (HCC)   # Diabetic ketoacidosis - pH 7.23, anion gap 21, beta hydroxybutyrate greater than 4 - Patient presented with nausea vomiting abdominal pain - Blood sugars greater than 600  Plan: Continue IV fluids Continue insulin drip Continue lab checks and transition to subcutaneous once gap has been closed for 2 BMPs  # AKI on CKD stage IIIb-trend creatinine.  Likely related to dehydration in setting of DKA.  Should respond to IV fluid.  # Hyperkalemia-patient's potassium greater than 6.  No changes on EKG.  Patient is currently on insulin drip and received calcium gluconate and Lasix.  Will continue IV fluids and trend potassium.  Patient received 1 dose of Lokelma.  Will continue.  # Hypertension-hold chlorothiazide, losartan.  Continue metoprolol    Admission status: Inpatient Progressive  Certification: The appropriate patient status for this patient is INPATIENT. Inpatient status is judged to be reasonable and necessary in order to provide the required intensity of service to ensure the patient's safety. The patient's presenting symptoms, physical exam findings, and initial radiographic and laboratory data in the context of their chronic comorbidities is felt to place them at high risk for further clinical deterioration. Furthermore, it is not anticipated that the patient will be medically stable for discharge from the hospital within 2 midnights of admission.   * I certify that at the point of admission it is my clinical judgment that the patient will require inpatient hospital care spanning beyond 2 midnights from the point of admission due to high intensity of service, high risk for further deterioration and high frequency of surveillance required.Alan Mulder MD [facility] If 7PM-7AM, please contact night-coverage [email or web address]  03/26/2023, 8:25 PM

## 2023-03-26 NOTE — ED Provider Notes (Signed)
[place] EMERGENCY DEPARTMENT AT [facility] Provider Note   CSN: [id number] Arrival date & time: 03/26/23  1642     History {Add pertinent medical, surgical, social history, OB history to HPI:1} Chief Complaint  Patient presents with   Weakness   Vomiting   Hyperglycemia    Mark Rametta Sr. is a 73 y.o. male.  73 year old male with history of diabetes, hypertension, and hyperlipidemia who presents to the emergency department with nausea, vomiting, and diarrhea.  Also has had generalized weakness.  Symptoms started approximately 6 days ago.  Has been having approximately 2 loose stools per day and several episodes of nonbloody nonbilious emesis.  Occasionally get abdominal tightness but none at this point in time.  Has been feeling generally weak.  Went to the restroom today and slipped on the urine and fell on his right shoulder.  No head strike or LOC.  Wife called 911 to assist him getting up.  Has been compliant with his metformin recently and says his blood sugars have been in the 180s recently as well.       Home Medications Prior to Admission medications   Medication Sig Start Date End Date Taking? Authorizing Provider  ACCU-CHEK GUIDE test strip USE AS INSTRUCTED 09/11/22   Shelva Majestic, MD  Accu-Chek Softclix Lancets lancets USE AS DIRECTED 02/23/22   Shelva Majestic, MD  acetaminophen (TYLENOL) 500 MG tablet Take 1,000 mg by mouth every 6 (six) hours as needed for moderate pain.    [provider]  Ascorbic Acid (VITAMIN C PO) Take 1 tablet by mouth daily.    [provider]  b complex vitamins capsule Take 1 capsule by mouth daily.    [provider]  Blood Glucose Monitoring Suppl (ACCU-CHEK GUIDE ME) w/Device KIT Use as directed 02/21/21   Shelva Majestic, MD  CRANBERRY PO Take 1 capsule by mouth daily.    [provider]  docusate sodium (COLACE) 100 MG capsule Take 1 capsule (100 mg total) by mouth daily  as needed for up to 30 doses. 03/25/20   Jannifer Hick, MD  doxycycline (VIBRA-TABS) 100 MG tablet Take 1 tablet (100 mg total) by mouth 2 (two) times daily. 06/25/22   Felecia Shelling, DPM  Ferrous Sulfate (IRON PO) Take 1 tablet by mouth daily.    [provider]  gabapentin (NEURONTIN) 100 MG capsule TAKE 2 CAPSULES BY MOUTH AT BEDTIME. 03/11/23   Judi Saa, DO  GARLIC PO Take 1 capsule by mouth daily.    [provider]  gentamicin cream (GARAMYCIN) 0.1 % Apply 1 Application topically 2 (two) times daily. 06/25/22   Felecia Shelling, DPM  hydrochlorothiazide (HYDRODIURIL) 25 MG tablet TAKE 1 TABLET BY MOUTH EVERY DAY 03/14/23   Shelva Majestic, MD  hydrOXYzine (ATARAX) 10 MG tablet Take 1-2 tablets (10-20 mg total) by mouth 3 (three) times daily as needed for itching. 10/01/22   Dulce Sellar, NP  JARDIANCE 10 MG TABS tablet TAKE 1 TABLET BY MOUTH EVERY DAY BEFORE BREAKFAST 01/22/23   Shelva Majestic, MD  ketoconazole (NIZORAL) 2 % cream Apply 1 Application topically daily. For 2 weeks- if not improved- can stop and let me know 02/13/22   Shelva Majestic, MD  meloxicam (MOBIC) 7.5 MG tablet Take 1 tablet (7.5 mg total) by mouth daily. 02/05/23   Judi Saa, DO  metFORMIN (GLUCOPHAGE) 1000 MG tablet TAKE 1 TABLET BY MOUTH TWICE A DAY WITH  FOOD 12/12/22   Shelva Majestic, MD  metoprolol succinate (TOPROL-XL) 100 MG 24 hr tablet TAKE 1 TABLET BY MOUTH WITH OR IMMEDIATELY FOLLOWING A MEAL. 03/14/23   Shelva Majestic, MD  Naphazoline-Pheniramine (VISINE-A OP) Place 1 drop into both eyes daily as needed (dry eyes).    [provider]  sildenafil (REVATIO) 20 MG tablet Take 1-5 tablets as needed. 04/24/21   Shelva Majestic, MD  sildenafil (VIAGRA) 100 MG tablet Take 1/2 to 1 tablet as needed for erectile dysfunction up to every 48 hours 04/24/21   Shelva Majestic, MD  simvastatin (ZOCOR) 10 MG tablet TAKE 1 TABLET BY MOUTH EVERY DAY 11/14/22   Shelva Majestic,  MD  triamcinolone cream (KENALOG) 0.1 % Apply 1 Application topically 2 (two) times daily. Mix small amount equally with hand or body lotion/cream for itching. 10/01/22   Dulce Sellar, NP  TURMERIC PO Take 1 capsule by mouth daily.    [provider]  VITAMIN D PO Take 1 capsule by mouth daily.    [provider]      Allergies    Patient has no known allergies.    Review of Systems   Review of Systems  Physical Exam Updated Vital Signs BP 138/67   Pulse 88   Temp 98.1 F (36.7 C)   Resp 18   SpO2 95%  Physical Exam Vitals and nursing note reviewed.  Constitutional:      General: He is not in acute distress.    Appearance: He is well-developed.  HENT:     Head: Normocephalic and atraumatic.     Right Ear: External ear normal.     Left Ear: External ear normal.     Nose: Nose normal.     Mouth/Throat:     Mouth: Mucous membranes are dry.     Pharynx: Oropharynx is clear.  Eyes:     Extraocular Movements: Extraocular movements intact.     Conjunctiva/sclera: Conjunctivae normal.     Pupils: Pupils are equal, round, and reactive to light.  Cardiovascular:     Rate and Rhythm: Normal rate and regular rhythm.     Heart sounds: Normal heart sounds.  Pulmonary:     Effort: Pulmonary effort is normal. No respiratory distress.     Breath sounds: Normal breath sounds.  Abdominal:     General: There is no distension.     Palpations: Abdomen is soft. There is no mass.     Tenderness: There is no abdominal tenderness. There is no guarding.  Musculoskeletal:     Cervical back: Normal range of motion and neck supple.     Right lower leg: No edema.     Left lower leg: No edema.  Skin:    General: Skin is warm and dry.  Neurological:     Mental Status: He is alert. Mental status is at baseline.  Psychiatric:        Mood and Affect: Mood normal.        Behavior: Behavior normal.     ED Results / Procedures / Treatments   Labs (all labs ordered are  listed, but only abnormal results are displayed) Labs Reviewed  CBC - Abnormal; Notable for the following components:      Result Value   WBC 15.8 (*)    RBC 4.21 (*)    Hemoglobin 12.9 (*)    All other components within normal limits  CBG MONITORING, ED - Abnormal; Notable for the following  components:   Glucose-Capillary 567 (*)    All other components within normal limits  I-STAT VENOUS BLOOD GAS, ED - Abnormal; Notable for the following components:   pH, Ven 7.238 (*)    Acid-base deficit 5.0 (*)    Sodium 134 (*)    Potassium 6.1 (*)    Calcium, Ion 1.10 (*)    All other components within normal limits  RESP PANEL BY RT-PCR (RSV, FLU A&B, COVID)  RVPGX2  URINALYSIS, ROUTINE W REFLEX MICROSCOPIC  COMPREHENSIVE METABOLIC PANEL    EKG None  Radiology No results found.  Procedures Procedures  {Document cardiac monitor, telemetry assessment procedure when appropriate:1}  Medications Ordered in ED Medications  lactated ringers bolus 2,000 mL (has no administration in time range)    ED Course/ Medical Decision Making/ A&P   {   Click here for ABCD2, HEART and other calculatorsREFRESH Note before signing :1}                              Medical Decision Making Amount and/or Complexity of Data Reviewed Labs: ordered.   ***  {Document critical care time when appropriate:1} {Document review of labs and clinical decision tools ie heart score, Chads2Vasc2 etc:1}  {Document your independent review of radiology images, and any outside records:1} {Document your discussion with family members, caretakers, and with consultants:1} {Document social determinants of health affecting pt's care:1} {Document your decision making why or why not admission, treatments were needed:1} Final Clinical Impression(s) / ED Diagnoses Final diagnoses:  None    Rx / DC Orders ED Discharge Orders     None

## 2023-03-26 NOTE — ED Triage Notes (Addendum)
Pt arrives via EMS from home. Pt reports for the past 6 days, he has been having nausea, vomiting, coughing, and generalized weakness. Pt arrives AxOx4. He is lethargic during triage. Pt did have a fall earlier today while trying to get to the restroom. Pt denies any injury from the fall.  EMS states BG read High for them.

## 2023-03-27 ENCOUNTER — Telehealth (HOSPITAL_COMMUNITY): Payer: Self-pay | Admitting: Pharmacy Technician

## 2023-03-27 ENCOUNTER — Other Ambulatory Visit (HOSPITAL_COMMUNITY): Payer: Self-pay

## 2023-03-27 ENCOUNTER — Ambulatory Visit: Payer: Medicare PPO | Admitting: Family

## 2023-03-27 DIAGNOSIS — E1110 Type 2 diabetes mellitus with ketoacidosis without coma: Secondary | ICD-10-CM | POA: Diagnosis not present

## 2023-03-27 DIAGNOSIS — Z8639 Personal history of other endocrine, nutritional and metabolic disease: Secondary | ICD-10-CM | POA: Diagnosis present

## 2023-03-27 LAB — GLUCOSE, CAPILLARY
Glucose-Capillary: 201 mg/dL — ABNORMAL HIGH (ref 70–99)
Glucose-Capillary: 251 mg/dL — ABNORMAL HIGH (ref 70–99)
Glucose-Capillary: 191 mg/dL — ABNORMAL HIGH (ref 70–99)

## 2023-03-27 LAB — CBG MONITORING, ED
Glucose-Capillary: 161 mg/dL — ABNORMAL HIGH (ref 70–99)
Glucose-Capillary: 164 mg/dL — ABNORMAL HIGH (ref 70–99)
Glucose-Capillary: 179 mg/dL — ABNORMAL HIGH (ref 70–99)
Glucose-Capillary: 181 mg/dL — ABNORMAL HIGH (ref 70–99)
Glucose-Capillary: 209 mg/dL — ABNORMAL HIGH (ref 70–99)
Glucose-Capillary: 221 mg/dL — ABNORMAL HIGH (ref 70–99)
Glucose-Capillary: 230 mg/dL — ABNORMAL HIGH (ref 70–99)
Glucose-Capillary: 314 mg/dL — ABNORMAL HIGH (ref 70–99)
Glucose-Capillary: 320 mg/dL — ABNORMAL HIGH (ref 70–99)

## 2023-03-27 LAB — BASIC METABOLIC PANEL WITH GFR
Anion gap: 15 (ref 5–15)
Anion gap: 12 (ref 5–15)
Anion gap: 13 (ref 5–15)
BUN: 106 mg/dL — ABNORMAL HIGH (ref 8–23)
BUN: 109 mg/dL — ABNORMAL HIGH (ref 8–23)
BUN: 110 mg/dL — ABNORMAL HIGH (ref 8–23)
CO2: 23 mmol/L (ref 22–32)
CO2: 26 mmol/L (ref 22–32)
CO2: 26 mmol/L (ref 22–32)
Calcium: 8.8 mg/dL — ABNORMAL LOW (ref 8.9–10.3)
Calcium: 8.8 mg/dL — ABNORMAL LOW (ref 8.9–10.3)
Calcium: 8.8 mg/dL — ABNORMAL LOW (ref 8.9–10.3)
Chloride: 104 mmol/L (ref 98–111)
Chloride: 106 mmol/L (ref 98–111)
Chloride: 107 mmol/L (ref 98–111)
Creatinine, Ser: 2.52 mg/dL — ABNORMAL HIGH (ref 0.61–1.24)
Creatinine, Ser: 2.34 mg/dL — ABNORMAL HIGH (ref 0.61–1.24)
Creatinine, Ser: 2.38 mg/dL — ABNORMAL HIGH (ref 0.61–1.24)
GFR, Estimated: 26 mL/min — ABNORMAL LOW
GFR, Estimated: 28 mL/min — ABNORMAL LOW
GFR, Estimated: 28 mL/min — ABNORMAL LOW
Glucose, Bld: 279 mg/dL — ABNORMAL HIGH (ref 70–99)
Glucose, Bld: 237 mg/dL — ABNORMAL HIGH (ref 70–99)
Glucose, Bld: 247 mg/dL — ABNORMAL HIGH (ref 70–99)
Potassium: 4.3 mmol/L (ref 3.5–5.1)
Potassium: 4.1 mmol/L (ref 3.5–5.1)
Potassium: 4.1 mmol/L (ref 3.5–5.1)
Sodium: 142 mmol/L (ref 135–145)
Sodium: 144 mmol/L (ref 135–145)
Sodium: 146 mmol/L — ABNORMAL HIGH (ref 135–145)

## 2023-03-27 LAB — HEMOGLOBIN A1C
Hgb A1c MFr Bld: 10.3 % — ABNORMAL HIGH (ref 4.8–5.6)
Mean Plasma Glucose: 248.91 mg/dL

## 2023-03-27 LAB — BETA-HYDROXYBUTYRIC ACID: Beta-Hydroxybutyric Acid: 0.11 mmol/L (ref 0.05–0.27)

## 2023-03-27 MED ORDER — INSULIN ASPART 100 UNIT/ML IJ SOLN
0.0000 [IU] | Freq: Three times a day (TID) | INTRAMUSCULAR | Status: DC
  Administered 2023-03-27 – 2023-03-28 (×2): 8 [IU] via SUBCUTANEOUS
  Administered 2023-03-28: 11 [IU] via SUBCUTANEOUS
  Administered 2023-03-28: 3 [IU] via SUBCUTANEOUS
  Administered 2023-03-29: 8 [IU] via SUBCUTANEOUS
  Administered 2023-03-29 (×2): 3 [IU] via SUBCUTANEOUS
  Administered 2023-03-30: 11 [IU] via SUBCUTANEOUS
  Administered 2023-03-30: 2 [IU] via SUBCUTANEOUS
  Administered 2023-03-31: 11 [IU] via SUBCUTANEOUS
  Administered 2023-03-31: 2 [IU] via SUBCUTANEOUS
  Administered 2023-03-31: 5 [IU] via SUBCUTANEOUS
  Administered 2023-04-01: 8 [IU] via SUBCUTANEOUS
  Administered 2023-04-01: 2 [IU] via SUBCUTANEOUS
  Administered 2023-04-01: 5 [IU] via SUBCUTANEOUS
  Administered 2023-04-02: 2 [IU] via SUBCUTANEOUS

## 2023-03-27 MED ORDER — SODIUM CHLORIDE 0.9 % IV BOLUS
1000.0000 mL | Freq: Once | INTRAVENOUS | Status: AC
  Administered 2023-03-27: 1000 mL via INTRAVENOUS

## 2023-03-27 MED ORDER — GUAIFENESIN-DM 100-10 MG/5ML PO SYRP
5.0000 mL | ORAL_SOLUTION | ORAL | Status: DC | PRN
  Administered 2023-03-28 (×2): 5 mL via ORAL
  Filled 2023-03-27 (×3): qty 5

## 2023-03-27 MED ORDER — INSULIN GLARGINE-YFGN 100 UNIT/ML ~~LOC~~ SOLN
30.0000 [IU] | Freq: Every day | SUBCUTANEOUS | Status: DC
  Administered 2023-03-27 – 2023-03-28 (×2): 30 [IU] via SUBCUTANEOUS
  Filled 2023-03-27 (×2): qty 0.3

## 2023-03-27 MED ORDER — LACTATED RINGERS IV SOLN: INTRAVENOUS | Status: AC

## 2023-03-27 MED ORDER — INSULIN ASPART 100 UNIT/ML IJ SOLN
0.0000 [IU] | Freq: Every day | INTRAMUSCULAR | Status: DC
  Administered 2023-03-28: 2 [IU] via SUBCUTANEOUS

## 2023-03-27 NOTE — Telephone Encounter (Signed)
Pharmacy Patient Advocate Encounter   Received notification that prior authorization for FreeStyle Libre 3 Sensoris required/requested.   Insurance verification completed.   The patient is insured through [employer] .   Per test claim: PA required; PA submitted to above mentioned insurance via CoverMyMeds Key/confirmation #/EOC [id number] Status is pending

## 2023-03-27 NOTE — Telephone Encounter (Signed)
Pharmacy Patient Advocate Encounter  Received notification from [facility] that Prior Authorization for FreeStyle Libre 3 Sensor  has been APPROVED from 03/27/2023 to 03/03/2024. Ran test claim, Copay is $0.00. This test claim was processed through [facility]- copay amounts may vary at other pharmacies due to pharmacy/plan contracts, or as the patient moves through the different stages of their insurance plan.   PA #/Case ID/Reference #: [id number]

## 2023-03-27 NOTE — Progress Notes (Signed)
PROGRESS NOTE    Mark Orloff Sr.  ZOX:[id number] DOB: [birth year] DOA: 03/26/2023 PCP: Shelva Majestic, MD    Brief Narrative:  73 year old gentleman with history of type 2 diabetes on metformin and Jardiance, hypertension and hyperlipidemia presented to the emergency department with nausea vomiting and diarrhea, extreme generalized weakness and fall at home.  He has been having intermittent abdominal pain for the last several days.  He also suffered recent viral-like illnesses.  He fell in the bathroom yesterday morning.  EMS was called.  Patient was brought to the ER.  He was found with blood glucose of 567, respiratory virus panel negative.  Potassium 6.7, bicarb 20, creatinine 2.9 with a baseline creatinine about 1.6.  Beta-hydroxybutyrate 4.26.  Given IV fluids, started on insulin infusion and admitted to the hospital.  Subjective: Patient seen and examined.  Wife at the bedside.  Her daughter was on the phone.  Patient had uneventful night.  Denies any nausea vomiting.  He is hungry and thirsty.  He was given diabetic diet and tolerated without difficulties.  Feels weak.  Anion gap has closed. Will convert patient to subcu insulin and admitted to MedSurg bed.   Assessment & Plan:   Diabetic ketoacidosis, presented with pH 7.23, anion gap 21, beta hydroxybutyrate more than 4. Type 2 diabetes, uncontrolled with hyperglycemia and ketoacidosis.  Clinically improved.  Received insulin infusion.  Now on 12 units/h.  Anion gap is closed.  Able to eat regular diet. Start on long-acting insulin 30 units, high-dose sliding scale insulin.  Hemoglobin A1c is 10.  Patient on metformin and Jardiance at home.  With progressive kidney disease, it will be challenging to maintain him on these medications.  Discussed with patient and family about going home on insulin regimen and they agree. Starting on insulin, will uptitrate gradually.  Insulin administration education and monitoring at home.   Appreciate diabetic coordinator seeing patient.  Will monitor over next 24 hours and anticipate home insulin treatment.  Acute kidney injury on CKD stage IIIb: Recent known baseline creatinine of 1.6.  Probably prerenal.  Treated with fluids and insulin.  Continue maintenance IV fluids for next 24 hours.  Monitor levels.  Hyperkalemia,: As above.  Due to acidosis.  Normalized.  Hypertension: On losartan and hydrochlorothiazide at home.  Holding.  Continue metoprolol.  Since patient is taken off insulin infusion, he can be transferred to MedSurg bed.     DVT prophylaxis: enoxaparin (LOVENOX) injection 30 mg Start: 03/27/23 1000 SCDs Start: 03/26/23 2022   Code Status: Full code Family Communication: Wife at the bedside.  Daughter on the phone. Disposition Plan: Status is: Inpatient Remains inpatient appropriate because: Severely elevated blood sugars     Consultants:  None  Procedures:  None  Antimicrobials:  None     Objective: Vitals:   03/27/23 1145 03/27/23 1152 03/27/23 1200 03/27/23 1230  BP: (!) 147/84 (!) 147/84 (!) 145/78 (!) 151/76  Pulse: (!) 119 (!) 118 (!) 117 (!) 117  Resp: (!) 26 (!) 29 (!) 24 (!) 26  Temp:  98.7 F (37.1 C)    TempSrc:  Oral    SpO2: 94% 94% 95% 94%  Weight:      Height:        Intake/Output Summary (Last 24 hours) at 03/27/2023 1314 Last data filed at 03/27/2023 1205 Gross per 24 hour  Intake 1052.01 ml  Output 560 ml  Net 492.01 ml   Filed Weights   03/26/23 1938  Weight: 95.5 kg  Examination:  General exam: Appears calm and comfortable  Respiratory system: Clear to auscultation. Respiratory effort normal. Cardiovascular system: S1 & S2 heard, RRR.  Gastrointestinal system: Soft.  Nontender.  Bowel sound present. Central nervous system: Alert and oriented. No focal neurological deficits. Extremities: Symmetric 5 x 5 power. Skin: No rashes, lesions or ulcers Psychiatry: Judgement and insight appear normal. Mood &  affect appropriate.     Data Reviewed: I have personally reviewed following labs and imaging studies  CBC: Recent Labs  Lab 03/26/23 1749 03/26/23 1809  WBC 15.8*  --   HGB 12.9* 14.3  HCT 40.5 42.0  MCV 96.2  --   PLT 350  --    Basic Metabolic Panel: Recent Labs  Lab 03/26/23 1749 03/26/23 1809 03/26/23 1954 03/27/23 0204 03/27/23 0541  NA 136 134* 137 142 144  146*  K 6.7* 6.1* 5.1 4.3 4.1  4.1  CL 95*  --  97* 104 106  107  CO2 20*  --  20* 23 26  26   GLUCOSE 603*  --  580* 279* 247*  237*  BUN 98*  --  104* 106* 110*  109*  CREATININE 2.95*  --  2.83* 2.52* 2.38*  2.34*  CALCIUM 9.3  --  8.8* 8.8* 8.8*  8.8*   GFR: Estimated Creatinine Clearance: 34.8 mL/min (A) (by C-G formula based on SCr of 2.38 mg/dL (H)). Liver Function Tests: Recent Labs  Lab 03/26/23 1749  AST 57*  ALT 39  ALKPHOS 106  BILITOT 1.4*  PROT 7.3  ALBUMIN 2.1*   No results for input(s): "LIPASE", "AMYLASE" in the last 168 hours. No results for input(s): "AMMONIA" in the last 168 hours. Coagulation Profile: No results for input(s): "INR", "PROTIME" in the last 168 hours. Cardiac Enzymes: No results for input(s): "CKTOTAL", "CKMB", "CKMBINDEX", "TROPONINI" in the last 168 hours. BNP (last 3 results) No results for input(s): "PROBNP" in the last 8760 hours. HbA1C: Recent Labs    03/27/23 0204  HGBA1C 10.3*   CBG: Recent Labs  Lab 03/27/23 0652 03/27/23 0754 03/27/23 0855 03/27/23 1007 03/27/23 1159  GLUCAP 179* 181* 164* 314* 161*   Lipid Profile: No results for input(s): "CHOL", "HDL", "LDLCALC", "TRIG", "CHOLHDL", "LDLDIRECT" in the last 72 hours. Thyroid Function Tests: No results for input(s): "TSH", "T4TOTAL", "FREET4", "T3FREE", "THYROIDAB" in the last 72 hours. Anemia Panel: No results for input(s): "VITAMINB12", "FOLATE", "FERRITIN", "TIBC", "IRON", "RETICCTPCT" in the last 72 hours. Sepsis Labs: No results for input(s): "PROCALCITON", "LATICACIDVEN" in  the last 168 hours.  Recent Results (from the past 240 hours)  Resp panel by RT-PCR (RSV, Flu A&B, Covid) Anterior Nasal Swab     Status: None   Collection Time: 03/26/23  5:00 PM   Specimen: Anterior Nasal Swab  Result Value Ref Range Status   SARS Coronavirus 2 by RT PCR NEGATIVE NEGATIVE Final   Influenza A by PCR NEGATIVE NEGATIVE Final   Influenza B by PCR NEGATIVE NEGATIVE Final    Comment: (NOTE) The Xpert Xpress SARS-CoV-2/FLU/RSV plus assay is intended as an aid in the diagnosis of influenza from Nasopharyngeal swab specimens and should not be used as a sole basis for treatment. Nasal washings and aspirates are unacceptable for Xpert Xpress SARS-CoV-2/FLU/RSV testing.  Fact Sheet for Patients: BloggerCourse.com  Fact Sheet for Healthcare Providers: SeriousBroker.it  This test is not yet approved or cleared by the [place] FDA and has been authorized for detection and/or diagnosis of SARS-CoV-2 by FDA under an Emergency Use Authorization (  EUA). This EUA will remain in effect (meaning this test can be used) for the duration of the COVID-19 declaration under Section 564(b)(1) of the Act, 21 U.S.C. section 360bbb-3(b)(1), unless the authorization is terminated or revoked.     Resp Syncytial Virus by PCR NEGATIVE NEGATIVE Final    Comment: (NOTE) Fact Sheet for Patients: BloggerCourse.com  Fact Sheet for Healthcare Providers: SeriousBroker.it  This test is not yet approved or cleared by the [place] FDA and has been authorized for detection and/or diagnosis of SARS-CoV-2 by FDA under an Emergency Use Authorization (EUA). This EUA will remain in effect (meaning this test can be used) for the duration of the COVID-19 declaration under Section 564(b)(1) of the Act, 21 U.S.C. section 360bbb-3(b)(1), unless the authorization is terminated or revoked.  Performed at  [facility], [address] [place], [place] [address]          Radiology Studies: No results found.      Scheduled Meds:  enoxaparin (LOVENOX) injection  30 mg Subcutaneous Q24H   gabapentin  200 mg Oral QHS   insulin aspart  0-15 Units Subcutaneous TID WC   insulin aspart  0-5 Units Subcutaneous QHS   insulin glargine-yfgn  30 Units Subcutaneous Daily   metoprolol succinate  100 mg Oral Daily   simvastatin  10 mg Oral q1800   Continuous Infusions:  dextrose 5% lactated ringers Stopped (03/27/23 1137)   lactated ringers       LOS: 1 day    Time spent: 52 minutes    Dorcas Carrow, MD [facility]

## 2023-03-27 NOTE — Inpatient Diabetes Management (Addendum)
Inpatient Diabetes Program Recommendations  AACE/ADA: New Consensus Statement on Inpatient Glycemic Control (2015)  Target Ranges:  Prepandial:   less than 140 mg/dL      Peak postprandial:   less than 180 mg/dL (1-2 hours)      Critically ill patients:  140 - 180 mg/dL   Lab Results  Component Value Date   GLUCAP 314 (H) 03/27/2023   HGBA1C 10.3 (H) 03/27/2023    Review of Glycemic Control  Diabetes history: DM 2 Outpatient Diabetes medications: Jardiance 10 mg Daily, Metformin 1000 mg bid Current orders for Inpatient glycemic control:  Semglee 30 units Novolog 0-15 units tid + hs  Elevated renal function A1c 10.3% on 1/22 Note: pt on SGLT-2 which can precipitate DKA, consider d/cing it at discharge and have pt follow up with prescriber. Pt to be new to insulin?  Running benefit check on CGM and basal insulin for d/c preparation.  Spoke with pt, wife, and son at bedside. Discussing Jardiance medication/DKA, high A1c level, need for insulin at time of d/c, glucose/A1c goals, CGM introduction and app download, operation of insulin pen.  CGM no copay Insulin $35 copay Encouraged PCP follow up. Will review with pt/wife again tomorrow 1/23   Thanks,  Christena Deem RN, MSN, BC-ADM Inpatient Diabetes Coordinator Team Pager [phone] (8a-5p)

## 2023-03-27 NOTE — Progress Notes (Deleted)
Patient ID: Mark Cunas Sr., male    DOB: 06-Jan-1951, 73 y.o.   MRN: [id number]  No chief complaint on file.           Assessment & Plan:   Subjective:    Facility-Administered Medications Prior to Visit  Medication Dose Route Frequency Provider Last Rate Last Admin   acetaminophen (TYLENOL) tablet 650 mg  650 mg Oral Q6H PRN Alan Mulder, MD   650 mg at 03/27/23 0102   Or   acetaminophen (TYLENOL) suppository 650 mg  650 mg Rectal Q6H PRN Alan Mulder, MD       dextrose 50 % solution 0-50 mL  0-50 mL Intravenous PRN Alan Mulder, MD       enoxaparin (LOVENOX) injection 30 mg  30 mg Subcutaneous Q24H Dorrell, Robert, MD       gabapentin (NEURONTIN) capsule 200 mg  200 mg Oral QHS Alan Mulder, MD   200 mg at 03/26/23 2156   insulin regular, human (MYXREDLIN) 100 units/ 100 mL infusion   Intravenous Continuous Alan Mulder, MD 4 mL/hr at 03/27/23 7253 4 Units/hr at 03/27/23 6644   metoprolol succinate (TOPROL-XL) 24 hr tablet 100 mg  100 mg Oral Daily Dorrell, Molly Maduro, MD       ondansetron [facility]) tablet 4 mg  4 mg Oral Q6H PRN Alan Mulder, MD       Or   ondansetron (ZOFRAN) injection 4 mg  4 mg Intravenous Q6H PRN Alan Mulder, MD       simvastatin (ZOCOR) tablet 10 mg  10 mg Oral q1800 Alan Mulder, MD       Outpatient Medications Prior to Visit  Medication Sig Dispense Refill   ACCU-CHEK GUIDE test strip USE AS INSTRUCTED 100 strip 5   Accu-Chek Softclix Lancets lancets USE AS DIRECTED 100 each 5   acetaminophen (TYLENOL) 500 MG tablet Take 1,000 mg by mouth every 6 (six) hours as needed for moderate pain.     Ascorbic Acid (VITAMIN C PO) Take 1 tablet by mouth daily.     b complex vitamins capsule Take 1 capsule by mouth daily.     Blood Glucose Monitoring Suppl (ACCU-CHEK GUIDE ME) w/Device KIT Use as directed 1 kit 1   CRANBERRY PO Take 1 capsule by mouth daily.     Ferrous Sulfate (IRON PO) Take 1 tablet by mouth daily.     gabapentin  (NEURONTIN) 100 MG capsule TAKE 2 CAPSULES BY MOUTH AT BEDTIME. 60 capsule 0   GARLIC PO Take 1 capsule by mouth daily.     gentamicin cream (GARAMYCIN) 0.1 % Apply 1 Application topically 2 (two) times daily. (Patient taking differently: Apply 1 Application topically 2 (two) times daily as needed (rash).) 30 g 1   hydrochlorothiazide (HYDRODIURIL) 25 MG tablet TAKE 1 TABLET BY MOUTH EVERY DAY 90 tablet 0   hydrOXYzine (ATARAX) 10 MG tablet Take 1-2 tablets (10-20 mg total) by mouth 3 (three) times daily as needed for itching. 30 tablet 0   JARDIANCE 10 MG TABS tablet TAKE 1 TABLET BY MOUTH EVERY DAY BEFORE BREAKFAST 30 tablet 5   ketoconazole (NIZORAL) 2 % cream Apply 1 Application topically daily. For 2 weeks- if not improved- can stop and let me know (Patient taking differently: Apply 1 Application topically daily as needed for irritation. For 2 weeks- if not improved- can stop and let me know) 60 g 2   meloxicam (MOBIC) 7.5 MG tablet Take 1 tablet (7.5 mg total) by mouth daily. (  Patient taking differently: Take 7.5 mg by mouth daily as needed for pain.) 10 tablet 0   metFORMIN (GLUCOPHAGE) 1000 MG tablet TAKE 1 TABLET BY MOUTH TWICE A DAY WITH FOOD 180 tablet 0   metoprolol succinate (TOPROL-XL) 100 MG 24 hr tablet TAKE 1 TABLET BY MOUTH WITH OR IMMEDIATELY FOLLOWING A MEAL. 90 tablet 0   Naphazoline-Pheniramine (VISINE-A OP) Place 1 drop into both eyes daily as needed (dry eyes).     sildenafil (REVATIO) 20 MG tablet Take 1-5 tablets as needed. 45 tablet 5   sildenafil (VIAGRA) 100 MG tablet Take 1/2 to 1 tablet as needed for erectile dysfunction up to every 48 hours 10 tablet 5   simvastatin (ZOCOR) 10 MG tablet TAKE 1 TABLET BY MOUTH EVERY DAY 90 tablet 3   triamcinolone cream (KENALOG) 0.1 % Apply 1 Application topically 2 (two) times daily. Mix small amount equally with hand or body lotion/cream for itching. (Patient taking differently: Apply 1 Application topically 2 (two) times daily as  needed (itching). Mix small amount equally with hand or body lotion/cream for itching.) 30 g 0   TURMERIC PO Take 1 capsule by mouth daily.     VITAMIN D PO Take 1 capsule by mouth daily.     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  Constitutional:      General: He is not in acute distress.    Appearance: Normal appearance.  HENT:     Head: Normocephalic.  Cardiovascular:     Rate and Rhythm: Normal rate and regular rhythm.  Pulmonary:     Effort: Pulmonary effort is normal.     Breath sounds: Normal breath sounds.  Musculoskeletal:        General: Normal range of motion.     Cervical back: Normal range of motion.  Skin:    General: Skin is warm and dry.  Neurological:     Mental Status: He is alert and oriented to person, place, and time.  Psychiatric:        Mood and Affect: Mood normal.    There were no vitals taken for this visit. Wt Readings from Last 3 Encounters:  03/26/23 210 lb 6.9 oz (95.5 kg)  01/22/23 207 lb (93.9 kg)  01/11/23 207 lb (93.9 kg)       Dulce Sellar, NP

## 2023-03-27 NOTE — Telephone Encounter (Signed)
Patient Product/process development scientist completed.    The patient is insured through [place]. Patient has Medicare and is not eligible for a copay card, but may be able to apply for patient assistance or Medicare RX Payment Plan (Patient Must reach out to their plan, if eligible for payment plan), if available.    Ran test claim for Lantus Pen and the current 30 day co-pay is $35.00.  Ran test claim for [employer] and Requires Prior Authorization  Ran test claim for [employer] and Requires Prior Authorization  This test claim was processed through [employer]- copay amounts may vary at other pharmacies due to [employer], or as the patient moves through the different stages of their insurance plan.     Roland Earl, CPHT Pharmacy Technician III Certified Patient Advocate [facility] Patient Advocate Team Direct Number: [phone]  Fax: [phone]

## 2023-03-28 DIAGNOSIS — E1110 Type 2 diabetes mellitus with ketoacidosis without coma: Secondary | ICD-10-CM | POA: Diagnosis not present

## 2023-03-28 LAB — GLUCOSE, CAPILLARY
Glucose-Capillary: 289 mg/dL — ABNORMAL HIGH (ref 70–99)
Glucose-Capillary: 323 mg/dL — ABNORMAL HIGH (ref 70–99)
Glucose-Capillary: 176 mg/dL — ABNORMAL HIGH (ref 70–99)
Glucose-Capillary: 285 mg/dL — ABNORMAL HIGH (ref 70–99)
Glucose-Capillary: 203 mg/dL — ABNORMAL HIGH (ref 70–99)

## 2023-03-28 LAB — CBC WITH DIFFERENTIAL/PLATELET
Abs Immature Granulocytes: 0.29 10*3/uL — ABNORMAL HIGH (ref 0.00–0.07)
Basophils Absolute: 0 10*3/uL (ref 0.0–0.1)
Basophils Relative: 0 %
Eosinophils Absolute: 0 10*3/uL (ref 0.0–0.5)
Eosinophils Relative: 0 %
HCT: 27.3 % — ABNORMAL LOW (ref 39.0–52.0)
Hemoglobin: 8.8 g/dL — ABNORMAL LOW (ref 13.0–17.0)
Immature Granulocytes: 2 %
Lymphocytes Relative: 6 %
Lymphs Abs: 0.8 10*3/uL (ref 0.7–4.0)
MCH: 30.4 pg (ref 26.0–34.0)
MCHC: 32.2 g/dL (ref 30.0–36.0)
MCV: 94.5 fL (ref 80.0–100.0)
Monocytes Absolute: 0.7 10*3/uL (ref 0.1–1.0)
Monocytes Relative: 5 %
Neutro Abs: 11.4 10*3/uL — ABNORMAL HIGH (ref 1.7–7.7)
Neutrophils Relative %: 87 %
Platelets: 294 10*3/uL (ref 150–400)
RBC: 2.89 MIL/uL — ABNORMAL LOW (ref 4.22–5.81)
RDW: 14.1 % (ref 11.5–15.5)
Smear Review: NORMAL
WBC: 13.2 10*3/uL — ABNORMAL HIGH (ref 4.0–10.5)
nRBC: 0.2 % (ref 0.0–0.2)

## 2023-03-28 LAB — COMPREHENSIVE METABOLIC PANEL
ALT: 44 U/L (ref 0–44)
AST: 109 U/L — ABNORMAL HIGH (ref 15–41)
Albumin: 1.5 g/dL — ABNORMAL LOW (ref 3.5–5.0)
Alkaline Phosphatase: 77 U/L (ref 38–126)
Anion gap: 11 (ref 5–15)
BUN: 69 mg/dL — ABNORMAL HIGH (ref 8–23)
CO2: 24 mmol/L (ref 22–32)
Calcium: 8.2 mg/dL — ABNORMAL LOW (ref 8.9–10.3)
Chloride: 104 mmol/L (ref 98–111)
Creatinine, Ser: 1.93 mg/dL — ABNORMAL HIGH (ref 0.61–1.24)
GFR, Estimated: 36 mL/min — ABNORMAL LOW (ref >60)
Glucose, Bld: 320 mg/dL — ABNORMAL HIGH (ref 70–99)
Potassium: 3.8 mmol/L (ref 3.5–5.1)
Sodium: 139 mmol/L (ref 135–145)
Total Bilirubin: 0.7 mg/dL (ref 0.0–1.2)
Total Protein: 5.8 g/dL — ABNORMAL LOW (ref 6.5–8.1)

## 2023-03-28 LAB — PHOSPHORUS: Phosphorus: 2.7 mg/dL (ref 2.5–4.6)

## 2023-03-28 LAB — MAGNESIUM: Magnesium: 2.4 mg/dL (ref 1.7–2.4)

## 2023-03-28 MED ORDER — INSULIN GLARGINE-YFGN 100 UNIT/ML ~~LOC~~ SOLN
40.0000 [IU] | Freq: Every day | SUBCUTANEOUS | Status: DC
  Administered 2023-03-29 – 2023-04-02 (×5): 40 [IU] via SUBCUTANEOUS
  Filled 2023-03-28 (×5): qty 0.4

## 2023-03-28 MED ORDER — INSULIN GLARGINE-YFGN 100 UNIT/ML ~~LOC~~ SOLN
10.0000 [IU] | Freq: Once | SUBCUTANEOUS | Status: AC
  Administered 2023-03-28: 10 [IU] via SUBCUTANEOUS
  Filled 2023-03-28: qty 0.1

## 2023-03-28 MED ORDER — ENOXAPARIN SODIUM 40 MG/0.4ML IJ SOSY: 40.0000 mg | PREFILLED_SYRINGE | INTRAMUSCULAR | Status: DC

## 2023-03-28 MED ORDER — LACTATED RINGERS IV SOLN
INTRAVENOUS | Status: DC
Start: 2023-03-28 — End: 2023-03-29

## 2023-03-28 NOTE — Evaluation (Signed)
Occupational Therapy Evaluation Patient Details Name: Mark Stolzman Sr. MRN: [id number] DOB: 21-Feb-1951 Today's Date: 03/28/2023   History of Present Illness 73 y.o. male admitted 03/26/23 with generalized weakness, N/V, fall. Workup for DKA, AKI. PMH includes HTN, DM2 HLD.   Clinical Impression   Terique was evaluated s/p the above admission list. He is indep, active and lives with his wife at baseline. Upon evaluation the pt was limited by general malaise, weakness, poor activity tolerance, dizziness with standing, unsteady gait, slowed processing and decreased initiation. Overall he needed encouragement from his wife to participate and step-by-step cues for all tasks. He required min A to stand from the recliner and mod A to correct LOB after dizziness. He only tolerated ~63ft ambulation prior to needing a sitting rest break. Due to the deficits listed below the pt also needs up to max A for LB ADLs and min A for UB ADLs with significantly increased time and cues. Anticipate pt's BP may be dropping with positional changes, RN made aware, he would benefit from orthostatic assessment. Anticipate pt will progress well acutely due to strong baseline. Pt will benefit from continued acute OT services and skilled inpatient follow up therapy, <3 hours/day.         If plan is discharge home, recommend the following: A lot of help with walking and/or transfers;A lot of help with bathing/dressing/bathroom;Assistance with cooking/housework;Assistance with feeding;Direct supervision/assist for medications management;Direct supervision/assist for financial management;Assist for transportation;Help with stairs or ramp for entrance    Functional Status Assessment  Patient has had a recent decline in their functional status and demonstrates the ability to make significant improvements in function in a reasonable and predictable amount of time.  Equipment Recommendations  Other (comment) (pending acute  progress)       Precautions / Restrictions Precautions Precautions: Fall Restrictions Weight Bearing Restrictions Per Provider Order: No      Mobility Bed Mobility Overal bed mobility: Needs Assistance             General bed mobility comments: OOB upon arrival    Transfers Overall transfer level: Needs assistance Equipment used: Rolling walker (2 wheels) Transfers: Sit to/from Stand Sit to Stand: Min assist           General transfer comment: VCs for hand placement. Pt took ~6 steps with RW and started to become unsteady and more lethargic - question orthostatic?      Balance Overall balance assessment: Needs assistance Sitting-balance support: No upper extremity supported, Feet supported Sitting balance-Leahy Scale: Fair     Standing balance support: Single extremity supported Standing balance-Leahy Scale: Poor                             ADL either performed or assessed with clinical judgement   ADL Overall ADL's : Needs assistance/impaired Eating/Feeding: Independent   Grooming: Set up;Sitting   Upper Body Bathing: Minimal assistance   Lower Body Bathing: Maximal assistance;Sit to/from stand   Upper Body Dressing : Minimal assistance;Sitting   Lower Body Dressing: Maximal assistance;Sit to/from stand   Toilet Transfer: Minimal assistance;Stand-pivot;Rolling walker (2 wheels);BSC/3in1   Toileting- Clothing Manipulation and Hygiene: Maximal assistance;Sit to/from stand       Functional mobility during ADLs: Minimal assistance;Rolling walker (2 wheels) General ADL Comments: limited by significant weakness, poor activity tolerance and unsteady gait     Vision Baseline Vision/History: 0 No visual deficits Vision Assessment?: No apparent visual deficits  Perception Perception: Not tested       Praxis Praxis: Not tested       Pertinent Vitals/Pain Pain Assessment Pain Assessment: No/denies pain     Extremity/Trunk  Assessment Upper Extremity Assessment Upper Extremity Assessment: Generalized weakness   Lower Extremity Assessment Lower Extremity Assessment: Generalized weakness   Cervical / Trunk Assessment Cervical / Trunk Assessment: Kyphotic   Communication Communication Communication: Other (comment)   Cognition Arousal: Alert Behavior During Therapy: Flat affect Overall Cognitive Status: Impaired/Different from baseline     General Comments: not formally assessed, needed cues and encouragement for all tasks. Minimal verbalizations made despite being asked direct questions.     General Comments  pt's wife present and providing encouragement for pt to participate            Home Living Family/patient expects to be discharged to:: Private residence Living Arrangements: Spouse/significant other Available Help at Discharge: Family;Available 24 hours/day Type of Home: House Home Access: Stairs to enter [employer] of Steps: 2-4 Entrance Stairs-Rails: Right Home Layout: Two level;Able to live on main level with bedroom/bathroom Alternate Level Stairs-Number of Steps: flight Alternate Level Stairs-Rails: Right Bathroom Shower/Tub: Producer, television/film/video: Standard Bathroom Accessibility: Yes How Accessible: Accessible via wheelchair Home Equipment: None   Additional Comments: lives with wife      Prior Functioning/Environment Prior Level of Function : Independent/Modified Independent;Driving             Mobility Comments: independent without DME, drives. retired from work as Animal nutritionist; enjoys staying active, running errands, visiting with friends, up/down flight of stairs multiple times/day ADLs Comments: independent        OT Problem List: Decreased strength;Decreased range of motion;Decreased activity tolerance;Impaired balance (sitting and/or standing);Decreased safety awareness      OT Treatment/Interventions: Self-care/ADL  training;Therapeutic exercise;DME and/or AE instruction;Therapeutic activities;Patient/family education;Balance training    OT Goals(Current goals can be found in the care plan section) Acute Rehab OT Goals Patient Stated Goal: to feel better OT Goal Formulation: With patient Time For Goal Achievement: 04/11/23 Potential to Achieve Goals: Good ADL Goals Pt Will Perform Lower Body Dressing: Independently;sit to/from stand Pt Will Transfer to Toilet: Independently;ambulating;regular height toilet Additional ADL Goal #1: Pt will demonstrate improved activity tolerance as evidenced by completing 10 minutes of OOB functional activity. Additional ADL Goal #2: Pt will demonstrate increased safety awareness by identifying signs of decrease in BP independently when engaging in functional task  OT Frequency: Min 1X/week       AM-PAC OT "6 Clicks" Daily Activity     Outcome Measure Help from another person eating meals?: None Help from another person taking care of personal grooming?: A Little Help from another person toileting, which includes using toliet, bedpan, or urinal?: A Lot Help from another person bathing (including washing, rinsing, drying)?: A Lot Help from another person to put on and taking off regular upper body clothing?: A Little Help from another person to put on and taking off regular lower body clothing?: A Lot 6 Click Score: 16   End of Session Equipment Utilized During Treatment: Rolling walker (2 wheels) Nurse Communication: Mobility status (assess BP)  Activity Tolerance: Patient limited by lethargy;Patient limited by fatigue Patient left: in chair;with call bell/phone within reach;with family/visitor present  OT Visit Diagnosis: Unsteadiness on feet (R26.81);Other abnormalities of gait and mobility (R26.89);Muscle weakness (generalized) (M62.81)                Time: 7829-5621 OT Time Calculation (min):  22 min Charges:  OT General Charges $OT Visit: 1 Visit OT  Evaluation $OT Eval Moderate Complexity: 1 Mod  Derenda Mis, OTR/L Acute Rehabilitation Services Office [phone] Secure Chat Communication Preferred   Donia Pounds 03/28/2023, 3:21 PM

## 2023-03-28 NOTE — [place] FL2 (Signed)
[place] MEDICAID FL2 LEVEL OF CARE FORM     IDENTIFICATION  Patient Name: Mark Tripp Sr. Birthdate: [birth year] Sex: male Admission Date (Current Location): 03/26/2023  [facility] and [place] Number:  Producer, television/film/video and Address:  [facility] [place], [address], [place], [place] [address]      Provider Number: [id number]  Attending Physician Name and Address:  Dorcas Carrow, MD  Relative Name and Phone Number:  Seiji Letbetter; Spouse; [phone]    Current Level of Care: Hospital Recommended Level of Care: Skilled Nursing Facility Prior Approval Number:    Date Approved/Denied:   PASRR Number: [id number]  Discharge Plan: SNF    Current Diagnoses: Patient Active Problem List   Diagnosis Date Noted   DKA, type 2, not at goal [facility]) 03/27/2023   DKA (diabetic ketoacidosis) (HCC) 03/26/2023   Arthritis of left elbow 01/11/2023   Olecranon bursitis of left elbow 07/27/2021   History of small bowel obstruction 01/18/2021   Bilateral primary osteoarthritis of knee 04/06/2020   Right-sided low back pain with right-sided sciatica 01/01/2020   Aortic atherosclerosis (HCC) 12/11/2019   Hyperlipidemia associated with type 2 diabetes mellitus (HCC) 02/21/2017   Erectile dysfunction 09/01/2014   Anemia, chronic disease 07/14/2009   Diabetes mellitus type II, controlled (HCC) 11/25/2006   Essential hypertension 11/25/2006    Orientation RESPIRATION BLADDER Height & Weight     Self, Time, Situation, Place  Normal (Room Air) Continent Weight: 210 lb 6.9 oz (95.5 kg) Height:  6' 5.5" (196.9 cm)  BEHAVIORAL SYMPTOMS/MOOD NEUROLOGICAL BOWEL NUTRITION STATUS      Continent Diet (Please see dc summary)  AMBULATORY STATUS COMMUNICATION OF NEEDS Skin   Extensive Assist Verbally Normal                       Personal Care Assistance Level of Assistance  Bathing, Feeding, Dressing Bathing Assistance: Maximum assistance Feeding assistance:  Maximum assistance Dressing Assistance: Maximum assistance     Functional Limitations Info  Sight Sight Info: Impaired (R and L (Eyeglasses))        SPECIAL CARE FACTORS FREQUENCY  PT (By licensed PT), OT (By licensed OT)     PT Frequency: 5x OT Frequency: 5x            Contractures Contractures Info: Not present    Additional Factors Info  Code Status, Allergies, Insulin Sliding Scale Code Status Info: Full Code Allergies Info: NKA   Insulin Sliding Scale Info: Please see dc summary       Current Medications (03/28/2023):  This is the current hospital active medication list Current Facility-Administered Medications  Medication Dose Route Frequency Provider Last Rate Last Admin   acetaminophen (TYLENOL) tablet 650 mg  650 mg Oral Q6H PRN Alan Mulder, MD   650 mg at 03/28/23 0201   Or   acetaminophen (TYLENOL) suppository 650 mg  650 mg Rectal Q6H PRN Alan Mulder, MD       dextrose 50 % solution 0-50 mL  0-50 mL Intravenous PRN Alan Mulder, MD       Melene Muller ON 03/29/2023] enoxaparin (LOVENOX) injection 40 mg  40 mg Subcutaneous Q24H Pierce, Dwayne A, RPH       gabapentin (NEURONTIN) capsule 200 mg  200 mg Oral QHS Alan Mulder, MD   200 mg at 03/27/23 2145   guaiFENesin-dextromethorphan (ROBITUSSIN DM) 100-10 MG/5ML syrup 5 mL  5 mL Oral Q4H PRN Dorcas Carrow, MD   5 mL at  03/28/23 1259   insulin aspart (novoLOG) injection 0-15 Units  0-15 Units Subcutaneous TID WC Dorcas Carrow, MD   3 Units at 03/28/23 1109   insulin aspart (novoLOG) injection 0-5 Units  0-5 Units Subcutaneous QHS Dorcas Carrow, MD       Melene Muller ON 03/29/2023] insulin glargine-yfgn (SEMGLEE) injection 40 Units  40 Units Subcutaneous Daily Dorcas Carrow, MD       lactated ringers infusion   Intravenous Continuous Dorcas Carrow, MD 75 mL/hr at 03/28/23 1259 New Bag at 03/28/23 1259   metoprolol succinate (TOPROL-XL) 24 hr tablet 100 mg  100 mg Oral Daily Dorrell, Molly Maduro, MD   100 mg at  03/28/23 0808   ondansetron (ZOFRAN) tablet 4 mg  4 mg Oral Q6H PRN Alan Mulder, MD       Or   ondansetron (ZOFRAN) injection 4 mg  4 mg Intravenous Q6H PRN Alan Mulder, MD       simvastatin (ZOCOR) tablet 10 mg  10 mg Oral q1800 Alan Mulder, MD   10 mg at 03/27/23 1737     Discharge Medications: Please see discharge summary for a list of discharge medications.  Relevant Imaging Results:  Relevant Lab Results:   Additional Information SS# [id number]  Marliss Coots, LCSW

## 2023-03-28 NOTE — TOC Initial Note (Signed)
Transition of Care (TOC) - Initial/Assessment Note    Patient Details  Name: Mark Tritto Sr. MRN: [id number] Date of Birth: November 23, 1950  Transition of Care [facility]) CM/SW Contact:    Harriet Masson, RN Phone Number: 03/28/2023, 12:32 PM  Clinical Narrative:                 Spoke to patient and wife at bedside regarding transition needs.  Patient lives with wife and has no DME. Patient had a recent fall.  This RNCM will watch for PT, OT recs.  Patient is agreeable to Home health if recommended.  Address, Phone number and PCP verified.  Expected Discharge Plan: Home w Home Health Services Barriers to Discharge: Continued Medical Work up   Patient Goals and CMS Choice Patient states their goals for this hospitalization and ongoing recovery are:: return home          Expected Discharge Plan and Services   Discharge Planning Services: CM Consult   Living arrangements for the past 2 months: Single Family Home                                      Prior Living Arrangements/Services Living arrangements for the past 2 months: Single Family Home Lives with:: Spouse Patient language and need for interpreter reviewed:: Yes Do you feel safe going back to the place where you live?: Yes      Need for Family Participation in Patient Care: Yes (Comment) Care giver support system in place?: Yes (comment)   Criminal Activity/Legal Involvement Pertinent to Current Situation/Hospitalization: No - Comment as needed  Activities of Daily Living   ADL Screening (condition at time of admission) Independently performs ADLs?: Yes (appropriate for developmental age) Is the patient deaf or have difficulty hearing?: No Does the patient have difficulty seeing, even when wearing glasses/contacts?: No Does the patient have difficulty concentrating, remembering, or making decisions?: No  Permission Sought/Granted                  Emotional Assessment Appearance::  Appears stated age Attitude/Demeanor/Rapport: Engaged Affect (typically observed): Accepting Orientation: : Oriented to Self, Oriented to Place, Oriented to  Time, Oriented to Situation Alcohol / Substance Use: Not Applicable Psych Involvement: No (comment)  Admission diagnosis:  Hyperkalemia [E87.5] Gastroenteritis [K52.9] DKA (diabetic ketoacidosis) (HCC) [E11.10] DKA, type 2, not at goal [facility]) [E11.10] Diabetic ketoacidosis without coma associated with type 2 diabetes mellitus (HCC) [E11.10] Patient Active Problem List   Diagnosis Date Noted   DKA, type 2, not at goal [facility]) 03/27/2023   DKA (diabetic ketoacidosis) (HCC) 03/26/2023   Arthritis of left elbow 01/11/2023   Olecranon bursitis of left elbow 07/27/2021   History of small bowel obstruction 01/18/2021   Bilateral primary osteoarthritis of knee 04/06/2020   Right-sided low back pain with right-sided sciatica 01/01/2020   Aortic atherosclerosis (HCC) 12/11/2019   Hyperlipidemia associated with type 2 diabetes mellitus (HCC) 02/21/2017   Erectile dysfunction 09/01/2014   Anemia, chronic disease 07/14/2009   Diabetes mellitus type II, controlled (HCC) 11/25/2006   Essential hypertension 11/25/2006   PCP:  Shelva Majestic, MD Pharmacy:   [id number] [place], [place] - [address] [place] [address] Phone: [phone] Fax: [phone]     Social Drivers of Health (SDOH) Social History: SDOH Screenings   Food Insecurity: No Food Insecurity (03/27/2023)  Housing: Unknown (03/27/2023)  Transportation  Needs: No Transportation Needs (03/27/2023)  Utilities: Not At Risk (03/27/2023)  Depression (PHQ2-9): Low Risk  (01/22/2023)  Financial Resource Strain: Low Risk  (01/22/2023)  Physical Activity: Sufficiently Active (01/22/2023)  Social Connections: Socially Integrated (03/27/2023)  Stress: No Stress Concern Present (01/22/2023)  Tobacco Use: Low Risk  (03/26/2023)  Health Literacy: Adequate  Health Literacy (01/22/2023)   SDOH Interventions:     Readmission Risk Interventions     No data to display

## 2023-03-28 NOTE — Evaluation (Addendum)
Physical Therapy Evaluation Patient Details Name: Mark Carwell Sr. MRN: [id number] DOB: 02-27-1951 Today's Date: 03/28/2023  History of Present Illness  73 y.o. male admitted 03/26/23 with generalized weakness, N/V, fall. Workup for DKA, AKI. PMH includes HTN, DM2 HLD.   Clinical Impression  Pt presents with an overall decrease in functional mobility secondary to above. PTA, pt independent, active, retired from work, lives with supportive wife. Today, pt with flat affect and decreased interaction, requiring max encouragement and repeated cues to attempt OOB mobility. Pt sitting up then laying self back down, able to stand 1x with minA before sitting then laying self back down, which pt attributes to fatigue and dizziness. Seated BP 102/61, HR 110s. Increased time educ re: POC, activity recommendations, importance of OOB mobility. Recommend post-acute rehab services (< 3 hrs/day) to maximize functional mobility and independence prior to d/c home.       If plan is discharge home, recommend the following: A lot of help with walking and/or transfers;A lot of help with bathing/dressing/bathroom;Assistance with cooking/housework;Assist for transportation;Help with stairs or ramp for entrance   Can travel by private vehicle   Yes    Equipment Recommendations  (TBD)  Recommendations for Other Services   Mobility Specialist    Functional Status Assessment Patient has had a recent decline in their functional status and demonstrates the ability to make significant improvements in function in a reasonable and predictable amount of time.     Precautions / Restrictions Precautions Precautions: Fall Restrictions Weight Bearing Restrictions Per Provider Order: No      Mobility  Bed Mobility Overal bed mobility: Needs Assistance Bed Mobility: Supine to Sit, Sit to Supine     Supine to sit: Modified independent (Device/Increase time) Sit to supine: Modified independent (Device/Increase  time)   General bed mobility comments: pt reaching out hand for assist, requires encouragement to perform indep, ultimately able to perform 2x supine<>sit mod indep for increased time with bed flat though needing repeated cues to actually complete task    Transfers Overall transfer level: Needs assistance Equipment used: 1 person hand held assist Transfers: Sit to/from Stand Sit to Stand: Min assist, From elevated surface           General transfer comment: minA for HHA to elevate trunk from elevated bed height    Ambulation/Gait             Pre-gait activities: pt standing ~5 sec before sitting himself back down, would not stand up again    Stairs            Wheelchair Mobility     Tilt Bed    Modified Rankin (Stroke Patients Only)       Balance Overall balance assessment: Needs assistance Sitting-balance support: No upper extremity supported, Feet supported Sitting balance-Leahy Scale: Fair Sitting balance - Comments: prolonged sitting EOB with hand in head and elbows on knees requiring cues to sit up straight and keep eyes open, though pt frequently leaning back down   Standing balance support: Single extremity supported Standing balance-Leahy Scale: Poor Standing balance comment: single UE support for static standing; pt sitting before balance tested without UE support                             Pertinent Vitals/Pain Pain Assessment Pain Assessment: No/denies pain    Home Living Family/patient expects to be discharged to:: Private residence Living Arrangements: Spouse/significant other Available Help at Discharge: Family;Available  24 hours/day Type of Home: House Home Access: Stairs to enter Entrance Stairs-Rails: Right Entrance Stairs-Number of Steps: 2-4 Alternate Level Stairs-Number of Steps: flight Home Layout: Two level;Able to live on main level with bedroom/bathroom Home Equipment: None Additional Comments: lives with wife     Prior Function Prior Level of Function : Independent/Modified Independent;Driving             Mobility Comments: independent without DME, drives. retired from work as Animal nutritionist; enjoys staying active, running errands, visiting with friends, up/down flight of stairs multiple times/day ADLs Comments: independent     Extremity/Trunk Assessment   Upper Extremity Assessment Upper Extremity Assessment: Generalized weakness    Lower Extremity Assessment Lower Extremity Assessment: Generalized weakness       Communication   Communication Communication: Other (comment) (speaking softly)  Cognition Arousal: Alert Behavior During Therapy: Flat affect Overall Cognitive Status: Impaired/Different from baseline                                 General Comments: requires cues to keep eyes open and speak louder. slow to respond requiring frequent cues to complete task and engage in conversation. family reports this is abnormal for pt. difficult to determine true cognitive impairment vs fatigue/lethargy vs desire to participate        General Comments General comments (skin integrity, edema, etc.): pt's wife and daugther present. sounds like pt has been laying flat majority of hospital admission, has not been OOB and family reports eating very little. educ pt and family re: POC, activity recommendations, importance of OOB mobility, importance of caloric intake, potential discharge needs including SNF since pt currently unsafe to return home with current apparent mobility deficits. sitting BP 102/61, HR 100s    Exercises     Assessment/Plan    PT Assessment Patient needs continued PT services  PT Problem List Decreased strength;Decreased activity tolerance;Decreased balance;Decreased mobility;Decreased cognition;Decreased knowledge of use of DME;Cardiopulmonary status limiting activity       PT Treatment Interventions DME instruction;Gait training;Stair  training;Functional mobility training;Therapeutic activities;Therapeutic exercise;Balance training;Patient/family education;Cognitive remediation    PT Goals (Current goals can be found in the Care Plan section)  Acute Rehab PT Goals Patient Stated Goal: lay back down, "I'll do it, but I want to rest longer" PT Goal Formulation: With patient Time For Goal Achievement: 04/11/23 Potential to Achieve Goals: Good    Frequency Min 1X/week     Co-evaluation               AM-PAC PT "6 Clicks" Mobility  Outcome Measure Help needed turning from your back to your side while in a flat bed without using bedrails?: A Little Help needed moving from lying on your back to sitting on the side of a flat bed without using bedrails?: A Little Help needed moving to and from a bed to a chair (including a wheelchair)?: A Little Help needed standing up from a chair using your arms (e.g., wheelchair or bedside chair)?: A Little Help needed to walk in hospital room?: Total Help needed climbing 3-5 steps with a railing? : Total 6 Click Score: 14    End of Session   Activity Tolerance: Patient limited by fatigue Patient left: in bed;with call bell/phone within reach;with bed alarm set;with family/visitor present Nurse Communication: Mobility status;Other (comment) (pt agreeable to get to recliner later, RN to assist) PT Visit Diagnosis: Other abnormalities of gait and mobility (R26.89);Muscle weakness (  generalized) (M62.81)    Time: 2956-2130 PT Time Calculation (min) (ACUTE ONLY): 28 min   Charges:   PT Evaluation $PT Eval Moderate Complexity: 1 Mod PT Treatments $Therapeutic Activity: 8-22 mins PT General Charges $$ ACUTE PT VISIT: 1 Visit       Ina Homes, PT, DPT Acute Rehabilitation Services  Personal: Secure Chat Rehab Office: [phone]  Malachy Chamber 03/28/2023, 12:54 PM

## 2023-03-28 NOTE — Inpatient Diabetes Management (Signed)
Inpatient Diabetes Program Recommendations  AACE/ADA: New Consensus Statement on Inpatient Glycemic Control (2015)  Target Ranges:  Prepandial:   less than 140 mg/dL      Peak postprandial:   less than 180 mg/dL (1-2 hours)      Critically ill patients:  140 - 180 mg/dL   Lab Results  Component Value Date   GLUCAP 176 (H) 03/28/2023   HGBA1C 10.3 (H) 03/27/2023    Review of Glycemic Control  Diabetes history: DM 2 Outpatient Diabetes medications: Jardiance 10 mg Daily, Metformin 1000 mg bid Current orders for Inpatient glycemic control:  Semglee 40 units Daily Novolog 0-15 units tid + hs  Discharge Recommendations: Other recommendations: Freestyle Libre 3 Sensor order # [id number] Long acting recommendations: Insulin Glargine (LANTUS) Solostar Pen TBD  Supply/Referral recommendations: Glucometer Test strips Lancet device Lancets Pen needles - standard   Use Adult Diabetes Insulin Treatment Post Discharge order set.  -    Note: pt on SGLT-2 which can precipitate DKA, consider d/cing it at discharge and have pt follow up with prescriber. Family also had concerns with the medication in regards to pt weight loss recently.   Spoke with pt and wife at bedside. Walked pt and wife through setting up the [place] app and [employer] for family members. Walked them through applying a sensor to the left arm. Initiated the setup for the sensor via the app. Reviewed operating the insulin pen for discharge.   Thanks,  Christena Deem RN, MSN, BC-ADM Inpatient Diabetes Coordinator Team Pager [phone] (8a-5p)

## 2023-03-28 NOTE — Plan of Care (Signed)

## 2023-03-28 NOTE — Plan of Care (Signed)

## 2023-03-28 NOTE — Progress Notes (Addendum)
PROGRESS NOTE    Mark Yarger Sr.  PIR:[id number] DOB: [birth year] DOA: 03/26/2023 PCP: Shelva Majestic, MD    Brief Narrative:  73 year old gentleman with history of type 2 diabetes on metformin and Jardiance, hypertension and hyperlipidemia presented to the emergency department with nausea vomiting and diarrhea, extreme generalized weakness and fall at home.  He has been having intermittent abdominal pain for the last several days.  He also suffered recent viral-like illnesses.  He fell in the bathroom yesterday morning.  EMS was called.  Patient was brought to the ER.  He was found with blood glucose of 567, respiratory virus panel negative.  Potassium 6.7, bicarb 20, creatinine 2.9 with a baseline creatinine about 1.6.  Beta-hydroxybutyrate 4.26.  Given IV fluids, started on insulin infusion and admitted to the hospital. 1/22, converted to subcu insulin.  Blood sugars are still elevated.  Subjective:  Patient seen and examined.  No overnight events.  Discussed with him about insulin to go home with and he was slightly surprised about that.  We discussed that given kidney dysfunction, metformin and Jardiance may not help him or may cause complications.  He is agreeable to learning insulin techniques with help of his wife and daughter. Discussed with diabetic coordinator today to educate insulin techniques with families.  Blood sugar is still more than 300. Patient had temperature 101 overnight, currently afebrile. Patient tells me he feels very weak.   Assessment & Plan:   Diabetic ketoacidosis, presented with pH 7.23, anion gap 21, beta hydroxybutyrate more than 4. Type 2 diabetes, uncontrolled with hyperglycemia and ketoacidosis.  Clinically improved.  Received insulin infusion.  Able to eat regular diet.  Started on subcu insulin.  Received 30 units.  Blood sugar still elevated.  Will increase dose of insulin to 40 units today.  Hemoglobin A1c is 10.  Patient on metformin  and Jardiance at home.  With progressive kidney disease, it will be challenging to maintain him on these medications.  Discussed with patient and family about going home on insulin regimen and they agree. Starting on insulin, will uptitrate gradually.  Insulin administration education and monitoring today.  Appreciate diabetic coordinator seeing patient.  Will monitor over next 24 hours and anticipate home insulin treatment.  Acute kidney injury on CKD stage IIIb: Recent known baseline creatinine of 1.6.  Probably prerenal.  Treated with fluids and insulin.  Continue maintenance IV fluids for next 24 hours.   Creatinine improving.  1.93 today.  Continue maintenance fluid next 24 hours.  Recheck tomorrow morning.  Hyperkalemia,: As above.  Due to acidosis.  Normalized.  Hypertension: On losartan and hydrochlorothiazide at home.  Holding.  Continue metoprolol.   Adjust insulin doses.  Monitor blood sugars.  Work with PT OT.  Addendum: Patient is noted to have slow drop in hemoglobin. Base line per records 10-11. Presentation Hb of 14 was due to hemoconcentration. No obvious evidence of bleeding . He did have complaint of abdominal pain last week. Stool reported clear. Had progressive CKD and probably anemia of chronic disease. - check FOBT, Iron panel and B12 - recheck Hb in morning, if further drops will need UGI endoscopy.  Hold lovenox      DVT prophylaxis: enoxaparin (LOVENOX) injection 30 mg Start: 03/27/23 1000 SCDs Start: 03/26/23 2022   Code Status: Full code Family Communication: None today. Disposition Plan: Status is: Inpatient Remains inpatient appropriate because: Elevated blood sugars, on IV fluids     Consultants:  None  Procedures:  None  Antimicrobials:  None     Objective: Vitals:   03/28/23 0203 03/28/23 0320 03/28/23 0627 03/28/23 0752  BP:  132/71 (!) 148/76 136/77  Pulse: (!) 107 (!) 105 (!) 102 (!) 101  Resp:  18  18  Temp:  98 F (36.7 C) 98.6  F (37 C) 98 F (36.7 C)  TempSrc:  Oral Oral Oral  SpO2: 92% 94% 93% 91%  Weight:      Height:        Intake/Output Summary (Last 24 hours) at 03/28/2023 1148 Last data filed at 03/28/2023 1110 Gross per 24 hour  Intake 728.53 ml  Output 3590 ml  Net -2861.47 ml   Filed Weights   03/26/23 1938  Weight: 95.5 kg    Examination:  General exam: Appears calm and comfortable.  Interactive. Respiratory system: Clear to auscultation. Respiratory effort normal. Cardiovascular system: S1 & S2 heard, RRR.  Gastrointestinal system: Soft.  Nontender.  Bowel sound present. Central nervous system: Alert and oriented. No focal neurological deficits. Extremities: Symmetric 5 x 5 power. Skin: No rashes, lesions or ulcers Psychiatry: Judgement and insight appear normal. Mood & affect appropriate.     Data Reviewed: I have personally reviewed following labs and imaging studies  CBC: Recent Labs  Lab 03/26/23 1749 03/26/23 1809 03/28/23 0513  WBC 15.8*  --  13.2*  NEUTROABS  --   --  11.4*  HGB 12.9* 14.3 8.8*  HCT 40.5 42.0 27.3*  MCV 96.2  --  94.5  PLT 350  --  294   Basic Metabolic Panel: Recent Labs  Lab 03/26/23 1749 03/26/23 1809 03/26/23 1954 03/27/23 0204 03/27/23 0541 03/28/23 0513  NA 136 134* 137 142 144  146* 139  K 6.7* 6.1* 5.1 4.3 4.1  4.1 3.8  CL 95*  --  97* 104 106  107 104  CO2 20*  --  20* 23 26  26 24   GLUCOSE 603*  --  580* 279* 247*  237* 320*  BUN 98*  --  104* 106* 110*  109* 69*  CREATININE 2.95*  --  2.83* 2.52* 2.38*  2.34* 1.93*  CALCIUM 9.3  --  8.8* 8.8* 8.8*  8.8* 8.2*  MG  --   --   --   --   --  2.4  PHOS  --   --   --   --   --  2.7   GFR: Estimated Creatinine Clearance: 42.9 mL/min (A) (by C-G formula based on SCr of 1.93 mg/dL (H)). Liver Function Tests: Recent Labs  Lab 03/26/23 1749 03/28/23 0513  AST 57* 109*  ALT 39 44  ALKPHOS 106 77  BILITOT 1.4* 0.7  PROT 7.3 5.8*  ALBUMIN 2.1* 1.5*   No results for  input(s): "LIPASE", "AMYLASE" in the last 168 hours. No results for input(s): "AMMONIA" in the last 168 hours. Coagulation Profile: No results for input(s): "INR", "PROTIME" in the last 168 hours. Cardiac Enzymes: No results for input(s): "CKTOTAL", "CKMB", "CKMBINDEX", "TROPONINI" in the last 168 hours. BNP (last 3 results) No results for input(s): "PROBNP" in the last 8760 hours. HbA1C: Recent Labs    03/27/23 0204  HGBA1C 10.3*   CBG: Recent Labs  Lab 03/27/23 1710 03/27/23 2103 03/28/23 0417 03/28/23 0752 03/28/23 1103  GLUCAP 251* 191* 289* 323* 176*   Lipid Profile: No results for input(s): "CHOL", "HDL", "LDLCALC", "TRIG", "CHOLHDL", "LDLDIRECT" in the last 72 hours. Thyroid Function Tests: No results for input(s): "TSH", "T4TOTAL", "FREET4", "T3FREE", "THYROIDAB" in  the last 72 hours. Anemia Panel: No results for input(s): "VITAMINB12", "FOLATE", "FERRITIN", "TIBC", "IRON", "RETICCTPCT" in the last 72 hours. Sepsis Labs: No results for input(s): "PROCALCITON", "LATICACIDVEN" in the last 168 hours.  Recent Results (from the past 240 hours)  Resp panel by RT-PCR (RSV, Flu A&B, Covid) Anterior Nasal Swab     Status: None   Collection Time: 03/26/23  5:00 PM   Specimen: Anterior Nasal Swab  Result Value Ref Range Status   SARS Coronavirus 2 by RT PCR NEGATIVE NEGATIVE Final   Influenza A by PCR NEGATIVE NEGATIVE Final   Influenza B by PCR NEGATIVE NEGATIVE Final    Comment: (NOTE) The Xpert Xpress SARS-CoV-2/FLU/RSV plus assay is intended as an aid in the diagnosis of influenza from Nasopharyngeal swab specimens and should not be used as a sole basis for treatment. Nasal washings and aspirates are unacceptable for Xpert Xpress SARS-CoV-2/FLU/RSV testing.  Fact Sheet for Patients: BloggerCourse.com  Fact Sheet for Healthcare Providers: SeriousBroker.it  This test is not yet approved or cleared by the [place] FDA and has been authorized for detection and/or diagnosis of SARS-CoV-2 by FDA under an Emergency Use Authorization (EUA). This EUA will remain in effect (meaning this test can be used) for the duration of the COVID-19 declaration under Section 564(b)(1) of the Act, 21 U.S.C. section 360bbb-3(b)(1), unless the authorization is terminated or revoked.     Resp Syncytial Virus by PCR NEGATIVE NEGATIVE Final    Comment: (NOTE) Fact Sheet for Patients: BloggerCourse.com  Fact Sheet for Healthcare Providers: SeriousBroker.it  This test is not yet approved or cleared by the [place] FDA and has been authorized for detection and/or diagnosis of SARS-CoV-2 by FDA under an Emergency Use Authorization (EUA). This EUA will remain in effect (meaning this test can be used) for the duration of the COVID-19 declaration under Section 564(b)(1) of the Act, 21 U.S.C. section 360bbb-3(b)(1), unless the authorization is terminated or revoked.  Performed at [facility], [address], [place], [place] [address]          Radiology Studies: No results found.      Scheduled Meds:  enoxaparin (LOVENOX) injection  30 mg Subcutaneous Q24H   gabapentin  200 mg Oral QHS   insulin aspart  0-15 Units Subcutaneous TID WC   insulin aspart  0-5 Units Subcutaneous QHS   insulin glargine-yfgn  10 Units Subcutaneous Once   [START ON 03/29/2023] insulin glargine-yfgn  40 Units Subcutaneous Daily   metoprolol succinate  100 mg Oral Daily   simvastatin  10 mg Oral q1800   Continuous Infusions:  lactated ringers       LOS: 2 days    Time spent: 40 minutes    Dorcas Carrow, MD [facility]

## 2023-03-28 NOTE — TOC Progression Note (Signed)
Transition of Care (TOC) - Progression Note    Patient Details  Name: Mark Singerman Sr. MRN: [id number] Date of Birth: 11/10/1950  Transition of [facility]) CM/SW Contact  Marliss Coots, LCSW Phone Number: 03/28/2023, 3:11 PM  Clinical Narrative:     3:11 PM CSW introduced herself and role to patient at bedside. Patient's spouse, Sheral Flow, was also present at bedside. Patient consented CSW to speak in front of spouse. CSW informed patient and Sheral Flow of physical therapy recommendation of patient discharge to SNF. CSW also provided education on SNF process and insurance coverage. Patient and Sheral Flow consented CSW to submit referral to SNFs in [facility]  Expected Discharge Plan: Skilled Nursing Facility Barriers to Discharge: Continued Medical Work up, SNF Pending bed offer  Expected Discharge Plan and Services In-house Referral: Clinical Social Work Discharge Planning Services: CM Consult Post Acute Care Choice: Skilled Nursing Facility Living arrangements for the past 2 months: Single Family Home                                       Social Determinants of Health (SDOH) Interventions SDOH Screenings   Food Insecurity: No Food Insecurity (03/27/2023)  Housing: Unknown (03/27/2023)  Transportation Needs: No Transportation Needs (03/27/2023)  Utilities: Not At Risk (03/27/2023)  Depression (PHQ2-9): Low Risk  (01/22/2023)  Financial Resource Strain: Low Risk  (01/22/2023)  Physical Activity: Sufficiently Active (01/22/2023)  Social Connections: Socially Integrated (03/27/2023)  Stress: No Stress Concern Present (01/22/2023)  Tobacco Use: Low Risk  (03/26/2023)  Health Literacy: Adequate Health Literacy (01/22/2023)    Readmission Risk Interventions     No data to display

## 2023-03-29 DIAGNOSIS — E1110 Type 2 diabetes mellitus with ketoacidosis without coma: Secondary | ICD-10-CM | POA: Diagnosis not present

## 2023-03-29 LAB — CBC WITH DIFFERENTIAL/PLATELET
Abs Immature Granulocytes: 0.23 10*3/uL — ABNORMAL HIGH (ref 0.00–0.07)
Basophils Absolute: 0.1 10*3/uL (ref 0.0–0.1)
Basophils Relative: 0 %
Eosinophils Absolute: 0.2 10*3/uL (ref 0.0–0.5)
Eosinophils Relative: 1 %
HCT: 27.4 % — ABNORMAL LOW (ref 39.0–52.0)
Hemoglobin: 8.7 g/dL — ABNORMAL LOW (ref 13.0–17.0)
Immature Granulocytes: 2 %
Lymphocytes Relative: 8 %
Lymphs Abs: 1 10*3/uL (ref 0.7–4.0)
MCH: 30.1 pg (ref 26.0–34.0)
MCHC: 31.8 g/dL (ref 30.0–36.0)
MCV: 94.8 fL (ref 80.0–100.0)
Monocytes Absolute: 0.6 10*3/uL (ref 0.1–1.0)
Monocytes Relative: 5 %
Neutro Abs: 9.9 10*3/uL — ABNORMAL HIGH (ref 1.7–7.7)
Neutrophils Relative %: 84 %
Platelets: 320 10*3/uL (ref 150–400)
RBC: 2.89 MIL/uL — ABNORMAL LOW (ref 4.22–5.81)
RDW: 14.5 % (ref 11.5–15.5)
WBC: 11.9 10*3/uL — ABNORMAL HIGH (ref 4.0–10.5)
nRBC: 0.3 % — ABNORMAL HIGH (ref 0.0–0.2)

## 2023-03-29 LAB — GLUCOSE, CAPILLARY
Glucose-Capillary: 173 mg/dL — ABNORMAL HIGH (ref 70–99)
Glucose-Capillary: 173 mg/dL — ABNORMAL HIGH (ref 70–99)
Glucose-Capillary: 299 mg/dL — ABNORMAL HIGH (ref 70–99)
Glucose-Capillary: 116 mg/dL — ABNORMAL HIGH (ref 70–99)

## 2023-03-29 LAB — BASIC METABOLIC PANEL
Anion gap: 10 (ref 5–15)
BUN: 43 mg/dL — ABNORMAL HIGH (ref 8–23)
CO2: 28 mmol/L (ref 22–32)
Calcium: 8.8 mg/dL — ABNORMAL LOW (ref 8.9–10.3)
Chloride: 111 mmol/L (ref 98–111)
Creatinine, Ser: 1.59 mg/dL — ABNORMAL HIGH (ref 0.61–1.24)
GFR, Estimated: 45 mL/min — ABNORMAL LOW (ref >60)
Glucose, Bld: 180 mg/dL — ABNORMAL HIGH (ref 70–99)
Potassium: 3.9 mmol/L (ref 3.5–5.1)
Sodium: 149 mmol/L — ABNORMAL HIGH (ref 135–145)

## 2023-03-29 LAB — IRON AND TIBC
Iron: 14 ug/dL — ABNORMAL LOW (ref 45–182)
Saturation Ratios: 9 % — ABNORMAL LOW (ref 17.9–39.5)
TIBC: 155 ug/dL — ABNORMAL LOW (ref 250–450)
UIBC: 141 ug/dL

## 2023-03-29 LAB — VITAMIN B12: Vitamin B-12: 1448 pg/mL — ABNORMAL HIGH (ref 180–914)

## 2023-03-29 LAB — FERRITIN: Ferritin: 3303 ng/mL — ABNORMAL HIGH (ref 24–336)

## 2023-03-29 MED ORDER — BOOST PLUS PO LIQD
237.0000 mL | Freq: Two times a day (BID) | ORAL | Status: DC
  Administered 2023-03-29 – 2023-04-02 (×5): 237 mL via ORAL
  Filled 2023-03-29 (×9): qty 237

## 2023-03-29 MED ORDER — LACTATED RINGERS IV SOLN: INTRAVENOUS | Status: AC

## 2023-03-29 MED ORDER — NYSTATIN 100000 UNIT/ML MT SUSP
5.0000 mL | Freq: Four times a day (QID) | OROMUCOSAL | Status: DC
  Administered 2023-03-29 – 2023-04-02 (×15): 500000 [IU] via ORAL
  Filled 2023-03-29 (×20): qty 5

## 2023-03-29 MED ORDER — ADULT MULTIVITAMIN W/MINERALS CH
1.0000 | ORAL_TABLET | Freq: Every day | ORAL | Status: DC
  Administered 2023-03-29 – 2023-04-02 (×5): 1 via ORAL
  Filled 2023-03-29 (×5): qty 1

## 2023-03-29 NOTE — Inpatient Diabetes Management (Signed)
Inpatient Diabetes Program Recommendations  AACE/ADA: New Consensus Statement on Inpatient Glycemic Control (2015)  Target Ranges:  Prepandial:   less than 140 mg/dL      Peak postprandial:   less than 180 mg/dL (1-2 hours)      Critically ill patients:  140 - 180 mg/dL   Lab Results  Component Value Date   GLUCAP 173 (H) 03/29/2023   HGBA1C 10.3 (H) 03/27/2023    Review of Glycemic Control  Diabetes history: DM 2 Outpatient Diabetes medications: Jardiance 10 mg Daily, Metformin 1000 mg bid Current orders for Inpatient glycemic control:  Semglee 40 units Daily Novolog 0-15 units tid + hs  Discharge Recommendations: Other recommendations: Freestyle Libre 3 Sensor order # [id number] Long acting recommendations: Insulin Glargine (LANTUS) Solostar Pen TBD  Supply/Referral recommendations: Glucometer Test strips Lancet device Lancets Pen needles - standard  Would also be okay for pt to continue metformin  Use Adult Diabetes Insulin Treatment Post Discharge order set.  -    Note: pt on SGLT-2 which can precipitate DKA, consider d/cing it at discharge and have pt follow up with prescriber. Family also had concerns with the medication in regards to pt weight loss recently.   Spoke with pt and wife at bedside. FSL3 app working great, have some questions on diet and supplements. They are comfortable going home to manage Diabetes.   Thanks,  Christena Deem RN, MSN, BC-ADM Inpatient Diabetes Coordinator Team Pager [phone] (8a-5p)

## 2023-03-29 NOTE — Progress Notes (Signed)
Mobility Specialist Progress Note:   03/29/23 1248  Mobility  Activity Ambulated with assistance in room;Transferred from bed to chair  Level of Assistance Standby assist, set-up cues, supervision of patient - no hands on  Assistive Device Front wheel walker  Distance Ambulated (ft) 10 ft  Activity Response Tolerated well  Mobility Referral Yes  Mobility visit 1 Mobility  Mobility Specialist Start Time (ACUTE ONLY) 1145  Mobility Specialist Stop Time (ACUTE ONLY) 1205  Mobility Specialist Time Calculation (min) (ACUTE ONLY) 20 min   During Mobility:    Supine:121/68 BP   Sitting: 104/61 BP   Standing: 88/67 BP  Pt received in bed, agreeable to mobility. Orthostatic vitals taken before ambulation. See results above. Pt found symptomatic c/o dizziness while standing. Pt unable to stand for 3 minute trial requesting to sit back down. Rated dizziness 5/10. After recovery pt was able to ambulate around bed to chair without fault with only SB assist. Pt left in chair asx with family RN and MD present in room.   Leory Plowman  Mobility Specialist Please contact via [employer] office at [phone]

## 2023-03-29 NOTE — TOC Progression Note (Signed)
Transition of Care (TOC) - Progression Note    Patient Details  Name: Mark Ali. MRN: [id number] Date of Birth: 08/03/1950  Transition of Care [facility]) CM/SW Contact  Marliss Coots, LCSW Phone Number: 03/29/2023, 2:43 PM  Clinical Narrative:     2:44 PM CSW returned to patient's bedside to provide patient and spouse with current SNF options and Medicare ratings ([facility], [address], [address], [place], [place], [address], [place], [place]).  Expected Discharge Plan: Skilled Nursing Facility Barriers to Discharge: Continued Medical Work up, SNF Pending bed offer  Expected Discharge Plan and Services In-house Referral: Clinical Social Work Discharge Planning Services: CM Consult Post Acute Care Choice: Skilled Nursing Facility Living arrangements for the past 2 months: Single Family Home                                       Social Determinants of Health (SDOH) Interventions SDOH Screenings   Food Insecurity: No Food Insecurity (03/27/2023)  Housing: Unknown (03/27/2023)  Transportation Needs: No Transportation Needs (03/27/2023)  Utilities: Not At Risk (03/27/2023)  Depression (PHQ2-9): Low Risk  (01/22/2023)  Financial Resource Strain: Low Risk  (01/22/2023)  Physical Activity: Sufficiently Active (01/22/2023)  Social Connections: Socially Integrated (03/27/2023)  Stress: No Stress Concern Present (01/22/2023)  Tobacco Use: Low Risk  (03/26/2023)  Health Literacy: Adequate Health Literacy (01/22/2023)    Readmission Risk Interventions     No data to display

## 2023-03-29 NOTE — Progress Notes (Signed)
Initial Nutrition Assessment  DOCUMENTATION CODES:   Not applicable  INTERVENTION:  Boost plus BID Multivitamin with minerals My plate diabetes education provided, with handout, Diabetes nutrition video at discharge.    NUTRITION DIAGNOSIS:   Increased nutrient needs related to acute illness as evidenced by estimated needs.    GOAL:   Patient will meet greater than or equal to 90% of their needs    MONITOR:   PO intake  REASON FOR ASSESSMENT:   Consult Assessment of nutrition requirement/status, Diet education  ASSESSMENT:  73 y.o. M, presented to ED from home with complaints of, nausea, vomiting, diarrhea, generalized weakness and intermittent abdominal pain which has been worsening over the last several days. He had fallen earlier in the day with wife calling EMS due to unable to get self up. PMH: DMT@, HTN, HLD. Patient setting up in chair with family in room. Stated declined oral intake.  Wife reports they have been brining meals in. Patient reports, will usually eat two full meals a day, breakfast and dinner. Breakfast consists of EGGs, Bacon/sausage/liver pork, grits. This usually around 9:30, He will sometimes wake up early to use bathroom and may grab a glass of juice and go back to bed for a few more hours. Dinners;  flounder(fried), chicken, meatloaf, some type of potato. Enjoys [employer] chicken pot pie.  He snacks between breakfast and dinner and also after dinner.  Stays up late.  Discussed carbohydrates providing My plate method and carb counting information. No questions asked.    Admit weight: 95.5 kg Weight history:     Average Meal Intake: No documentation.   Nutritionally Relevant Medications: Scheduled Meds:  gabapentin  200 mg Oral QHS   metoprolol succinate  100 mg Oral Daily   simvastatin  10 mg Oral q1800    Labs Reviewed:  CBG ranges from 180-320 mg/dL over the last 24 hours HgbA1c 10.3 03/27/23    NUTRITION - FOCUSED PHYSICAL  EXAM:  Flowsheet Row Most Recent Value  Orbital Region Mild depletion  Upper Arm Region No depletion  Thoracic and Lumbar Region No depletion  Buccal Region No depletion  Temple Region Mild depletion  Clavicle Bone Region Mild depletion  Clavicle and Acromion Bone Region Mild depletion  Scapular Bone Region No depletion  Dorsal Hand No depletion  Patellar Region No depletion  Anterior Thigh Region No depletion  Posterior Calf Region No depletion  Edema (RD Assessment) None  Hair Reviewed  Eyes Reviewed  Mouth Reviewed  Skin Reviewed  Nails Reviewed       Diet Order:   Diet Order             Diet Carb Modified Fluid consistency: Thin; Room service appropriate? Yes  Diet effective now                   EDUCATION NEEDS:   Education needs have been addressed  Skin:  Skin Assessment: Reviewed RN Assessment  Last BM:  03/28/23  Height:   Ht Readings from Last 1 Encounters:  03/26/23 6' 5.5" (1.969 m)    Weight:   Wt Readings from Last 1 Encounters:  03/26/23 95.5 kg    Ideal Body Weight:     BMI:  Body mass index is 24.63 kg/m.  Estimated Nutritional Needs:   Kcal:  2200-2500 kcal  Protein:  125- 155 g  Fluid:  18ml/kcal    Jamelle Haring RDN, LDN Clinical Dietitian   If unable to reach, please contact "RD Inpatient" secure chat  group between 8 am-4 pm daily"

## 2023-03-29 NOTE — Plan of Care (Signed)

## 2023-03-29 NOTE — Progress Notes (Signed)
  Progress Note   Patient: Mark Ali. WUJ:[id number] DOB: 24-Feb-1951 DOA: 03/26/2023     3 DOS: the patient was seen and examined on 03/29/2023   Brief hospital course: 73 year old gentleman with history of type 2 diabetes on metformin and Jardiance, hypertension and hyperlipidemia presented to the emergency department with nausea vomiting and diarrhea, extreme generalized weakness and fall at home.  He has been having intermittent abdominal pain for the last several days.  He also suffered recent viral-like illnesses.  He fell in the bathroom yesterday morning.  EMS was called.  Patient was brought to the ER.  He was found with blood glucose of 567, respiratory virus panel negative.  Potassium 6.7, bicarb 20, creatinine 2.9 with a baseline creatinine about 1.6.  Beta-hydroxybutyrate 4.26.  Given IV fluids, started on insulin infusion and admitted to the hospital. 1/22, converted to subcu insulin.  Blood sugars are still elevated.  Assessment and Plan: 1.DKA w/ known DM2  - Novolog SS - Semglee 40 units sq daily  - Gabapentin 200 mg PO at bedtime  - Zocor 10 mg PO daily   2.AKI on CKD 3b - IV LR 100 cc/hr   3. HyperK+ - Resolved  4. HTN - Toprol XL 100 mg PO daily   5. Thrush - Nystatin 500,000 units PO 4 times daily    Subjective: Pt seen and examined at the bedside. His BUN/Cr are improving with IV fluids but he still has some intermittent low BP readings throughout the day (especially when moving from the bed to the chair). Nystatin ordered today as he has thrush. Continue with IV fluids and monitor kidney function carefully.   Physical Exam: Vitals:   03/28/23 2145 03/29/23 0553 03/29/23 0726 03/29/23 1200  BP: (!) 152/82 134/78 129/76 (!) 88/67  Pulse: (!) 107 98 93 97  Resp: 20 18 20    Temp: 100.3 F (37.9 C) 98.7 F (37.1 C) 98 F (36.7 C)   TempSrc: Oral Oral Oral   SpO2: 92% 93% 95%   Weight:      Height:       Physical Exam HENT:     Head:  Normocephalic.     Mouth/Throat:     Comments: Thrush inside mouth Cardiovascular:     Rate and Rhythm: Normal rate and regular rhythm.  Pulmonary:     Effort: Pulmonary effort is normal.  Abdominal:     Palpations: Abdomen is soft.  Musculoskeletal:        General: Normal range of motion.     Cervical back: Neck supple.  Skin:    General: Skin is warm.  Neurological:     Mental Status: He is alert. Mental status is at baseline.  Psychiatric:        Mood and Affect: Mood normal.       Disposition: Status is: Inpatient Remains inpatient appropriate because: IV fluids and serial labs   Planned Discharge Destination: Home    Time spent: 35 minutes  Author: Baron Hamper , MD 03/29/2023 12:39 PM  For on call review [email or web address].

## 2023-03-30 DIAGNOSIS — E1110 Type 2 diabetes mellitus with ketoacidosis without coma: Secondary | ICD-10-CM | POA: Diagnosis not present

## 2023-03-30 DIAGNOSIS — N1832 Chronic kidney disease, stage 3b: Secondary | ICD-10-CM | POA: Insufficient documentation

## 2023-03-30 DIAGNOSIS — E875 Hyperkalemia: Secondary | ICD-10-CM

## 2023-03-30 LAB — BASIC METABOLIC PANEL
Anion gap: 9 (ref 5–15)
BUN: 25 mg/dL — ABNORMAL HIGH (ref 8–23)
CO2: 27 mmol/L (ref 22–32)
Calcium: 8.5 mg/dL — ABNORMAL LOW (ref 8.9–10.3)
Chloride: 104 mmol/L (ref 98–111)
Creatinine, Ser: 1.26 mg/dL — ABNORMAL HIGH (ref 0.61–1.24)
GFR, Estimated: 60 mL/min — ABNORMAL LOW (ref >60)
Glucose, Bld: 124 mg/dL — ABNORMAL HIGH (ref 70–99)
Potassium: 3.6 mmol/L (ref 3.5–5.1)
Sodium: 140 mmol/L (ref 135–145)

## 2023-03-30 LAB — GLUCOSE, CAPILLARY
Glucose-Capillary: 109 mg/dL — ABNORMAL HIGH (ref 70–99)
Glucose-Capillary: 311 mg/dL — ABNORMAL HIGH (ref 70–99)
Glucose-Capillary: 128 mg/dL — ABNORMAL HIGH (ref 70–99)
Glucose-Capillary: 122 mg/dL — ABNORMAL HIGH (ref 70–99)

## 2023-03-30 NOTE — Assessment & Plan Note (Signed)
Patient's baseline creatinine is around 1.6 which makes his chronic kidney disease 3B On presentation his creatinine was bumped to 2.9 with hyperkalemia. Serum creatinine has improved and yesterday was 1.59 which is back to baseline Avoid nephrotoxic agents Trend

## 2023-03-30 NOTE — Progress Notes (Signed)
TRH night cross cover note:   I was notified by RN of the patient's most recent Na level of 149 on 1/24. Will recheck bmp at this time.      Newton Pigg, DO Hospitalist

## 2023-03-30 NOTE — Hospital Course (Signed)
73 year old male with PMH of T2DM on metformin and Jardiance, HTN, HLD presented to the ER with nausea vomiting and diarrhea and extreme weakness with a fall at home.  He been having intermittent abdominal pain for the last several days.  In the ED was found to have blood glucose of 567 potassium of 6.7, bicarb of 20, creatinine of 2.9 and beta hydroxybutyrate of 4.26.  Patient was given IV fluid resuscitation, insulin infusion and admitted to the hospital.  Patient was converted off insulin drip on 03/27/2023.

## 2023-03-30 NOTE — Assessment & Plan Note (Signed)
Hemoglobin of 8.7 Workup reveals elevated ferritin likely acute phase reactant, low iron, high normal B12. Begin iron supplementation

## 2023-03-30 NOTE — Assessment & Plan Note (Signed)
Continue statin.

## 2023-03-30 NOTE — Assessment & Plan Note (Addendum)
BP is well-controlled on Toprol, hydrochlorothiazide Holding losartan due to AKI

## 2023-03-30 NOTE — Progress Notes (Signed)
Progress Note   Patient: Mark Chill Sr. VHQ:[id number] DOB: 1950/05/22 DOA: 03/26/2023     4 DOS: the patient was seen and examined on 03/30/2023   Brief hospital course: 73 year old male with PMH of T2DM on metformin and Jardiance, HTN, HLD presented to the ER with nausea vomiting and diarrhea and extreme weakness with a fall at home.  He been having intermittent abdominal pain for the last several days.  In the ED was found to have blood glucose of 567 potassium of 6.7, bicarb of 20, creatinine of 2.9 and beta hydroxybutyrate of 4.26.  Patient was given IV fluid resuscitation, insulin infusion and admitted to the hospital.  Patient was converted off insulin drip on 03/27/2023.  Assessment and Plan: * DKA (diabetic ketoacidosis) (HCC) Resolved status post IV insulin gtt. and IV fluid resuscitation.  Acute renal failure superimposed on stage 3b chronic kidney disease (HCC) Patient's baseline creatinine is around 1.6 which makes his chronic kidney disease 3B On presentation his creatinine was bumped to 2.9 with hyperkalemia. Serum creatinine has improved and yesterday was 1.59 which is back to baseline Avoid nephrotoxic agents Trend     DKA, type 2, not at goal [facility]) Glycemic control is improving.  Patient is on Semglee 40 units nightly and sliding scale insulin Fasting sugar today was 109 Holding Jardiance given its propensity to cause DKA Holding metformin due to AKI  Hyperlipidemia associated with type 2 diabetes mellitus (HCC) Continue statin  Essential hypertension BP is well-controlled on Toprol, hydrochlorothiazide Holding losartan due to AKI  Anemia, chronic disease Hemoglobin of 8.7 Workup reveals elevated ferritin likely acute phase reactant, low iron, high normal B12. Begin iron supplementation    Subjective: Patient reports he is feeling better has gotten some of his strength back.  He denies specific pain.  Physical Exam: Vitals:   03/29/23 1528  03/29/23 2058 03/30/23 0525 03/30/23 0810  BP: 127/64 124/66 125/63 128/68  Pulse: 99 96 90   Resp: 20 18 18 20   Temp: 98.4 F (36.9 C) 98.9 F (37.2 C) 98.2 F (36.8 C) 98.3 F (36.8 C)  TempSrc: Oral Oral Oral Oral  SpO2: 98% 92% 92% 92%  Weight:      Height:       Physical Examination: General appearance - alert, well appearing, and in no distress Chest -normal effort Heart - normal rate and regular rhythm Abdomen - soft, nontender, nondistended, no masses or organomegaly Extremities - peripheral pulses normal, no pedal edema, no clubbing or cyanosis  Data Reviewed: Results for orders placed or performed during the hospital encounter of 03/26/23 (from the past 24 hours)  Glucose, capillary     Status: Abnormal   Collection Time: 03/29/23 11:40 AM  Result Value Ref Range   Glucose-Capillary 173 (H) 70 - 99 mg/dL  Glucose, capillary     Status: Abnormal   Collection Time: 03/29/23  3:25 PM  Result Value Ref Range   Glucose-Capillary 299 (H) 70 - 99 mg/dL  Ferritin     Status: Abnormal   Collection Time: 03/29/23  5:31 PM  Result Value Ref Range   Ferritin 3,303 (H) 24 - 336 ng/mL  Glucose, capillary     Status: Abnormal   Collection Time: 03/29/23  9:00 PM  Result Value Ref Range   Glucose-Capillary 116 (H) 70 - 99 mg/dL  Glucose, capillary     Status: Abnormal   Collection Time: 03/30/23  8:05 AM  Result Value Ref Range   Glucose-Capillary 109 (H) 70 -  99 mg/dL     Family Communication: Patient at bedside  Disposition: Status is: Inpatient Remains inpatient appropriate because: Need for IV fluids  Planned Discharge Destination: Skilled nursing facility DVT prophylaxis  Time spent: 37 minutes  Author: Reva Bores, MD 03/30/2023 11:34 AM  For on call review [email or web address].

## 2023-03-30 NOTE — Assessment & Plan Note (Signed)
Resolved status post IV insulin gtt. and IV fluid resuscitation.

## 2023-03-30 NOTE — Plan of Care (Signed)
Problem: Clinical Measurements: Goal: Ability to maintain clinical measurements within normal limits will improve Outcome: Progressing   Problem: Clinical Measurements: Goal: Will remain free from infection Outcome: Progressing

## 2023-03-30 NOTE — Assessment & Plan Note (Addendum)
Glycemic control is improving.  Patient is on Semglee 40 units nightly and sliding scale insulin Fasting sugar today was 109 Holding Jardiance given its propensity to cause DKA Holding metformin due to AKI

## 2023-03-31 DIAGNOSIS — E1110 Type 2 diabetes mellitus with ketoacidosis without coma: Secondary | ICD-10-CM | POA: Diagnosis not present

## 2023-03-31 LAB — GLUCOSE, CAPILLARY
Glucose-Capillary: 143 mg/dL — ABNORMAL HIGH (ref 70–99)
Glucose-Capillary: 318 mg/dL — ABNORMAL HIGH (ref 70–99)
Glucose-Capillary: 165 mg/dL — ABNORMAL HIGH (ref 70–99)

## 2023-03-31 MED ORDER — FERROUS SULFATE 325 (65 FE) MG PO TABS
325.0000 mg | ORAL_TABLET | ORAL | Status: DC
  Administered 2023-03-31 – 2023-04-02 (×2): 325 mg via ORAL
  Filled 2023-03-31 (×2): qty 1

## 2023-03-31 NOTE — Progress Notes (Signed)
Progress Note   Patient: Mark Mcminn Sr. VWU:[id number] DOB: June 07, 1950 DOA: 03/26/2023     5 DOS: the patient was seen and examined on 03/31/2023   Brief hospital course: 73 year old male with PMH of T2DM on metformin and Jardiance, HTN, HLD presented to the ER with nausea vomiting and diarrhea and extreme weakness with a fall at home.  He been having intermittent abdominal pain for the last several days.  In the ED was found to have blood glucose of 567 potassium of 6.7, bicarb of 20, creatinine of 2.9 and beta hydroxybutyrate of 4.26.  Patient was given IV fluid resuscitation, insulin infusion and admitted to the hospital.  Patient was converted off insulin drip on 03/27/2023.  Assessment and Plan: * DKA (diabetic ketoacidosis) (HCC) Resolved status post IV insulin gtt. and IV fluid resuscitation.  Acute renal failure superimposed on stage 3b chronic kidney disease (HCC) Patient's baseline creatinine is around 1.6 which makes his chronic kidney disease 3B On presentation his creatinine was bumped to 2.9 with hyperkalemia. Serum creatinine has improved and yesterday was 1.26 which is improved from baseline Avoid nephrotoxic agents Trend     DKA, type 2, not at goal [facility]) Glycemic control is improving.  Patient is on Semglee 40 units nightly and sliding scale insulin Fasting sugar today was 109 Holding Jardiance given its propensity to cause DKA Holding metformin due to AKI  Hyperlipidemia associated with type 2 diabetes mellitus (HCC) Continue statin  Essential hypertension BP is well-controlled on Toprol, hydrochlorothiazide Holding losartan due to AKI  Anemia, chronic disease Hemoglobin of 8.7 Workup reveals elevated ferritin likely acute phase reactant, low iron, high normal B12. Begin iron supplementation        Subjective: Feels well this morning.  Reports he is hungry.  Physical Exam: Vitals:   03/30/23 1716 03/30/23 2024 03/31/23 0543 03/31/23 0831   BP: 131/75 132/69 138/70 131/69  Pulse: 89 (!) 102 98 (!) 102  Resp:  16 16 16   Temp: 97.6 F (36.4 C) 99.2 F (37.3 C) 98.5 F (36.9 C) 98.9 F (37.2 C)  TempSrc: Oral Oral Oral Oral  SpO2: 93% 94% 93% 97%  Weight:      Height:       Physical Examination: General appearance - alert, well appearing, and in no distress Chest - clear to auscultation, no wheezes, rales or rhonchi, symmetric air entry Heart - normal rate, regular rhythm, normal S1, S2, no murmurs, rubs, clicks or gallops Abdomen - soft, nontender, nondistended, no masses or organomegaly  Data Reviewed: Results for orders placed or performed during the hospital encounter of 03/26/23 (from the past 24 hours)  Glucose, capillary     Status: Abnormal   Collection Time: 03/30/23 12:28 PM  Result Value Ref Range   Glucose-Capillary 311 (H) 70 - 99 mg/dL  Glucose, capillary     Status: Abnormal   Collection Time: 03/30/23  5:18 PM  Result Value Ref Range   Glucose-Capillary 128 (H) 70 - 99 mg/dL  Glucose, capillary     Status: Abnormal   Collection Time: 03/30/23  8:35 PM  Result Value Ref Range   Glucose-Capillary 122 (H) 70 - 99 mg/dL  Basic metabolic panel     Status: Abnormal   Collection Time: 03/30/23  9:10 PM  Result Value Ref Range   Sodium 140 135 - 145 mmol/L   Potassium 3.6 3.5 - 5.1 mmol/L   Chloride 104 98 - 111 mmol/L   CO2 27 22 - 32 mmol/L  Glucose, Bld 124 (H) 70 - 99 mg/dL   BUN 25 (H) 8 - 23 mg/dL   Creatinine, Ser 1.61 (H) 0.61 - 1.24 mg/dL   Calcium 8.5 (L) 8.9 - 10.3 mg/dL   GFR, Estimated 60 (L) >60 mL/min   Anion gap 9 5 - 15  Glucose, capillary     Status: Abnormal   Collection Time: 03/31/23  8:37 AM  Result Value Ref Range   Glucose-Capillary 143 (H) 70 - 99 mg/dL     Family Communication: Patient at bedside  Disposition: Status is: Inpatient Is awaiting SNF placement PT to see again this afternoon and try to ambulate him and see his improvement in labs have improved his  overall function he might be a candidate for home while were waiting  Planned Discharge Destination: Skilled nursing facility DVT prophylaxis: SCDs Time spent: 38 minutes  Author: Reva Bores, MD 03/31/2023 10:29 AM  For on call review [email or web address].

## 2023-04-01 DIAGNOSIS — E785 Hyperlipidemia, unspecified: Secondary | ICD-10-CM

## 2023-04-01 DIAGNOSIS — E1110 Type 2 diabetes mellitus with ketoacidosis without coma: Secondary | ICD-10-CM | POA: Diagnosis not present

## 2023-04-01 DIAGNOSIS — I10 Essential (primary) hypertension: Secondary | ICD-10-CM

## 2023-04-01 DIAGNOSIS — N179 Acute kidney failure, unspecified: Secondary | ICD-10-CM | POA: Diagnosis not present

## 2023-04-01 DIAGNOSIS — N1832 Chronic kidney disease, stage 3b: Secondary | ICD-10-CM

## 2023-04-01 DIAGNOSIS — E1169 Type 2 diabetes mellitus with other specified complication: Secondary | ICD-10-CM | POA: Diagnosis not present

## 2023-04-01 LAB — GLUCOSE, CAPILLARY
Glucose-Capillary: 234 mg/dL — ABNORMAL HIGH (ref 70–99)
Glucose-Capillary: 281 mg/dL — ABNORMAL HIGH (ref 70–99)
Glucose-Capillary: 140 mg/dL — ABNORMAL HIGH (ref 70–99)
Glucose-Capillary: 124 mg/dL — ABNORMAL HIGH (ref 70–99)

## 2023-04-01 LAB — CBC
HCT: 23.4 % — ABNORMAL LOW (ref 39.0–52.0)
Hemoglobin: 7.5 g/dL — ABNORMAL LOW (ref 13.0–17.0)
MCH: 30.7 pg (ref 26.0–34.0)
MCHC: 32.1 g/dL (ref 30.0–36.0)
MCV: 95.9 fL (ref 80.0–100.0)
Platelets: 308 10*3/uL (ref 150–400)
RBC: 2.44 MIL/uL — ABNORMAL LOW (ref 4.22–5.81)
RDW: 14.6 % (ref 11.5–15.5)
WBC: 11.6 10*3/uL — ABNORMAL HIGH (ref 4.0–10.5)
nRBC: 0 % (ref 0.0–0.2)

## 2023-04-01 LAB — IRON AND TIBC
Iron: 12 ug/dL — ABNORMAL LOW (ref 45–182)
Saturation Ratios: 7 % — ABNORMAL LOW (ref 17.9–39.5)
TIBC: 176 ug/dL — ABNORMAL LOW (ref 250–450)
UIBC: 164 ug/dL

## 2023-04-01 LAB — PREPARE RBC (CROSSMATCH)

## 2023-04-01 LAB — RETICULOCYTES
Immature Retic Fract: 31.7 % — ABNORMAL HIGH (ref 2.3–15.9)
RBC.: 2.73 MIL/uL — ABNORMAL LOW (ref 4.22–5.81)
Retic Count, Absolute: 37.1 10*3/uL (ref 19.0–186.0)
Retic Ct Pct: 1.4 % (ref 0.4–3.1)

## 2023-04-01 LAB — MAGNESIUM: Magnesium: 1.6 mg/dL — ABNORMAL LOW (ref 1.7–2.4)

## 2023-04-01 LAB — RENAL FUNCTION PANEL
Albumin: <1.5 g/dL — ABNORMAL LOW (ref 3.5–5.0)
Anion gap: 9 (ref 5–15)
BUN: 16 mg/dL (ref 8–23)
CO2: 26 mmol/L (ref 22–32)
Calcium: 8 mg/dL — ABNORMAL LOW (ref 8.9–10.3)
Chloride: 101 mmol/L (ref 98–111)
Creatinine, Ser: 1.3 mg/dL — ABNORMAL HIGH (ref 0.61–1.24)
GFR, Estimated: 58 mL/min — ABNORMAL LOW (ref >60)
Glucose, Bld: 243 mg/dL — ABNORMAL HIGH (ref 70–99)
Phosphorus: 2 mg/dL — ABNORMAL LOW (ref 2.5–4.6)
Potassium: 3.6 mmol/L (ref 3.5–5.1)
Sodium: 136 mmol/L (ref 135–145)

## 2023-04-01 LAB — FERRITIN: Ferritin: 1038 ng/mL — ABNORMAL HIGH (ref 24–336)

## 2023-04-01 LAB — FOLATE: Folate: 14.2 ng/mL (ref >5.9)

## 2023-04-01 LAB — ABO/RH: ABO/RH(D): O POS

## 2023-04-01 LAB — VITAMIN B12: Vitamin B-12: 2334 pg/mL — ABNORMAL HIGH (ref 180–914)

## 2023-04-01 MED ORDER — INSULIN ASPART 100 UNIT/ML IJ SOLN
4.0000 [IU] | Freq: Three times a day (TID) | INTRAMUSCULAR | Status: DC
  Administered 2023-04-01 – 2023-04-02 (×3): 4 [IU] via SUBCUTANEOUS

## 2023-04-01 MED ORDER — MAGNESIUM SULFATE 2 GM/50ML IV SOLN
2.0000 g | Freq: Once | INTRAVENOUS | Status: AC
  Administered 2023-04-01: 2 g via INTRAVENOUS
  Filled 2023-04-01: qty 50

## 2023-04-01 MED ORDER — SODIUM CHLORIDE 0.9% IV SOLUTION: Freq: Once | INTRAVENOUS | Status: AC

## 2023-04-01 NOTE — Progress Notes (Signed)
Mobility Specialist Progress Note:   04/01/23 1451  Mobility  Activity Transferred from bed to chair  Level of Assistance Standby assist, set-up cues, supervision of patient - no hands on  Assistive Device Front wheel walker  Distance Ambulated (ft) 2 ft  Activity Response Tolerated well  Mobility Referral Yes  Mobility visit 1 Mobility  Mobility Specialist Start Time (ACUTE ONLY) 1343  Mobility Specialist Stop Time (ACUTE ONLY) 1354  Mobility Specialist Time Calculation (min) (ACUTE ONLY) 11 min   Pt received in bed, agreeable to mobility. Unable to perform orthostatic vitals d/t pt bloom transfusion. Pt agreeable to transfer to chair. No complaints of dizziness stated during ambulation. Pt left in chair with call bell in reach and all needs met. Wife present.   Leory Plowman  Mobility Specialist Please contact via [employer] office at [phone]

## 2023-04-01 NOTE — Plan of Care (Signed)
Problem: Education: Goal: Knowledge of General Education information will improve Description: Including pain rating scale, medication(s)/side effects and non-pharmacologic comfort measures Outcome: Not Progressing

## 2023-04-01 NOTE — TOC Progression Note (Addendum)
Transition of Care (TOC) - Progression Note    Patient Details  Name: Mark Reason Sr. MRN: [id number] Date of Birth: 04-Oct-1950  Transition of Care [facility]) CM/SW Contact  Marliss Coots, LCSW Phone Number: 04/01/2023, 12:55 PM  Clinical Narrative:     12:55 PM CSW returned to patient's bedside to inquire about SNF decision. Patient's spouse, Mark Ali, was also present at bedside. Patient and Mark Ali informed CSW of decision to discharge to [facility]. CSW informed SNF admissions of bed acceptance.   1:18 PM CSW submitted insurance authorization request for SNF. Authorization has been approved (ID [id number]) and is valid 04/02/23-04/04/23.   Expected Discharge Plan: Skilled Nursing Facility Barriers to Discharge: Continued Medical Work up, SNF Pending bed offer  Expected Discharge Plan and Services In-house Referral: Clinical Social Work Discharge Planning Services: CM Consult Post Acute Care Choice: Skilled Nursing Facility Living arrangements for the past 2 months: Single Family Home                                       Social Determinants of Health (SDOH) Interventions SDOH Screenings   Food Insecurity: No Food Insecurity (03/27/2023)  Housing: Unknown (03/27/2023)  Transportation Needs: No Transportation Needs (03/27/2023)  Utilities: Not At Risk (03/27/2023)  Depression (PHQ2-9): Low Risk  (01/22/2023)  Financial Resource Strain: Low Risk  (01/22/2023)  Physical Activity: Sufficiently Active (01/22/2023)  Social Connections: Socially Integrated (03/27/2023)  Stress: No Stress Concern Present (01/22/2023)  Tobacco Use: Low Risk  (03/26/2023)  Health Literacy: Adequate Health Literacy (01/22/2023)    Readmission Risk Interventions     No data to display

## 2023-04-01 NOTE — Progress Notes (Signed)
Physical Therapy Treatment Patient Details Name: Mark Deller Sr. MRN: [id number] DOB: 07-21-1950 Today's Date: 04/01/2023   History of Present Illness 73 y.o. male admitted 03/26/23 with generalized weakness, N/V, fall. Workup for DKA, AKI. PMH includes HTN, DM2 HLD.    PT Comments  Continuing work on functional mobility and activity tolerance;  session focused on more upright activity and ambulation with close watch of/for any presyncopal symptoms; Notably some dizziness as we approached the bed; Able to remain standing for standing BP after the walk (see below); Continues to benefit from using RW for steadiness with short distance ambulation; Improvements noted, but he is still at a decr functional level (using rW for short distance amb, dizziness with standing) compared to his functional baseline of being completely independent;   Also noteworthy that Mark Ali is more engaged in his medical status and care; Overall progressing well; Anticipate continuing good progress at post-acute rehabilitation.     04/01/23 1053 04/01/23 1057  Orthostatic Sitting  BP- Sitting  --  123/67 (MAP 83; taken seated after some standing activity)  Pulse- Sitting  --  107  Orthostatic Standing at 0 minutes  BP- Standing at 0 minutes 122/49 (MAP 71)  --   Pulse- Standing at 0 minutes 116  --        If plan is discharge home, recommend the following: A lot of help with walking and/or transfers;A lot of help with bathing/dressing/bathroom;Assistance with cooking/housework;Assist for transportation;Help with stairs or ramp for entrance   Can travel by private vehicle     Yes  Equipment Recommendations  Rolling walker (2 wheels);BSC/3in1    Recommendations for Other Services       Precautions / Restrictions Precautions Precautions: Fall     Mobility  Bed Mobility                    Transfers Overall transfer level: Needs assistance Equipment used: Rolling walker (2  wheels) Transfers: Sit to/from Stand Sit to Stand: Min assist           General transfer comment: Cues for hand placement and safety; decr control of descent to sit    Ambulation/Gait Ambulation/Gait assistance: Contact guard assist, Min assist Gait Distance (Feet): 12 Feet Assistive device: Rolling walker (2 wheels) Gait Pattern/deviations: Decreased step length - right, Decreased step length - left, Trunk flexed       General Gait Details: CGA for safety adn min assist for RW management , especially with obstacle in room; mild dizziness started towards end of walk   Stairs             Wheelchair Mobility     Tilt Bed    Modified Rankin (Stroke Patients Only)       Balance     Sitting balance-Leahy Scale: Fair       Standing balance-Leahy Scale: Poor                              Cognition Arousal: Alert Behavior During Therapy: WFL for tasks assessed/performed Overall Cognitive Status: Within Functional Limits for tasks assessed (for simple mobility tasks)                                 General Comments: More engaged in conversation related to his health and goals        Exercises  General Comments General comments (skin integrity, edema, etc.): Wife present and helpful; Briefly discussed DC plan and both pt and wife are on board for some short-term rehab to get stronger and move better before getting home; Pt more engaged in medical status, and showed much improved self-monitor for activity tolerance, including sharing with caregivers how he is feeling; he is hopeful that he will feel better after blood trnasfusion      Pertinent Vitals/Pain Pain Assessment Pain Assessment: No/denies pain    Home Living                          Prior Function            PT Goals (current goals can now be found in the care plan section) Acute Rehab PT Goals Patient Stated Goal: Wants to be stronger to get home;  agrees to post-acute rehab PT Goal Formulation: With patient Time For Goal Achievement: 04/11/23 Potential to Achieve Goals: Good Progress towards PT goals: Progressing toward goals    Frequency    Min 1X/week      PT Plan      Co-evaluation              AM-PAC PT "6 Clicks" Mobility   Outcome Measure  Help needed turning from your back to your side while in a flat bed without using bedrails?: A Little Help needed moving from lying on your back to sitting on the side of a flat bed without using bedrails?: A Little Help needed moving to and from a bed to a chair (including a wheelchair)?: A Little Help needed standing up from a chair using your arms (e.g., wheelchair or bedside chair)?: A Little Help needed to walk in hospital room?: A Lot Help needed climbing 3-5 steps with a railing? : Total 6 Click Score: 15    End of Session Equipment Utilized During Treatment: Gait belt Activity Tolerance: Patient tolerated treatment well;Other (comment) (despite fatigue) Patient left: in bed;with call bell/phone within reach;with bed alarm set;with family/visitor present (sitting EOB with plans to wash up) Nurse Communication: Mobility status PT Visit Diagnosis: Other abnormalities of gait and mobility (R26.89);Muscle weakness (generalized) (M62.81)     Time: 6578-4696 PT Time Calculation (min) (ACUTE ONLY): 12 min  Charges:    $Gait Training: 8-22 mins PT General Charges $$ ACUTE PT VISIT: 1 Visit                     Mark Ali, PT  Acute Rehabilitation Services Office [phone] Secure Chat welcomed    Mark Ali 04/01/2023, 12:56 PM

## 2023-04-01 NOTE — Progress Notes (Signed)
Mobility Specialist Progress Note:   04/01/23 1634  Mobility  Activity Ambulated with assistance in hallway  Level of Assistance Contact guard assist, steadying assist  Assistive Device Front wheel walker  Distance Ambulated (ft) 75 ft  Activity Response Tolerated well  Mobility Referral Yes  Mobility visit 1 Mobility  Mobility Specialist Start Time (ACUTE ONLY) 1615  Mobility Specialist Stop Time (ACUTE ONLY) 1630  Mobility Specialist Time Calculation (min) (ACUTE ONLY) 15 min   Pt received in chair, agreeable to mobility. BP taken by RN prior to session. Pt was able to ambulate in hallway with RW and CG for safety. 1x slight LOB d/t mild dizziness but pt was able to self correct with CG assist. Pt denied any other complaints. Pt returned to chair asymptomatic with call bell in reach and all needs met.   Leory Plowman  Mobility Specialist Please contact via [employer] office at [phone]

## 2023-04-01 NOTE — Progress Notes (Signed)
Occupational Therapy Treatment Patient Details Name: Mark Gordillo Sr. MRN: [id number] DOB: 1950-04-11 Today's Date: 04/01/2023   History of present illness 73 y.o. male admitted 03/26/23 with generalized weakness, N/V, fall. Workup for DKA, AKI. PMH includes HTN, DM2 HLD.   OT comments  Pt pleasant on arrival but deferred transfers this session as he has been up with PT and with mobility specialist. Focus session on seated grooming tasks and education regarding energy conservation based on pt report of how things have been going acutely. Pt pleasant and conversational throughout, but benefits from cues regarding how these apply in home setting. Pt able to identify 2 signs/symptoms that are often related to low BP this session but seems to have decreased awareness of how to determine when it is safe to continue mobilizing vs when seated rest is needed. Will continue to follow.       If plan is discharge home, recommend the following:  A lot of help with walking and/or transfers;A lot of help with bathing/dressing/bathroom;Assistance with cooking/housework;Assistance with feeding;Direct supervision/assist for medications management;Direct supervision/assist for financial management;Assist for transportation;Help with stairs or ramp for entrance   Equipment Recommendations  Other (comment) (pending acute progress)    Recommendations for Other Services      Precautions / Restrictions Precautions Precautions: Fall Restrictions Weight Bearing Restrictions Per Provider Order: No       Mobility Bed Mobility                    Transfers                         Balance                                           ADL either performed or assessed with clinical judgement   ADL Overall ADL's : Needs assistance/impaired     Grooming: Set up;Sitting Grooming Details (indicate cue type and reason): pt deferred walking to sink                                General ADL Comments: focus session on seated ADL as pt recently up with MS and pt deferred mobilizing to sink. Introduced Teaching laboratory technician based on conversation with pt and reviewed 4 Ps of energy conservation. Pt able to identify 2 signs/symptoms of low BP as well    Extremity/Trunk Assessment              Vision       Perception     Praxis      Cognition Arousal: Alert Behavior During Therapy: WFL for tasks assessed/performed Overall Cognitive Status: Within Functional Limits for tasks assessed (for simple ADL)                                 General Comments: More engaged in conversation related to his health and goals        Exercises      Shoulder Instructions       General Comments      Pertinent Vitals/ Pain       Pain Assessment Pain Assessment: No/denies pain  Home Living  Prior Functioning/Environment              Frequency  Min 1X/week        Progress Toward Goals  OT Goals(current goals can now be found in the care plan section)  Progress towards OT goals: Progressing toward goals  Acute Rehab OT Goals Patient Stated Goal: feel better OT Goal Formulation: With patient Time For Goal Achievement: 04/11/23 Potential to Achieve Goals: Good ADL Goals Pt Will Perform Lower Body Dressing: Independently;sit to/from stand Pt Will Transfer to Toilet: Independently;ambulating;regular height toilet Additional ADL Goal #1: Pt will demonstrate improved activity tolerance as evidenced by completing 10 minutes of OOB functional activity. Additional ADL Goal #2: Pt will demonstrate increased safety awareness by identifying signs of decrease in BP independently when engaging in functional task  Plan      Co-evaluation                 AM-PAC OT "6 Clicks" Daily Activity     Outcome Measure   Help from another person eating meals?:  None Help from another person taking care of personal grooming?: A Little Help from another person toileting, which includes using toliet, bedpan, or urinal?: A Lot Help from another person bathing (including washing, rinsing, drying)?: A Lot Help from another person to put on and taking off regular upper body clothing?: A Little Help from another person to put on and taking off regular lower body clothing?: A Lot 6 Click Score: 16    End of Session    OT Visit Diagnosis: Unsteadiness on feet (R26.81);Other abnormalities of gait and mobility (R26.89);Muscle weakness (generalized) (M62.81)   Activity Tolerance Patient limited by fatigue   Patient Left in chair;with call bell/phone within reach   Nurse Communication Mobility status        Time: 1610-9604 OT Time Calculation (min): 19 min  Charges: OT General Charges $OT Visit: 1 Visit OT Treatments $Self Care/Home Management : 8-22 mins  Tyler Deis, OTR/L [facility] Acute Rehabilitation Office: [phone]   Myrla Halsted 04/01/2023, 5:19 PM

## 2023-04-01 NOTE — Progress Notes (Addendum)
PROGRESS NOTE  Mark Sofia Sr. ZOX:[id number] DOB: 08-Apr-1950   PCP: Shelva Majestic, MD  Patient is from: Home.  DOA: 03/26/2023 LOS: 6  Chief complaints Chief Complaint  Patient presents with   Weakness   Vomiting   Hyperglycemia     Brief Narrative / Interim history: 73 year old male with PMH of T2DM on metformin and Jardiance, HTN, HLD presented to the ER with nausea vomiting and diarrhea and extreme weakness with a fall at home.  He been having intermittent abdominal pain for the last several days.  In the ED was found to have blood glucose of 567 potassium of 6.7, bicarb of 20, creatinine of 2.9 and beta hydroxybutyrate of 4.26.  Patient was given IV fluid resuscitation, insulin infusion and admitted to the hospital for DKA, AKI and hyperkalemia.  Patient's DKA, AKI, hypokalemia and hyponatremia resolved.  He was transitioned to subcu insulin.  Diabetic coordinator following.  Patient has symptomatic anemia with hemoglobin dropping from 13-7.5 without overt bleeding.  Last known hemoglobin was 12.5 in 02/2022.  1 unit of PRBC ordered on 1/27.  Subjective: Seen and examined earlier this morning.  No major events overnight of this morning.  Has no complaints other than weakness, some ankle swelling and poor appetite.  Patient's wife at bedside.  Objective: Vitals:   04/01/23 0631 04/01/23 0808 04/01/23 0851 04/01/23 1143  BP:  (!) 140/76 120/68 (!) (P) 122/58  Pulse:  100 100 (P) 90  Resp:  16 18 (P) 19  Temp:   98.2 F (36.8 C) (P) 98 F (36.7 C)  TempSrc:   Oral   SpO2: 94% 93% 93%   Weight:      Height:        Examination:  GENERAL: No apparent distress.  Nontoxic. HEENT: MMM.  Vision and hearing grossly intact.  NECK: Supple.  No apparent JVD.  RESP:  No IWOB.  Fair aeration bilaterally. CVS:  RRR. Heart sounds normal.  ABD/GI/GU: BS+. Abd soft, NTND.  MSK/EXT:  Moves extremities.  Significant muscle mass and subcu fat loss.  Bilateral ankle  edema. SKIN: no apparent skin lesion or wound NEURO: Awake, alert and oriented appropriately.  No apparent focal neuro deficit. PSYCH: Calm. Normal affect.   Procedures:  None  Microbiology summarized: COVID-19, influenza and RSV PCR nonreactive  Assessment and plan: Uncontrolled NIDDM-2 with diabetic ketoacidosis: Patient is only on Jardiance and metformin at home.  A1c 10.3%.  DKA resolved. Recent Labs  Lab 03/31/23 0837 03/31/23 1145 03/31/23 2118 04/01/23 0810 04/01/23 1154  GLUCAP 143* 318* 165* 234* 281*  -Continue Semglee 40 units daily -Continue SSI-moderate -Add NovoLog 4 units 3 times daily with meals -Check lipid panel.  For now continue Zocor 10 mg daily.  AKI on CKD-3B: Cr 2.95 on admission.  Baseline Cr ~1.3-1.6.  AKI resolved. Recent Labs    03/26/23 1749 03/26/23 1954 03/27/23 0204 03/27/23 0541 03/28/23 0513 03/29/23 0533 03/30/23 2110 04/01/23 0743  BUN 98* 104* 106* 110*  109* 69* 43* 25* 16  CREATININE 2.95* 2.83* 2.52* 2.38*  2.34* 1.93* 1.59* 1.26* 1.30*  -Avoid nephrotoxic meds.  Would avoid HCTZ, Jardiance, metformin and Mobic moving forward. -Would benefit from outpatient referral to nephrology given associated anemia.   Symptomatic anemia of renal disease: Hgb dropped from 13-7.5.  No overt bleeding.  Unknown baseline.  Not on anticoagulation or antiplatelet.  He reports fatigue and bilateral ankle swelling. Recent Labs    03/26/23 1749 03/26/23 1809 03/28/23 0513 03/29/23 0533 04/01/23  0743  HGB 12.9* 14.3 8.8* 8.7* 7.5*  -Transfuse 1 unit.  Verbally consented. -Check anemia panel -Will give a dose of Aranesp if he has good iron store -Would benefit from outpatient nephrology follow-up.   Hyperlipidemia associated with type 2 diabetes mellitus (HCC) -Continue statin -Check lipid panel   Essential hypertension: Normotensive -Continue home Toprol-XL -Hold HCTZ.  Discontinue discharge.   Hyperkalemia/hyponatremia:  Resolved  Hypomagnesemia -Monitor replenish as appropriate  Increased nutrient needs Body mass index is 24.63 kg/m. Nutrition Problem: Increased nutrient needs Etiology: acute illness Signs/Symptoms: estimated needs Interventions: MVI, Boost Plus, Education   DVT prophylaxis:  Place and maintain sequential compression device Start: 03/31/23 1031 Place and maintain sequential compression device Start: 03/28/23 1830 SCDs Start: 03/26/23 2022  Code Status: Full code Family Communication: Updated patient's wife at bedside Level of care: Med-Surg Status is: Inpatient Remains inpatient appropriate because: Uncontrolled diabetes, AKI and symptomatic anemia   Final disposition: SNF Consultants:  None  55 minutes with more than 50% spent in reviewing records, counseling patient/family and coordinating care.   Sch Meds:  Scheduled Meds:  ferrous sulfate  325 mg Oral QODAY   gabapentin  200 mg Oral QHS   insulin aspart  0-15 Units Subcutaneous TID WC   insulin aspart  0-5 Units Subcutaneous QHS   insulin aspart  4 Units Subcutaneous TID WC   insulin glargine-yfgn  40 Units Subcutaneous Daily   lactose free nutrition  237 mL Oral BID BM   metoprolol succinate  100 mg Oral Daily   multivitamin with minerals  1 tablet Oral Daily   nystatin  5 mL Oral QID   simvastatin  10 mg Oral q1800   Continuous Infusions: PRN Meds:.acetaminophen **OR** acetaminophen, dextrose, guaiFENesin-dextromethorphan, ondansetron **OR** ondansetron (ZOFRAN) IV  Antimicrobials: Anti-infectives (From admission, onward)    None        I have personally reviewed the following labs and images: CBC: Recent Labs  Lab 03/26/23 1749 03/26/23 1809 03/28/23 0513 03/29/23 0533 04/01/23 0743  WBC 15.8*  --  13.2* 11.9* 11.6*  NEUTROABS  --   --  11.4* 9.9*  --   HGB 12.9* 14.3 8.8* 8.7* 7.5*  HCT 40.5 42.0 27.3* 27.4* 23.4*  MCV 96.2  --  94.5 94.8 95.9  PLT 350  --  294 320 308   BMP  &GFR Recent Labs  Lab 03/27/23 0541 03/28/23 0513 03/29/23 0533 03/30/23 2110 04/01/23 0743  NA 144  146* 139 149* 140 136  K 4.1  4.1 3.8 3.9 3.6 3.6  CL 106  107 104 111 104 101  CO2 26  26 24 28 27 26   GLUCOSE 247*  237* 320* 180* 124* 243*  BUN 110*  109* 69* 43* 25* 16  CREATININE 2.38*  2.34* 1.93* 1.59* 1.26* 1.30*  CALCIUM 8.8*  8.8* 8.2* 8.8* 8.5* 8.0*  MG  --  2.4  --   --  1.6*  PHOS  --  2.7  --   --  2.0*   Estimated Creatinine Clearance: 63.7 mL/min (A) (by C-G formula based on SCr of 1.3 mg/dL (H)). Liver & Pancreas: Recent Labs  Lab 03/26/23 1749 03/28/23 0513 04/01/23 0743  AST 57* 109*  --   ALT 39 44  --   ALKPHOS 106 77  --   BILITOT 1.4* 0.7  --   PROT 7.3 5.8*  --   ALBUMIN 2.1* 1.5* <1.5*   No results for input(s): "LIPASE", "AMYLASE" in the last 168 hours. No  results for input(s): "AMMONIA" in the last 168 hours. Diabetic: No results for input(s): "HGBA1C" in the last 72 hours. Recent Labs  Lab 03/31/23 0837 03/31/23 1145 03/31/23 2118 04/01/23 0810 04/01/23 1154  GLUCAP 143* 318* 165* 234* 281*   Cardiac Enzymes: No results for input(s): "CKTOTAL", "CKMB", "CKMBINDEX", "TROPONINI" in the last 168 hours. No results for input(s): "PROBNP" in the last 8760 hours. Coagulation Profile: No results for input(s): "INR", "PROTIME" in the last 168 hours. Thyroid Function Tests: No results for input(s): "TSH", "T4TOTAL", "FREET4", "T3FREE", "THYROIDAB" in the last 72 hours. Lipid Profile: No results for input(s): "CHOL", "HDL", "LDLCALC", "TRIG", "CHOLHDL", "LDLDIRECT" in the last 72 hours. Anemia Panel: Recent Labs    03/29/23 1731 04/01/23 0939  VITAMINB12  --  2,334*  FOLATE  --  14.2  FERRITIN 3,303*  --   RETICCTPCT  --  1.4   Urine analysis:    Component Value Date/Time   COLORURINE YELLOW 01/17/2021 1038   APPEARANCEUR CLEAR 01/17/2021 1038   LABSPEC 1.023 01/17/2021 1038   PHURINE 5.0 01/17/2021 1038   GLUCOSEU  >=500 (A) 01/17/2021 1038   HGBUR NEGATIVE 01/17/2021 1038   HGBUR negative 06/21/2009 1012   BILIRUBINUR NEGATIVE 01/17/2021 1038   BILIRUBINUR n 01/25/2016 1311   KETONESUR 5 (A) 01/17/2021 1038   PROTEINUR NEGATIVE 01/17/2021 1038   UROBILINOGEN 1.0 09/26/2019 1514   NITRITE NEGATIVE 01/17/2021 1038   LEUKOCYTESUR NEGATIVE 01/17/2021 1038   Sepsis Labs: Invalid input(s): "PROCALCITONIN", "LACTICIDVEN"  Microbiology: Recent Results (from the past 240 hours)  Resp panel by RT-PCR (RSV, Flu A&B, Covid) Anterior Nasal Swab     Status: None   Collection Time: 03/26/23  5:00 PM   Specimen: Anterior Nasal Swab  Result Value Ref Range Status   SARS Coronavirus 2 by RT PCR NEGATIVE NEGATIVE Final   Influenza A by PCR NEGATIVE NEGATIVE Final   Influenza B by PCR NEGATIVE NEGATIVE Final    Comment: (NOTE) The Xpert Xpress SARS-CoV-2/FLU/RSV plus assay is intended as an aid in the diagnosis of influenza from Nasopharyngeal swab specimens and should not be used as a sole basis for treatment. Nasal washings and aspirates are unacceptable for Xpert Xpress SARS-CoV-2/FLU/RSV testing.  Fact Sheet for Patients: BloggerCourse.com  Fact Sheet for Healthcare Providers: SeriousBroker.it  This test is not yet approved or cleared by the [place] FDA and has been authorized for detection and/or diagnosis of SARS-CoV-2 by FDA under an Emergency Use Authorization (EUA). This EUA will remain in effect (meaning this test can be used) for the duration of the COVID-19 declaration under Section 564(b)(1) of the Act, 21 U.S.C. section 360bbb-3(b)(1), unless the authorization is terminated or revoked.     Resp Syncytial Virus by PCR NEGATIVE NEGATIVE Final    Comment: (NOTE) Fact Sheet for Patients: BloggerCourse.com  Fact Sheet for Healthcare Providers: SeriousBroker.it  This test is not yet  approved or cleared by the [place] FDA and has been authorized for detection and/or diagnosis of SARS-CoV-2 by FDA under an Emergency Use Authorization (EUA). This EUA will remain in effect (meaning this test can be used) for the duration of the COVID-19 declaration under Section 564(b)(1) of the Act, 21 U.S.C. section 360bbb-3(b)(1), unless the authorization is terminated or revoked.  Performed at [facility], [address] [place], [place] [address]     Radiology Studies: No results found.    Rozina Pointer T. Alleah Dearman [facility]  If 7PM-7AM, please contact night-coverage [email or web address] 04/01/2023, 12:15 PM

## 2023-04-02 DIAGNOSIS — Z7984 Long term (current) use of oral hypoglycemic drugs: Secondary | ICD-10-CM | POA: Diagnosis not present

## 2023-04-02 DIAGNOSIS — D638 Anemia in other chronic diseases classified elsewhere: Secondary | ICD-10-CM

## 2023-04-02 DIAGNOSIS — N189 Chronic kidney disease, unspecified: Secondary | ICD-10-CM | POA: Diagnosis not present

## 2023-04-02 DIAGNOSIS — R0989 Other specified symptoms and signs involving the circulatory and respiratory systems: Secondary | ICD-10-CM | POA: Diagnosis not present

## 2023-04-02 DIAGNOSIS — M19022 Primary osteoarthritis, left elbow: Secondary | ICD-10-CM | POA: Diagnosis not present

## 2023-04-02 DIAGNOSIS — R2689 Other abnormalities of gait and mobility: Secondary | ICD-10-CM | POA: Diagnosis not present

## 2023-04-02 DIAGNOSIS — Z8639 Personal history of other endocrine, nutritional and metabolic disease: Secondary | ICD-10-CM | POA: Diagnosis not present

## 2023-04-02 DIAGNOSIS — J189 Pneumonia, unspecified organism: Secondary | ICD-10-CM | POA: Diagnosis not present

## 2023-04-02 DIAGNOSIS — I129 Hypertensive chronic kidney disease with stage 1 through stage 4 chronic kidney disease, or unspecified chronic kidney disease: Secondary | ICD-10-CM | POA: Diagnosis not present

## 2023-04-02 DIAGNOSIS — E785 Hyperlipidemia, unspecified: Secondary | ICD-10-CM | POA: Diagnosis not present

## 2023-04-02 DIAGNOSIS — N179 Acute kidney failure, unspecified: Secondary | ICD-10-CM | POA: Diagnosis not present

## 2023-04-02 DIAGNOSIS — I700 Atherosclerosis of aorta: Secondary | ICD-10-CM | POA: Diagnosis not present

## 2023-04-02 DIAGNOSIS — M6281 Muscle weakness (generalized): Secondary | ICD-10-CM | POA: Diagnosis not present

## 2023-04-02 DIAGNOSIS — E1110 Type 2 diabetes mellitus with ketoacidosis without coma: Secondary | ICD-10-CM | POA: Diagnosis not present

## 2023-04-02 DIAGNOSIS — Z794 Long term (current) use of insulin: Secondary | ICD-10-CM | POA: Diagnosis not present

## 2023-04-02 DIAGNOSIS — M5451 Vertebrogenic low back pain: Secondary | ICD-10-CM | POA: Diagnosis not present

## 2023-04-02 DIAGNOSIS — Z719 Counseling, unspecified: Secondary | ICD-10-CM | POA: Diagnosis not present

## 2023-04-02 DIAGNOSIS — M170 Bilateral primary osteoarthritis of knee: Secondary | ICD-10-CM | POA: Diagnosis not present

## 2023-04-02 DIAGNOSIS — E1122 Type 2 diabetes mellitus with diabetic chronic kidney disease: Secondary | ICD-10-CM | POA: Diagnosis not present

## 2023-04-02 DIAGNOSIS — N1832 Chronic kidney disease, stage 3b: Secondary | ICD-10-CM | POA: Diagnosis not present

## 2023-04-02 DIAGNOSIS — I10 Essential (primary) hypertension: Secondary | ICD-10-CM | POA: Diagnosis not present

## 2023-04-02 DIAGNOSIS — D631 Anemia in chronic kidney disease: Secondary | ICD-10-CM | POA: Diagnosis not present

## 2023-04-02 DIAGNOSIS — R059 Cough, unspecified: Secondary | ICD-10-CM | POA: Diagnosis not present

## 2023-04-02 DIAGNOSIS — R52 Pain, unspecified: Secondary | ICD-10-CM | POA: Diagnosis not present

## 2023-04-02 DIAGNOSIS — Z7189 Other specified counseling: Secondary | ICD-10-CM | POA: Diagnosis not present

## 2023-04-02 DIAGNOSIS — E1169 Type 2 diabetes mellitus with other specified complication: Secondary | ICD-10-CM | POA: Diagnosis not present

## 2023-04-02 LAB — TYPE AND SCREEN
ABO/RH(D): O POS
Antibody Screen: NEGATIVE
Unit division: 0

## 2023-04-02 LAB — LIPID PANEL
Cholesterol: 81 mg/dL (ref 0–200)
HDL: 19 mg/dL — ABNORMAL LOW (ref >40)
LDL Cholesterol: 46 mg/dL (ref 0–99)
Total CHOL/HDL Ratio: 4.3 {ratio}
Triglycerides: 81 mg/dL (ref <150)
VLDL: 16 mg/dL (ref 0–40)

## 2023-04-02 LAB — RENAL FUNCTION PANEL
Albumin: <1.5 g/dL — ABNORMAL LOW (ref 3.5–5.0)
Anion gap: 8 (ref 5–15)
BUN: 15 mg/dL (ref 8–23)
CO2: 27 mmol/L (ref 22–32)
Calcium: 8.1 mg/dL — ABNORMAL LOW (ref 8.9–10.3)
Chloride: 105 mmol/L (ref 98–111)
Creatinine, Ser: 1.3 mg/dL — ABNORMAL HIGH (ref 0.61–1.24)
GFR, Estimated: 58 mL/min — ABNORMAL LOW (ref >60)
Glucose, Bld: 88 mg/dL (ref 70–99)
Phosphorus: 2.4 mg/dL — ABNORMAL LOW (ref 2.5–4.6)
Potassium: 3.7 mmol/L (ref 3.5–5.1)
Sodium: 140 mmol/L (ref 135–145)

## 2023-04-02 LAB — BPAM RBC
Blood Product Expiration Date: 202502032359
ISSUE DATE / TIME: 202501271209
Unit Type and Rh: 5100

## 2023-04-02 LAB — GLUCOSE, CAPILLARY
Glucose-Capillary: 137 mg/dL — ABNORMAL HIGH (ref 70–99)
Glucose-Capillary: 78 mg/dL (ref 70–99)

## 2023-04-02 LAB — MAGNESIUM: Magnesium: 1.8 mg/dL (ref 1.7–2.4)

## 2023-04-02 LAB — CBC
HCT: 25.5 % — ABNORMAL LOW (ref 39.0–52.0)
Hemoglobin: 8.1 g/dL — ABNORMAL LOW (ref 13.0–17.0)
MCH: 30.5 pg (ref 26.0–34.0)
MCHC: 31.8 g/dL (ref 30.0–36.0)
MCV: 95.9 fL (ref 80.0–100.0)
Platelets: 313 10*3/uL (ref 150–400)
RBC: 2.66 MIL/uL — ABNORMAL LOW (ref 4.22–5.81)
RDW: 15.6 % — ABNORMAL HIGH (ref 11.5–15.5)
WBC: 11.6 10*3/uL — ABNORMAL HIGH (ref 4.0–10.5)
nRBC: 0.3 % — ABNORMAL HIGH (ref 0.0–0.2)

## 2023-04-02 MED ORDER — FERROUS SULFATE 325 (65 FE) MG PO TABS: 325.0000 mg | ORAL_TABLET | Freq: Every day | ORAL | Status: AC

## 2023-04-02 MED ORDER — ADULT MULTIVITAMIN W/MINERALS CH: 1.0000 | ORAL_TABLET | Freq: Every day | ORAL | Status: AC

## 2023-04-02 MED ORDER — GLUCERNA 1.0 CAL/CARBSTEADY PO LIQD: 237.0000 mL | Freq: Two times a day (BID) | ORAL | Status: AC

## 2023-04-02 MED ORDER — INSULIN ASPART 100 UNIT/ML IJ SOLN: 0.0000 [IU] | Freq: Three times a day (TID) | INTRAMUSCULAR | Status: DC

## 2023-04-02 MED ORDER — INSULIN GLARGINE-YFGN 100 UNIT/ML ~~LOC~~ SOLN: 45.0000 [IU] | Freq: Every day | SUBCUTANEOUS | Status: DC

## 2023-04-02 NOTE — TOC Transition Note (Signed)
Transition of Care [facility]) - Discharge Note   Patient Details  Name: Mark Kamel Sr. MRN: [id number] Date of Birth: 1950-05-23  Transition [facility]) CM/SW Contact:  Marliss Coots, LCSW Phone Number: 04/02/2023, 12:56 PM   Clinical Narrative:     Patient will DC to: [facility] Anticipated DC date: 04/02/2023 Family notified: Kyal Arts; Spouse; [phone] Transport by: Loretha Stapler; Spouse; [phone]   Per MD patient ready for DC to [facility]. RN to call report prior to discharge [phone]). RN, patient, patient's family, and facility notified of DC. Discharge Summary and FL2 sent to facility. DC packet on chart.  CSW will sign off for now as social work intervention is no longer needed. Please consult [place] again if new needs arise.   Final next level of care: Skilled Nursing Facility Barriers to Discharge: Barriers Resolved   Patient Goals and CMS Choice Patient states their goals for this hospitalization and ongoing recovery are:: SNF CMS Medicare.gov Compare Post Acute Care list provided to:: Patient Choice offered to / list presented to : Patient      Discharge Placement              Patient chooses bed at: [facility] Patient to be transferred to facility by: Loretha Stapler; Spouse; [phone] Name of family member notified: Mccabe Gloria; Spouse; [phone] Patient and family notified of of transfer: 04/02/23  Discharge Plan and Services Additional resources added to the After Visit Summary for   In-house Referral: Clinical Social Work Discharge Planning Services: CM Consult Post Acute Care Choice: Skilled Nursing Facility                               Social Drivers of Health (SDOH) Interventions SDOH Screenings   Food Insecurity: No Food Insecurity (03/27/2023)  Housing: Unknown (03/27/2023)  Transportation Needs: No Transportation Needs (03/27/2023)  Utilities: Not At Risk  (03/27/2023)  Depression (PHQ2-9): Low Risk  (01/22/2023)  Financial Resource Strain: Low Risk  (01/22/2023)  Physical Activity: Sufficiently Active (01/22/2023)  Social Connections: Socially Integrated (03/27/2023)  Stress: No Stress Concern Present (01/22/2023)  Tobacco Use: Low Risk  (03/26/2023)  Health Literacy: Adequate Health Literacy (01/22/2023)     Readmission Risk Interventions     No data to display

## 2023-04-02 NOTE — Progress Notes (Signed)
Report called to [facility].

## 2023-04-02 NOTE — Plan of Care (Signed)
  Problem: Activity: Goal: Risk for activity intolerance will decrease Outcome: Not Progressing    Problem: Safety: Goal: Ability to remain free from injury will improve Outcome: Not Progressing

## 2023-04-02 NOTE — TOC Progression Note (Addendum)
Transition of Care (TOC) - Progression Note    Patient Details  Name: Mark Benninger Sr. MRN: [id number] Date of Birth: 1950-06-08  Transition of Care [facility]) CM/SW Contact  Marliss Coots, LCSW Phone Number: 04/02/2023, 10:19 AM  Clinical Narrative:     10:19 AM CSW returned to patient's bedside to inform patient of discharge to SNF today and inquire about transportation. Patient informed CSW that patient's spouse and/or adult son could provide transportation. CSW informed patient of ambulance transportation option with a possibility of a copay to be billed upon service. Patient expressed preference in spouse and/or adult son providing transportation if possible. CSW offered to return to room to follow up when spouse arrives, which patient agreed to.  11:53 AM CSW returned to patient's bedside to follow up on transportation plans. Patient's spouse, Sheral Flow, was on the phone. Patient provided CSW with phone to speak with Edwina. Edwina and patient agreed to discharge to SNF by private vehicle.  Expected Discharge Plan: Skilled Nursing Facility Barriers to Discharge: Continued Medical Work up, SNF Pending bed offer  Expected Discharge Plan and Services In-house Referral: Clinical Social Work Discharge Planning Services: CM Consult Post Acute Care Choice: Skilled Nursing Facility Living arrangements for the past 2 months: Single Family Home Expected Discharge Date: 04/02/23                                     Social Determinants of Health (SDOH) Interventions SDOH Screenings   Food Insecurity: No Food Insecurity (03/27/2023)  Housing: Unknown (03/27/2023)  Transportation Needs: No Transportation Needs (03/27/2023)  Utilities: Not At Risk (03/27/2023)  Depression (PHQ2-9): Low Risk  (01/22/2023)  Financial Resource Strain: Low Risk  (01/22/2023)  Physical Activity: Sufficiently Active (01/22/2023)  Social Connections: Socially Integrated (03/27/2023)  Stress: No Stress  Concern Present (01/22/2023)  Tobacco Use: Low Risk  (03/26/2023)  Health Literacy: Adequate Health Literacy (01/22/2023)    Readmission Risk Interventions     No data to display

## 2023-04-02 NOTE — Care Management Important Message (Addendum)
Important Message  Patient Details  Name: Mark Vallery Sr. MRN: [id number] Date of Birth: 1950/10/14   Important Message Given:  Yes - Medicare IM  Patient discharged prior to IM delivery will a copy to the patient home address.   Deronda Christian 04/02/2023, 11:34 AM

## 2023-04-02 NOTE — Care Management Important Message (Signed)
Important Message  Patient Details  Name: Mark Whiters Sr. MRN: [id number] Date of Birth: Oct 06, 1950   Important Message Given:  Yes - Medicare IM     Dorena Bodo 04/02/2023, 4:22 PM

## 2023-04-02 NOTE — Discharge Summary (Signed)
Physician Discharge Summary  Mark Mcgann Retsof Sr. 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  He been having intermittent abdominal pain for the last several days.  In the ED was found to have blood glucose of 567 potassium of 6.7, bicarb of 20, creatinine of 2.9 and beta hydroxybutyrate of 4.26.  Patient was given IV fluid resuscitation, insulin infusion and admitted to the hospital for DKA, AKI and hyperkalemia.   Patient's DKA, AKI, hypokalemia and hyponatremia resolved.  He was transitioned to subcu insulin.  Blood glucose stabilized on basal and short acting insulin.  He is discharged on Lantus 45 units daily and moderating sliding scale.  Discontinued Jardiance and metformin on discharge.  Recommend reassessing glycemic control and adjusting insulin as appropriate.   Patient has symptomatic anemia with hemoglobin dropping from 13-7.5 without overt bleeding.  Last known hemoglobin was 12.5 in 02/2022.  Received 1 unit of PRBC on 1/27.  Hgb improved to 8.1.  Anemia panel consistent with anemia of chronic disease.  Recheck CBC in 1 week.  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    See individual problem list below for more.   Problems addressed during this hospitalization Uncontrolled NIDDM-2 with diabetic ketoacidosis: Patient is only on Jardiance and metformin at home.  A1c 10.3%.    -Discharged on Lantus 45 units daily -Continue SSI-moderate scale -Discontinued metformin and Jardiance. -Continue low-dose Zocor   AKI on CKD-3B: Cr 2.95 on admission.  Baseline Cr ~1.3-1.6.  AKI resolved.  Last creatinine 1.3. -Recommend referral to nephrology  Symptomatic anemia of renal disease: Hgb dropped from 13-7.5.  No overt bleeding.  Unknown baseline.  Not on anticoagulation or antiplatelet.  He was traumatic.  Transfused 1 unit on 1/27.  Hgb improved to 8.1.  Anemia panel consistent with anemia of chronic disease. -Continue p.o. ferrous sulfate -Check CBC in 1 week -Referral to nephrology as above   Essential hypertension: Normotensive -Continue home Toprol-XL -Discontinue HCTZ.  Will not be effective and CKD-3B.   Hyperkalemia/hyponatremia: Resolved   Hypomagnesemia: Resolved  Increased nutrient needs/hypoalbuminemia Nutrition Problem: Increased nutrient needs Etiology: acute illness Signs/Symptoms: estimated needs Interventions: MVI, Glucerna, Education     Time spent 35 minutes  Vital signs Vitals:   04/01/23 1607 04/01/23 1937 04/02/23 0435 04/02/23 0818  BP: 131/63 (!) 122/53 122/64 131/72  Pulse: 92 99 99 99  Temp: 98.4 F (36.9 C) 99.1 F (37.3 C) 99.3 F (37.4 C) 98.3 F (36.8 C)  Resp: 18 17 18 18   Height:      Weight:      SpO2:  92% 95% 91%  TempSrc:  Oral Oral Oral  BMI (Calculated):         Discharge exam  GENERAL: No apparent distress.  Nontoxic. HEENT: MMM.  Vision and hearing grossly intact.  NECK: Supple.  No apparent JVD.  RESP:  No IWOB.  Fair aeration bilaterally. CVS:  RRR. Heart sounds normal.  ABD/GI/GU: BS+. Abd soft, NTND.  MSK/EXT:  Moves extremities.  Significant muscle mass and subcu fat loss.  Trace bilateral ankle edema. SKIN: no apparent skin lesion or wound NEURO: Awake and alert. Oriented appropriately.  No apparent focal neuro deficit. PSYCH: Calm. Normal affect.   Discharge Instructions Discharge  Instructions     Diet - low sodium heart healthy   Complete by: As directed    Diet Carb Modified   Complete by: As directed    Increase activity slowly   Complete by: As directed       Allergies as of 04/02/2023   No Known Allergies      Medication List     STOP taking these medications    CRANBERRY PO   GARLIC PO   gentamicin cream 0.1 % Commonly known as: GARAMYCIN   hydrochlorothiazide 25 MG tablet Commonly known as: HYDRODIURIL   Jardiance 10 MG Tabs tablet Generic drug: empagliflozin   meloxicam 7.5 MG tablet Commonly known as: MOBIC   metFORMIN 1000 MG tablet Commonly known as: GLUCOPHAGE   sildenafil 100 MG tablet Commonly known as: Viagra   triamcinolone cream 0.1 % Commonly known as: KENALOG   TURMERIC PO   VITAMIN D PO       TAKE these medications    Accu-Chek Guide Me w/Device Kit Use as directed   Accu-Chek Guide test strip Generic drug: glucose blood USE AS INSTRUCTED   Accu-Chek Softclix Lancets lancets USE AS DIRECTED   acetaminophen 500 MG tablet Commonly known as: TYLENOL Take 1,000 mg by mouth every 6 (six) hours as needed for moderate pain.   b complex vitamins capsule Take 1 capsule by mouth daily.   ferrous sulfate 325 (65 FE) MG tablet Take 1 tablet (325 mg total) by mouth daily with breakfast. What changed:  medication strength how much to take when to take this   gabapentin 100 MG capsule Commonly known as: NEURONTIN TAKE 2 CAPSULES BY MOUTH AT BEDTIME.   Glucerna 1.0 Cal/CarbSteady Liqd Take 237 mLs by mouth in the morning and at bedtime.   hydrOXYzine 10 MG tablet Commonly known as: ATARAX Take 1-2 tablets (10-20 mg total) by mouth 3 (three) times daily as needed for itching.   insulin aspart 100 UNIT/ML injection Commonly known as: novoLOG Inject 0-15 Units into the skin 3 (three) times daily with meals. CBG 70 - 120: 0 units CBG 121 - 150: 2 units CBG 151 - 200: 3 units CBG 201 - 250: 5 units CBG  251 - 300: 8 units CBG 301 - 350: 11 units CBG 351 - 400: 15 units CBG > 400: call MD and obtain STAT lab verification   insulin glargine-yfgn 100 UNIT/ML injection Commonly known as: SEMGLEE Inject 0.45 mLs (45 Units total) into the skin daily. Start taking on: April 03, 2023   ketoconazole 2 % cream Commonly known as: NIZORAL Apply 1 Application topically daily. For 2 weeks- if not improved- can stop and let me know What changed:  when to take this reasons to take this   metoprolol succinate 100 MG 24 hr tablet Commonly known as: TOPROL-XL TAKE 1 TABLET BY MOUTH WITH OR IMMEDIATELY FOLLOWING A MEAL.   multivitamin with minerals Tabs tablet Take 1 tablet by mouth daily. Start taking on: April 03, 2023   sildenafil 20 MG tablet Commonly known as: REVATIO Take 1-5 tablets as needed.   simvastatin 10 MG tablet Commonly known as: ZOCOR TAKE 1 TABLET BY MOUTH EVERY DAY   VISINE-A OP Place 1 drop into both eyes  daily as needed (dry eyes).   VITAMIN C PO Take 1 tablet by mouth daily.        Consultations: Diabetic coordinator  Procedures/Studies:   No results found.     The results of significant diagnostics from this hospitalization (including imaging, microbiology, ancillary and laboratory) are listed below for reference.     Microbiology: Recent Results (from the past 240 hours)  Resp panel by RT-PCR (RSV, Flu A&B, Covid) Anterior Nasal Swab     Status: None   Collection Time: 03/26/23  5:00 PM   Specimen: Anterior Nasal Swab  Result Value Ref Range Status   SARS Coronavirus 2 by RT PCR NEGATIVE NEGATIVE Final   Influenza A by PCR NEGATIVE NEGATIVE Final   Influenza B by PCR NEGATIVE NEGATIVE Final    Comment: (NOTE) The Xpert Xpress SARS-CoV-2/FLU/RSV plus assay is intended as an aid in the diagnosis of influenza from Nasopharyngeal swab specimens and should not be used as a sole basis for treatment. Nasal washings and aspirates are unacceptable  for Xpert Xpress SARS-CoV-2/FLU/RSV testing.  Fact Sheet for Patients: BloggerCourse.com  Fact Sheet for Healthcare Providers: SeriousBroker.it  This test is not yet approved or cleared by the [place] FDA and has been authorized for detection and/or diagnosis of SARS-CoV-2 by FDA under an Emergency Use Authorization (EUA). This EUA will remain in effect (meaning this test can be used) for the duration of the COVID-19 declaration under Section 564(b)(1) of the Act, 21 U.S.C. section 360bbb-3(b)(1), unless the authorization is terminated or revoked.     Resp Syncytial Virus by PCR NEGATIVE NEGATIVE Final    Comment: (NOTE) Fact Sheet for Patients: BloggerCourse.com  Fact Sheet for Healthcare Providers: SeriousBroker.it  This test is not yet approved or cleared by the [place] FDA and has been authorized for detection and/or diagnosis of SARS-CoV-2 by FDA under an Emergency Use Authorization (EUA). This EUA will remain in effect (meaning this test can be used) for the duration of the COVID-19 declaration under Section 564(b)(1) of the Act, 21 U.S.C. section 360bbb-3(b)(1), unless the authorization is terminated or revoked.  Performed at [facility], [address], [place], [place] [address]      Labs:  CBC: Recent Labs  Lab 03/26/23 1749 03/26/23 1809 03/28/23 0513 03/29/23 0533 04/01/23 0743 04/02/23 0502  WBC 15.8*  --  13.2* 11.9* 11.6* 11.6*  NEUTROABS  --   --  11.4* 9.9*  --   --   HGB 12.9* 14.3 8.8* 8.7* 7.5* 8.1*  HCT 40.5 42.0 27.3* 27.4* 23.4* 25.5*  MCV 96.2  --  94.5 94.8 95.9 95.9  PLT 350  --  294 320 308 313   BMP &GFR Recent Labs  Lab 03/28/23 0513 03/29/23 0533 03/30/23 2110 04/01/23 0743 04/02/23 0502  NA 139 149* 140 136 140  K 3.8 3.9 3.6 3.6 3.7  CL 104 111 104 101 105  CO2 24 28 27 26 27   GLUCOSE 320* 180* 124*  243* 88  BUN 69* 43* 25* 16 15  CREATININE 1.93* 1.59* 1.26* 1.30* 1.30*  CALCIUM 8.2* 8.8* 8.5* 8.0* 8.1*  MG 2.4  --   --  1.6* 1.8  PHOS 2.7  --   --  2.0* 2.4*   Estimated Creatinine Clearance: 63.7 mL/min (A) (by C-G formula based on SCr of 1.3 mg/dL (H)). Liver & Pancreas: Recent Labs  Lab 03/26/23 1749 03/28/23 0513 04/01/23 0743 04/02/23 0502  AST 57* 109*  --   --  ALT 39 44  --   --   ALKPHOS 106 77  --   --   BILITOT 1.4* 0.7  --   --   PROT 7.3 5.8*  --   --   ALBUMIN 2.1* 1.5* <1.5* <1.5*   No results for input(s): "LIPASE", "AMYLASE" in the last 168 hours. No results for input(s): "AMMONIA" in the last 168 hours. Diabetic: No results for input(s): "HGBA1C" in the last 72 hours. Recent Labs  Lab 04/01/23 0810 04/01/23 1154 04/01/23 1725 04/01/23 1941 04/02/23 0836  GLUCAP 234* 281* 140* 124* 137*   Cardiac Enzymes: No results for input(s): "CKTOTAL", "CKMB", "CKMBINDEX", "TROPONINI" in the last 168 hours. No results for input(s): "PROBNP" in the last 8760 hours. Coagulation Profile: No results for input(s): "INR", "PROTIME" in the last 168 hours. Thyroid Function Tests: No results for input(s): "TSH", "T4TOTAL", "FREET4", "T3FREE", "THYROIDAB" in the last 72 hours. Lipid Profile: Recent Labs    04/02/23 0502  CHOL 81  HDL 19*  LDLCALC 46  TRIG 81  CHOLHDL 4.3   Anemia Panel: Recent Labs    04/01/23 0939  VITAMINB12 2,334*  FOLATE 14.2  FERRITIN 1,038*  TIBC 176*  IRON 12*  RETICCTPCT 1.4   Urine analysis:    Component Value Date/Time   COLORURINE YELLOW 01/17/2021 1038   APPEARANCEUR CLEAR 01/17/2021 1038   LABSPEC 1.023 01/17/2021 1038   PHURINE 5.0 01/17/2021 1038   GLUCOSEU >=500 (A) 01/17/2021 1038   HGBUR NEGATIVE 01/17/2021 1038   HGBUR negative 06/21/2009 1012   BILIRUBINUR NEGATIVE 01/17/2021 1038   BILIRUBINUR n 01/25/2016 1311   KETONESUR 5 (A) 01/17/2021 1038   PROTEINUR NEGATIVE 01/17/2021 1038   UROBILINOGEN 1.0  09/26/2019 1514   NITRITE NEGATIVE 01/17/2021 1038   LEUKOCYTESUR NEGATIVE 01/17/2021 1038   Sepsis Labs: Invalid input(s): "PROCALCITONIN", "LACTICIDVEN"   SIGNED:  Almon Hercules, MD  [facility] 04/02/2023, 11:20 AM

## 2023-04-03 DIAGNOSIS — E1122 Type 2 diabetes mellitus with diabetic chronic kidney disease: Secondary | ICD-10-CM | POA: Diagnosis not present

## 2023-04-03 DIAGNOSIS — Z7189 Other specified counseling: Secondary | ICD-10-CM | POA: Diagnosis not present

## 2023-04-03 DIAGNOSIS — E1110 Type 2 diabetes mellitus with ketoacidosis without coma: Secondary | ICD-10-CM | POA: Diagnosis not present

## 2023-04-03 DIAGNOSIS — D631 Anemia in chronic kidney disease: Secondary | ICD-10-CM | POA: Diagnosis not present

## 2023-04-03 DIAGNOSIS — N1832 Chronic kidney disease, stage 3b: Secondary | ICD-10-CM | POA: Diagnosis not present

## 2023-04-03 DIAGNOSIS — I129 Hypertensive chronic kidney disease with stage 1 through stage 4 chronic kidney disease, or unspecified chronic kidney disease: Secondary | ICD-10-CM | POA: Diagnosis not present

## 2023-04-03 DIAGNOSIS — Z8639 Personal history of other endocrine, nutritional and metabolic disease: Secondary | ICD-10-CM | POA: Diagnosis not present

## 2023-04-03 DIAGNOSIS — I700 Atherosclerosis of aorta: Secondary | ICD-10-CM | POA: Diagnosis not present

## 2023-04-03 DIAGNOSIS — Z7984 Long term (current) use of oral hypoglycemic drugs: Secondary | ICD-10-CM | POA: Diagnosis not present

## 2023-04-03 NOTE — Care Management Important Message (Signed)
Important Message  Patient Details  Name: Mark Sem Sr. MRN: [id number] Date of Birth: 07/09/1950   Important Message Given:  Yes - Medicare IM  Patient left prior to IM delivery will mail a copy to the patient home address.l   Reign Bartnick 04/03/2023, 8:07 AM

## 2023-04-04 DIAGNOSIS — E1122 Type 2 diabetes mellitus with diabetic chronic kidney disease: Secondary | ICD-10-CM | POA: Diagnosis not present

## 2023-04-04 DIAGNOSIS — N1832 Chronic kidney disease, stage 3b: Secondary | ICD-10-CM | POA: Diagnosis not present

## 2023-04-04 DIAGNOSIS — M19022 Primary osteoarthritis, left elbow: Secondary | ICD-10-CM | POA: Diagnosis not present

## 2023-04-04 DIAGNOSIS — E1110 Type 2 diabetes mellitus with ketoacidosis without coma: Secondary | ICD-10-CM | POA: Diagnosis not present

## 2023-04-04 DIAGNOSIS — M5451 Vertebrogenic low back pain: Secondary | ICD-10-CM | POA: Diagnosis not present

## 2023-04-04 DIAGNOSIS — M170 Bilateral primary osteoarthritis of knee: Secondary | ICD-10-CM | POA: Diagnosis not present

## 2023-04-04 DIAGNOSIS — N179 Acute kidney failure, unspecified: Secondary | ICD-10-CM | POA: Diagnosis not present

## 2023-04-04 DIAGNOSIS — D631 Anemia in chronic kidney disease: Secondary | ICD-10-CM | POA: Diagnosis not present

## 2023-04-04 DIAGNOSIS — I129 Hypertensive chronic kidney disease with stage 1 through stage 4 chronic kidney disease, or unspecified chronic kidney disease: Secondary | ICD-10-CM | POA: Diagnosis not present

## 2023-04-05 DIAGNOSIS — R52 Pain, unspecified: Secondary | ICD-10-CM | POA: Diagnosis not present

## 2023-04-05 DIAGNOSIS — R059 Cough, unspecified: Secondary | ICD-10-CM | POA: Diagnosis not present

## 2023-04-05 DIAGNOSIS — M6281 Muscle weakness (generalized): Secondary | ICD-10-CM | POA: Diagnosis not present

## 2023-04-08 ENCOUNTER — Other Ambulatory Visit: Payer: Self-pay

## 2023-04-08 ENCOUNTER — Encounter: Payer: Self-pay | Admitting: Family Medicine

## 2023-04-08 DIAGNOSIS — Z719 Counseling, unspecified: Secondary | ICD-10-CM | POA: Diagnosis not present

## 2023-04-08 DIAGNOSIS — J189 Pneumonia, unspecified organism: Secondary | ICD-10-CM | POA: Diagnosis not present

## 2023-04-08 DIAGNOSIS — I10 Essential (primary) hypertension: Secondary | ICD-10-CM | POA: Diagnosis not present

## 2023-04-08 DIAGNOSIS — R059 Cough, unspecified: Secondary | ICD-10-CM | POA: Diagnosis not present

## 2023-04-08 DIAGNOSIS — M6281 Muscle weakness (generalized): Secondary | ICD-10-CM | POA: Diagnosis not present

## 2023-04-08 DIAGNOSIS — R52 Pain, unspecified: Secondary | ICD-10-CM | POA: Diagnosis not present

## 2023-04-08 DIAGNOSIS — E119 Type 2 diabetes mellitus without complications: Secondary | ICD-10-CM

## 2023-04-08 DIAGNOSIS — E1122 Type 2 diabetes mellitus with diabetic chronic kidney disease: Secondary | ICD-10-CM | POA: Diagnosis not present

## 2023-04-10 DIAGNOSIS — N1832 Chronic kidney disease, stage 3b: Secondary | ICD-10-CM | POA: Diagnosis not present

## 2023-04-10 DIAGNOSIS — I129 Hypertensive chronic kidney disease with stage 1 through stage 4 chronic kidney disease, or unspecified chronic kidney disease: Secondary | ICD-10-CM | POA: Diagnosis not present

## 2023-04-10 DIAGNOSIS — N179 Acute kidney failure, unspecified: Secondary | ICD-10-CM | POA: Diagnosis not present

## 2023-04-10 DIAGNOSIS — Z794 Long term (current) use of insulin: Secondary | ICD-10-CM | POA: Diagnosis not present

## 2023-04-10 DIAGNOSIS — J189 Pneumonia, unspecified organism: Secondary | ICD-10-CM | POA: Diagnosis not present

## 2023-04-10 DIAGNOSIS — Z719 Counseling, unspecified: Secondary | ICD-10-CM | POA: Diagnosis not present

## 2023-04-10 DIAGNOSIS — M170 Bilateral primary osteoarthritis of knee: Secondary | ICD-10-CM | POA: Diagnosis not present

## 2023-04-10 DIAGNOSIS — E1122 Type 2 diabetes mellitus with diabetic chronic kidney disease: Secondary | ICD-10-CM | POA: Diagnosis not present

## 2023-04-10 DIAGNOSIS — D631 Anemia in chronic kidney disease: Secondary | ICD-10-CM | POA: Diagnosis not present

## 2023-04-10 DIAGNOSIS — N189 Chronic kidney disease, unspecified: Secondary | ICD-10-CM | POA: Diagnosis not present

## 2023-04-10 DIAGNOSIS — M5451 Vertebrogenic low back pain: Secondary | ICD-10-CM | POA: Diagnosis not present

## 2023-04-10 DIAGNOSIS — E1110 Type 2 diabetes mellitus with ketoacidosis without coma: Secondary | ICD-10-CM | POA: Diagnosis not present

## 2023-04-11 ENCOUNTER — Telehealth: Payer: Self-pay | Admitting: Family Medicine

## 2023-04-11 DIAGNOSIS — E119 Type 2 diabetes mellitus without complications: Secondary | ICD-10-CM

## 2023-04-11 NOTE — Telephone Encounter (Signed)
 Referral has been placed.

## 2023-04-11 NOTE — Telephone Encounter (Signed)
 Copied from CRM [id number]. Topic: Referral - Request for Referral >> Apr 10, 2023  5:01 PM Drema MATSU wrote: Did the patient discuss referral with their provider in the last year? N/a  (If No - schedule appointment) (If Yes - send message)  Appointment offered? N/a   Type of order/referral and detailed reason for visit: Patient wants to enroll in the Diabetes class/ wants a referral to Nutrition and Diabetes  Preference of office, provider, location: Nutrition and Diabetes at [facility]   If referral order, have you been seen by this specialty before? No (If Yes, this issue or another issue? When? Where?  Can we respond through MyChart? Yes

## 2023-04-12 DIAGNOSIS — E871 Hypo-osmolality and hyponatremia: Secondary | ICD-10-CM | POA: Diagnosis not present

## 2023-04-12 DIAGNOSIS — I700 Atherosclerosis of aorta: Secondary | ICD-10-CM | POA: Diagnosis not present

## 2023-04-12 DIAGNOSIS — N1832 Chronic kidney disease, stage 3b: Secondary | ICD-10-CM | POA: Diagnosis not present

## 2023-04-12 DIAGNOSIS — M170 Bilateral primary osteoarthritis of knee: Secondary | ICD-10-CM | POA: Diagnosis not present

## 2023-04-12 DIAGNOSIS — I129 Hypertensive chronic kidney disease with stage 1 through stage 4 chronic kidney disease, or unspecified chronic kidney disease: Secondary | ICD-10-CM | POA: Diagnosis not present

## 2023-04-12 DIAGNOSIS — D631 Anemia in chronic kidney disease: Secondary | ICD-10-CM | POA: Diagnosis not present

## 2023-04-12 DIAGNOSIS — E1122 Type 2 diabetes mellitus with diabetic chronic kidney disease: Secondary | ICD-10-CM | POA: Diagnosis not present

## 2023-04-12 DIAGNOSIS — M19022 Primary osteoarthritis, left elbow: Secondary | ICD-10-CM | POA: Diagnosis not present

## 2023-04-12 DIAGNOSIS — E785 Hyperlipidemia, unspecified: Secondary | ICD-10-CM | POA: Diagnosis not present

## 2023-04-16 ENCOUNTER — Telehealth: Payer: Self-pay

## 2023-04-16 NOTE — Telephone Encounter (Signed)
Patient was identified as falling into the [place] Measure - Diabetes.   Patient was: Appointment scheduled with primary care provider in the next 30 days.

## 2023-04-17 ENCOUNTER — Ambulatory Visit: Payer: Self-pay | Admitting: Family Medicine

## 2023-04-17 DIAGNOSIS — M170 Bilateral primary osteoarthritis of knee: Secondary | ICD-10-CM | POA: Diagnosis not present

## 2023-04-17 DIAGNOSIS — I129 Hypertensive chronic kidney disease with stage 1 through stage 4 chronic kidney disease, or unspecified chronic kidney disease: Secondary | ICD-10-CM | POA: Diagnosis not present

## 2023-04-17 DIAGNOSIS — E1122 Type 2 diabetes mellitus with diabetic chronic kidney disease: Secondary | ICD-10-CM | POA: Diagnosis not present

## 2023-04-17 DIAGNOSIS — D631 Anemia in chronic kidney disease: Secondary | ICD-10-CM | POA: Diagnosis not present

## 2023-04-17 DIAGNOSIS — I700 Atherosclerosis of aorta: Secondary | ICD-10-CM | POA: Diagnosis not present

## 2023-04-17 DIAGNOSIS — M19022 Primary osteoarthritis, left elbow: Secondary | ICD-10-CM | POA: Diagnosis not present

## 2023-04-17 DIAGNOSIS — E871 Hypo-osmolality and hyponatremia: Secondary | ICD-10-CM | POA: Diagnosis not present

## 2023-04-17 DIAGNOSIS — N1832 Chronic kidney disease, stage 3b: Secondary | ICD-10-CM | POA: Diagnosis not present

## 2023-04-17 DIAGNOSIS — E785 Hyperlipidemia, unspecified: Secondary | ICD-10-CM | POA: Diagnosis not present

## 2023-04-17 NOTE — Telephone Encounter (Signed)
See nurse triage note.

## 2023-04-17 NOTE — Telephone Encounter (Unsigned)
Copied from CRM [id number]. Topic: Clinical - Pink Word Triage >> Apr 17, 2023 11:29 AM Fredrich Romans wrote: Reason for Triage: constipation x 3-4 days

## 2023-04-17 NOTE — Telephone Encounter (Signed)
Called patient back: no answer: left message.  Will attempt to reach out again.

## 2023-04-17 NOTE — Telephone Encounter (Signed)
Noted

## 2023-04-17 NOTE — Telephone Encounter (Signed)
Info only call:  Pt was messaging PCP through Mychart and received advice.  Pt needed advice to relieve  constipation. Pt stated he has been helped and has no further needs or concerns.            Reason for Disposition  General information question, no triage required and triager able to answer question  Answer Assessment - Initial Assessment Questions 1. REASON FOR CALL or QUESTION: "What is your reason for calling today?" or "How can I best help you?" or "What question do you have that I can help answer?"     Pt was messaging PCP through Mychart and received advice.  Pt needed advice to relieve  constipation. Pt stated he has been helped and has not further needs or concerns.  Protocols used: Information Only Call - No Triage-A-AH

## 2023-04-17 NOTE — Telephone Encounter (Signed)
CRM placed on incorrect encounter. Please see triage note from 04/17/23.

## 2023-04-18 ENCOUNTER — Telehealth: Payer: Self-pay

## 2023-04-18 ENCOUNTER — Encounter: Payer: Medicare PPO | Attending: Family Medicine | Admitting: Dietician

## 2023-04-18 VITALS — Ht 76.5 in | Wt 219.0 lb

## 2023-04-18 DIAGNOSIS — E119 Type 2 diabetes mellitus without complications: Secondary | ICD-10-CM | POA: Insufficient documentation

## 2023-04-18 NOTE — Patient Instructions (Signed)
Plan:  Aim for 4-5 Carb Choices per meal (60-75 grams) +/- 1 either way  Aim for 0-1 Carbs per snack if hungry  Include protein in moderation with your meals and snacks Consider reading food labels for Total Carbohydrate of foods Consider  increasing your activity level by walking for 30 minutes daily as tolerated Continue checking BG at alternate times per day  Continue taking medication  as directed by MD No dark sodas  Limit foods with phosphorus  Aim for single digit % daily value for sodium on food labels Aim for sodium in mg to be less than calories per serving  Aim for low added sugar Low sodium silver palate - low sodium pasta sauce

## 2023-04-18 NOTE — Progress Notes (Unsigned)
Diabetes Self-Management Education  Visit Type: First/Initial  Appt. Start Time: 1650 Appt. End Time: 1830  04/19/2023  [name], identified by name and date of birth, is a 73 y.o. male with a diagnosis of Diabetes: Type 2.   ASSESSMENT Patient is here today with his wife.    Referral:  Type 2 Diabetes Patient was recently hospitalized due to a fall.  He was also found to be in DKA at that time.    History:  Type 2 DM (2022), hyperlipidemia , hypertension Medications/Supplements include:  Semglee 45 units daily, Novolog per sliding scale (not taking), Vitamin B12 , Iron, garlic, mens daily MVI Labs noted to include: A1C - 10.3% 03/27/23 and 12.2% 02/13/2022 , GFR - 49 as of 10/14/2020 CGM:  freestyle libre 3+  CGM Results from download: 04/18/23  % Time CGM active:   98 %   (Goal >70%)  Average glucose:   147 mg/dL for 14 days  Glucose management indicator:   6.6 %  Time in range (70-180 mg/dL):   78 %   (Goal >21%)  Time High (181-250 mg/dL):   20 %   (Goal < 30%)  Time Very High (>250 mg/dL):    2 %   (Goal < 5%)  Time Low (54-69 mg/dL):   0 %   (Goal <8%)  Time Very Low (<54 mg/dL):   0 %   (Goal <6%)  %CV (glucose variability)    0 %  (Goal <36%)   Weight hx: 64.5" 219 lbs  04/19/2023 207 lbs 01/22/2023 280 lbs UBW for years  Patient lives with his wife  Both shop for groceries , she cooks.   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 Walks stairs in his home or walking to the mail box to increase activity gradually.  He was more active prior to his recent hospitalization.  Height 6' 4.5" (1.943 m), weight 219 lb (99.3 kg). Body mass index is 26.31 kg/m.   Diabetes Self-Management Education - 04/18/23 1714       Visit Information   Visit Type First/Initial      Initial Visit   Diabetes Type Type 2    Are you currently following a meal plan?  No    Are you taking your medications as prescribed? Yes      Health Coping   How would you rate your overall health? Good      Psychosocial Assessment   Patient Belief/Attitude about Diabetes Motivated to manage diabetes    What is the hardest part about your diabetes right now, causing you the most concern, or is the most worrisome to you about your diabetes?   Making healty food and beverage choices    Self-care barriers Unsteady gait/risk for falls    Self-management support Doctor's office    Other persons present Spouse/SO    Patient Concerns Nutrition/Meal planning    Special Needs None    Preferred Learning Style Visual;Auditory;Hands on    Learning Readiness Ready    How often do you need to have someone help you when you read instructions, pamphlets, or other written materials from your doctor or pharmacy? 1 - Never      Pre-Education Assessment   Patient understands the diabetes disease and treatment process. Needs Instruction    Patient understands incorporating nutritional management into lifestyle. Needs Instruction    Patient undertands incorporating  physical activity into lifestyle. Needs Instruction    Patient understands using medications safely. Needs Instruction    Patient understands monitoring blood glucose, interpreting and using results Needs Instruction    Patient understands prevention, detection, and treatment of acute complications. Needs Instruction    Patient understands prevention, detection, and treatment of chronic complications. Needs Instruction    Patient understands how to develop strategies to address psychosocial issues. Needs Instruction    Patient understands how to develop strategies to promote health/change behavior. Needs Instruction      Complications   Last HgB A1C per patient/outside source 10.3 %   03/27/2023   How often do you check your blood sugar? > 4 times/day   Finger pricks in the morning ,but also has a CGM   Fasting Blood glucose  range (mg/dL) 16-109    Postprandial Blood glucose range (mg/dL) 604-540    Number of hypoglycemic episodes per month 1    Can you tell when your blood sugar is low? Yes    What do you do if your blood sugar is low? 4 glucose tablets with water    Number of hyperglycemic episodes ( >200mg /dL): Occasional    Can you tell when your blood sugar is high? No    Have you had a dilated eye exam in the past 12 months? Yes    Have you had a dental exam in the past 12 months? Yes    Are you checking your feet? Yes    How many days per week are you checking your feet? 7      Dietary Intake   Breakfast Oatmeal with cinnamon and honey and 1 boiled egg 2 slices of bacon and premier protein drink and water    Snack (morning) pecans , walnuts or [place] nuts    Lunch tuna sandwich or a piece of chicken , peanut butter and crackers or a salad or [employer] with low sugar bush baked beans with cinnamon    Beverage(s) water , premier protein shake, zero sugar cranberry juice , zero sugar ginger ale      Activity / Exercise   Activity / Exercise Type ADL's      Patient Education   Previous Diabetes Education No    Disease Pathophysiology Definition of diabetes, type 1 and 2, and the diagnosis of diabetes;Explored patient's options for treatment of their diabetes    Healthy Eating Role of diet in the treatment of diabetes and the relationship between the three main macronutrients and blood glucose level;Food label reading, portion sizes and measuring food.;Plate Method;Meal options for control of blood glucose level and chronic complications.;Information on hints to eating out and maintain blood glucose control.;Reviewed blood glucose goals for pre and post meals and how to evaluate the patients' food intake on their blood glucose level.    Being Active Role of exercise on diabetes management, blood pressure control and cardiac health.;Helped patient identify appropriate exercises in  relation to his/her diabetes, diabetes complications and other health issue.    Medications Reviewed patients medication for diabetes, action, purpose, timing of dose and side effects.;Taught/reviewed insulin/injectables, injection, site rotation, insulin/injectables storage and needle disposal.    Monitoring Taught/evaluated CGM (comment);Identified appropriate SMBG and/or A1C goals.    Acute complications Taught prevention, symptoms, and  treatment of hypoglycemia - the 15 rule.;Discussed and identified patients' prevention, symptoms, and treatment of hyperglycemia.    Chronic complications Relationship between chronic complications and blood glucose control;Retinopathy and reason  for yearly dilated eye exams;Assessed and discussed foot care and prevention of foot problems    Diabetes Stress and Support Identified and addressed patients feelings and concerns about diabetes;Worked with patient to identify barriers to care and solutions;Role of stress on diabetes      Individualized Goals (developed by patient)   Nutrition General guidelines for healthy choices and portions discussed    Physical Activity Exercise 5-7 days per week;15 minutes per day    Medications take my medication as prescribed    Monitoring  Consistenly use CGM    Problem Solving Eating Pattern;Addressing barriers to behavior change    Reducing Risk examine blood glucose patterns;treat hypoglycemia with 15 grams of carbs if blood glucose less than 70mg /dL;check ketones if blood glucose over 240mg /dL;do foot checks daily      Post-Education Assessment   Patient understands the diabetes disease and treatment process. Demonstrates understanding / competency    Patient understands incorporating nutritional management into lifestyle. Comprehends key points    Patient undertands incorporating physical activity into lifestyle. Comprehends key points    Patient understands using medications safely. Demonstrates understanding /  competency    Patient understands monitoring blood glucose, interpreting and using results Demonstrates understanding / competency    Patient understands prevention, detection, and treatment of acute complications. Comprehends key points    Patient understands prevention, detection, and treatment of chronic complications. Demonstrates understanding / competency    Patient understands how to develop strategies to address psychosocial issues. Demonstrates understanding / competency    Patient understands how to develop strategies to promote health/change behavior. Comprehends key points      Outcomes   Expected Outcomes Demonstrated interest in learning. Expect positive outcomes    Future DMSE 3-4 months    Program Status Not Completed             Individualized Plan for Diabetes Self-Management Training:   Learning Objective:  Patient will have a greater understanding of diabetes self-management. Patient education plan is to attend individual and/or group sessions per assessed needs and concerns.   Plan:   Patient Instructions  Plan:  Aim for 4-5 Carb Choices per meal (60-75 grams) +/- 1 either way  Aim for 0-1 Carbs per snack if hungry  Include protein in moderation with your meals and snacks Consider reading food labels for Total Carbohydrate of foods Consider  increasing your activity level by walking for 30 minutes daily as tolerated Continue checking BG at alternate times per day  Continue taking medication  as directed by MD No dark sodas  Limit foods with phosphorus  Aim for single digit % daily value for sodium on food labels Aim for sodium in mg to be less than calories per serving  Aim for low added sugar Low sodium silver palate - low sodium pasta sauce   Expected Outcomes:  Demonstrated interest in learning. Expect positive outcomes  Education material provided: ADA - How to Thrive: A Guide for Your Journey with Diabetes, Food label handouts, Meal plan card,  Snack sheet, and Diabetes Resources, dining out tips  If problems or questions, patient to contact team via:  Phone  Future DSME appointment: 3-4 months

## 2023-04-18 NOTE — Telephone Encounter (Signed)
Called and spoke with Cara and below VO given.  Copied from CRM [id number]. Topic: Clinical - Home Health Verbal Orders >> Apr 18, 2023  1:45 PM Denese Killings wrote: Caller/Agency: [facility] Callback Number: [phone] secured to leave a voicemail Service Requested: Skilled Nursing Frequency: 1x week for 4 weeks for diabetes management Any new concerns about the patient? Yes was recently hospitalized with DKA

## 2023-04-19 ENCOUNTER — Telehealth: Payer: Self-pay

## 2023-04-19 NOTE — Telephone Encounter (Signed)
Copied from CRM [id number]. Topic: Clinical - Medical Advice >> Apr 17, 2023  4:57 PM Alcus Dad wrote: Reason for CRM: [facility]) is calling for pt to get an order from Dr. for 3n1 potty chair.   Please see note above and advise on order for DME

## 2023-04-19 NOTE — Telephone Encounter (Signed)
You may send this in under generalized weakness or provide verbal order if that is all they need

## 2023-04-22 NOTE — Telephone Encounter (Signed)
Returned phone call and advised verbal orders per PCP for patient 3 in 1 potty chair. Nothing further needed at this time

## 2023-04-25 ENCOUNTER — Encounter: Payer: Self-pay | Admitting: Family Medicine

## 2023-04-25 ENCOUNTER — Telehealth: Payer: Self-pay | Admitting: Family Medicine

## 2023-04-25 ENCOUNTER — Ambulatory Visit: Payer: Medicare PPO

## 2023-04-25 ENCOUNTER — Ambulatory Visit: Payer: Medicare PPO | Admitting: Family Medicine

## 2023-04-25 VITALS — BP 154/74 | HR 70 | Temp 97.6°F | Ht 76.5 in | Wt 221.0 lb

## 2023-04-25 DIAGNOSIS — E119 Type 2 diabetes mellitus without complications: Secondary | ICD-10-CM

## 2023-04-25 DIAGNOSIS — Z8639 Personal history of other endocrine, nutritional and metabolic disease: Secondary | ICD-10-CM

## 2023-04-25 DIAGNOSIS — E785 Hyperlipidemia, unspecified: Secondary | ICD-10-CM

## 2023-04-25 DIAGNOSIS — D649 Anemia, unspecified: Secondary | ICD-10-CM | POA: Diagnosis not present

## 2023-04-25 DIAGNOSIS — I10 Essential (primary) hypertension: Secondary | ICD-10-CM | POA: Diagnosis not present

## 2023-04-25 DIAGNOSIS — Z7984 Long term (current) use of oral hypoglycemic drugs: Secondary | ICD-10-CM | POA: Diagnosis not present

## 2023-04-25 DIAGNOSIS — Z794 Long term (current) use of insulin: Secondary | ICD-10-CM

## 2023-04-25 DIAGNOSIS — E1169 Type 2 diabetes mellitus with other specified complication: Secondary | ICD-10-CM

## 2023-04-25 DIAGNOSIS — J189 Pneumonia, unspecified organism: Secondary | ICD-10-CM

## 2023-04-25 LAB — POCT GLYCOSYLATED HEMOGLOBIN (HGB A1C): Hemoglobin A1C: 7.1 % — AB (ref 4.0–5.6)

## 2023-04-25 MED ORDER — INSULIN PEN NEEDLE 32G X 6 MM MISC: 1.0000 | Freq: Every day | 11 refills | Status: AC

## 2023-04-25 MED ORDER — AMOXICILLIN-POT CLAVULANATE 875-125 MG PO TABS
1.0000 | ORAL_TABLET | Freq: Two times a day (BID) | ORAL | 0 refills | Status: AC
Start: 2023-04-25 — End: 2023-05-02

## 2023-04-25 MED ORDER — TRESIBA FLEXTOUCH 100 UNIT/ML ~~LOC~~ SOPN: 46.0000 [IU] | PEN_INJECTOR | Freq: Every day | SUBCUTANEOUS | 11 refills | Status: AC

## 2023-04-25 NOTE — Progress Notes (Signed)
Phone [phone] In person visit   Subjective:   Mark Kilgallon Sr. is a 73 y.o. year old very pleasant male patient who presents for/with See problem oriented charting Chief Complaint  Patient presents with   Diabetes   Past Medical History-  Patient Active Problem List   Diagnosis Date Noted   History of diabetic ketoacidosis 03/27/2023    Priority: High   Diabetes mellitus type II, controlled (HCC) 11/25/2006    Priority: High   Acute renal failure superimposed on stage 3b chronic kidney disease (HCC) 03/30/2023    Priority: Medium    Aortic atherosclerosis (HCC) 12/11/2019    Priority: Medium    Hyperlipidemia associated with type 2 diabetes mellitus (HCC) 02/21/2017    Priority: Medium    Essential hypertension 11/25/2006    Priority: Medium    History of small bowel obstruction 01/18/2021    Priority: Low   Erectile dysfunction 09/01/2014    Priority: Low   Anemia, chronic disease 07/14/2009    Priority: Low   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   Olecranon bursitis of left elbow 07/27/2021    Priority: 1.   Bilateral primary osteoarthritis of knee 04/06/2020    Priority: 1.   Right-sided low back pain with right-sided sciatica 01/01/2020    Priority: 1.    Medications- reviewed and updated Current Outpatient Medications  Medication Sig Dispense Refill   ACCU-CHEK GUIDE test strip USE AS INSTRUCTED 100 strip 5   Accu-Chek Softclix Lancets lancets USE AS DIRECTED 100 each 5   acetaminophen (TYLENOL) 500 MG tablet Take 1,000 mg by mouth every 6 (six) hours as needed for moderate pain.     amoxicillin-clavulanate (AUGMENTIN) 875-125 MG tablet Take 1 tablet by mouth 2 (two) times daily for 7 days. 14 tablet 0   Ascorbic Acid (VITAMIN C PO) Take 1 tablet by mouth daily.     b complex vitamins capsule Take 1 capsule by mouth daily.     Blood Glucose Monitoring Suppl (ACCU-CHEK GUIDE ME) w/Device KIT Use as directed 1 kit 1   ferrous sulfate  325 (65 FE) MG tablet Take 1 tablet (325 mg total) by mouth daily with breakfast.     gabapentin (NEURONTIN) 100 MG capsule TAKE 2 CAPSULES BY MOUTH AT BEDTIME. 60 capsule 0   hydrOXYzine (ATARAX) 10 MG tablet Take 1-2 tablets (10-20 mg total) by mouth 3 (three) times daily as needed for itching. 30 tablet 0   insulin degludec (TRESIBA FLEXTOUCH) 100 UNIT/ML FlexTouch Pen Inject 46 Units into the skin daily. LONG ACTING INSULIN 15 mL 11   Insulin Pen Needle 32G X 6 MM MISC 1 each by Does not apply route daily. 100 each 11   ketoconazole (NIZORAL) 2 % cream Apply 1 Application topically daily. For 2 weeks- if not improved- can stop and let me know (Patient taking differently: Apply 1 Application topically daily as needed for irritation. For 2 weeks- if not improved- can stop and let me know) 60 g 2   metoprolol succinate (TOPROL-XL) 100 MG 24 hr tablet TAKE 1 TABLET BY MOUTH WITH OR IMMEDIATELY FOLLOWING A MEAL. 90 tablet 0   Multiple Vitamin (MULTIVITAMIN WITH MINERALS) TABS tablet Take 1 tablet by mouth daily.     Naphazoline-Pheniramine (VISINE-A OP) Place 1 drop into both eyes daily as needed (dry eyes).     Nutritional Supplements (GLUCERNA 1.0 CAL/CARBSTEADY) LIQD Take 237 mLs by mouth in the morning and at bedtime.  sildenafil (REVATIO) 20 MG tablet Take 1-5 tablets as needed. 45 tablet 5   simvastatin (ZOCOR) 10 MG tablet TAKE 1 TABLET BY MOUTH EVERY DAY 90 tablet 3   No current facility-administered medications for this visit.     Objective:  BP (!) 154/74   Pulse 70   Temp 97.6 F (36.4 C) (Temporal)   Ht 6' 4.5" (1.943 m)   Wt 221 lb (100.2 kg)   SpO2 100%   BMI 26.55 kg/m  Gen: NAD, resting comfortably CV: RRR no murmurs rubs or gallops Lungs: CTAB no crackles, wheeze, rhonchi-other than some faint crackles in the right lung base Ext: 1+ edema but no calf pain- slightly worse on the right side Skin: warm, dry Positive Hawking, Neer, empty can test on the right with good  range of motion of shoulder    Assessment and Plan    # Hospital and rehab follow-up after DKA-patient reports substantial deconditioning but is working with therapy -Right shoulder with positive Hawkin, Neer, empty can test-discussed referral to physical therapy but since he is working with home health physical therapy already he is going to asked them to address-I am happy to sign off on this if needed  # Diabetes- history of diabetic ketoacidosis in January 2025-presented with nausea, vomiting, diarrhea and extreme weakness with fall at home with abdominal pain several days prior to admission.  IV fluid resuscitation, insulin infusion was very helpful.  Also noted to have acute kidney injury.  He also was anemic with hemoglobin from 13 down to 7.5 and required 1 unit of packed red blood cells with improvement in hemoglobin to 8.1 S: Medication: He was discharged on Lantus 45 units (now on 24 in am and 24 in pm)  and sliding scale insulin -prior Glipizide 10 mg twice daily, Metformin 1000 mg twice daily, Jardiance 10 mg CBGs-  80's to 120s in the mornings. 123 this morning. Has freestyle Josephine Igo which is helpful.  -has done diabetes education -2 at night just below 70 Exercise and diet-he has worked to drastically improve diet-reports having worked with diabetes education and he is eating much cleaner  Lab Results  Component Value Date   HGBA1C 7.1 (A) 04/25/2023   HGBA1C 10.3 (H) 03/27/2023   HGBA1C 12.2 (H) 02/13/2022  A/P: A1c was updated by my team-apparently not able to see the most recent reading but clearly already with impressive improvement -Has had a couple lows just below 70-we opted to decrease total insulin dosage to 46 units from 48 units/day-sent in for [place] which appears to be covered by his insurance -Has follow-up with endocrinology next month  # Crackles in right lower lung-after I noted this last patient if he had any cough or shortness of breath and importantly was  diagnosed with pneumonia and rehab off and x-ray and he has noted some crackling in his right lung if he lays on that side.  He was treated with azithromycin and reports some improvement in prior cough and shortness of breath -We opted to add Augmentin given his age and risk factors and have him follow-up with [place] in 1 month for x-ray after completing-follow-up sooner if new or worsening symptoms  # Acute kidney injury with creatinine up to 2.95 on admission and baseline renal function around 1.3-1.6.  Before discharge was back down to 1.3-they recommended possible nephrology consult.  This was already placed for patient - may not need ongoing care there as appears likely has CKD stage III -Update renal function  today  # Symptomatic anemia-as noted above-anemia panel was consistent with anemia of chronic disease and they recommended outpatient CBC-which we ordered today -Update iron stores as he reports taking iron and may need to stop and also check CBC but anticipate improving  #hypertension S: medication: metoprolol 100 mg 24-hour -Hydrochlorothiazide was stopped in the hospital Home readings #s: typically much better than today BP Readings from Last 3 Encounters:  04/25/23 (!) 154/74  04/02/23 131/72  01/11/23 116/78  A/P: Blood pressure slightly high today- prefer <135/85- check daily for next week and update me on mychart- may need to add another blood pressure medicine back in- stay on metoprolol alone for now   #hyperlipidemia S: Medication:Simvastatin 10 mg Lab Results  Component Value Date   CHOL 81 04/02/2023   HDL 19 (L) 04/02/2023   LDLCALC 46 04/02/2023   LDLDIRECT 70.0 02/16/2021   TRIG 81 04/02/2023   CHOLHDL 4.3 04/02/2023   A/P: Very well-controlled-continue current medication  Recommended follow up: Return in about 4 months (around 08/23/2023) for physical or sooner if needed.Schedule b4 you leave. Future Appointments  Date Time Provider Department Center  06/03/2023   3:20 PM Thapa, [place], MD [facility] None  08/05/2023  4:00 PM Bonnita Levan, RD [facility]  11/01/2023  3:00 PM Shelva Majestic, MD [facility]   Lab/Order associations:   ICD-10-CM   1. Controlled type 2 diabetes mellitus without complication, without long-term current use of insulin (HCC)  E11.9 POCT HgB A1C    Urine Microalbumin w/creat. ratio    Comprehensive metabolic panel    CBC with Differential/Platelet    2. Anemia, unspecified type  D64.9 IBC + Ferritin    3. Pneumonia of right lower lobe due to infectious organism  J18.9 DG Chest 2 View    4. Essential hypertension  I10     5. Hyperlipidemia associated with type 2 diabetes mellitus (HCC)  E11.69    E78.5     6. History of diabetic ketoacidosis  Z86.39        Time Spent: 45 minutes of total time (3:45 PM- 4:30 PM) was spent on the date of the encounter performing the following actions: chart review prior to seeing the patient including review of lab work from hospitalization and discharge summary, obtaining history, performing a medically necessary exam, counseling on the recent hospitalization and diabetes management and treatment plan, placing orders, and documenting in our EHR.    Return precautions advised.  Tana Conch, MD

## 2023-04-25 NOTE — Patient Instructions (Addendum)
Instead of 24 units twice a day of semglee (long acting insulin) you can simply take 46 units all at once - it looks like your insurance likes Evaristo Bury better which is another long acting insulin -stay off of jardiance and metformin for now  Blood pressure slightly high today- prefer <135/85- check daily for next week and update me on mychart- may need to add another blood pressure medicine back in- stay on metoprolol alone for now   Take Augmentin for 7 days- in 1 month after antiboitics come back for chest x-ray to document clearance  Please stop by lab before you go If you have mychart- we will send your results within 3 business days of [place] receiving them.  If you do not have mychart- we will call you about results within 5 business days of [place] receiving them.  *please also note that you will see labs on mychart as soon as they post. I will later go in and write notes on them- will say "notes from Dr. Durene Cal"   Recommended follow up: Return in about 4 months (around 08/23/2023) for physical or sooner if needed.Schedule b4 you leave.

## 2023-04-25 NOTE — Telephone Encounter (Signed)
[facility] faxed  Home Health Certificate (Order [place] [id number]), to be filled out by provider. Patient requested to send it back via Fax within 5-days. Document is located in providers tray at front office.Please advise at  [phone] (Fax).

## 2023-04-26 DIAGNOSIS — E871 Hypo-osmolality and hyponatremia: Secondary | ICD-10-CM | POA: Diagnosis not present

## 2023-04-26 DIAGNOSIS — M19022 Primary osteoarthritis, left elbow: Secondary | ICD-10-CM | POA: Diagnosis not present

## 2023-04-26 DIAGNOSIS — E1122 Type 2 diabetes mellitus with diabetic chronic kidney disease: Secondary | ICD-10-CM | POA: Diagnosis not present

## 2023-04-26 DIAGNOSIS — M170 Bilateral primary osteoarthritis of knee: Secondary | ICD-10-CM | POA: Diagnosis not present

## 2023-04-26 DIAGNOSIS — I129 Hypertensive chronic kidney disease with stage 1 through stage 4 chronic kidney disease, or unspecified chronic kidney disease: Secondary | ICD-10-CM | POA: Diagnosis not present

## 2023-04-26 DIAGNOSIS — D631 Anemia in chronic kidney disease: Secondary | ICD-10-CM | POA: Diagnosis not present

## 2023-04-26 DIAGNOSIS — I700 Atherosclerosis of aorta: Secondary | ICD-10-CM | POA: Diagnosis not present

## 2023-04-26 DIAGNOSIS — N1832 Chronic kidney disease, stage 3b: Secondary | ICD-10-CM | POA: Diagnosis not present

## 2023-04-26 DIAGNOSIS — E785 Hyperlipidemia, unspecified: Secondary | ICD-10-CM | POA: Diagnosis not present

## 2023-04-26 LAB — CBC WITH DIFFERENTIAL/PLATELET
Basophils Absolute: 0.1 10*3/uL (ref 0.0–0.1)
Basophils Relative: 1.4 % (ref 0.0–3.0)
Eosinophils Absolute: 0.4 10*3/uL (ref 0.0–0.7)
Eosinophils Relative: 5.7 % — ABNORMAL HIGH (ref 0.0–5.0)
HCT: 31 % — ABNORMAL LOW (ref 39.0–52.0)
Hemoglobin: 9.9 g/dL — ABNORMAL LOW (ref 13.0–17.0)
Lymphocytes Relative: 21.2 % (ref 12.0–46.0)
Lymphs Abs: 1.4 10*3/uL (ref 0.7–4.0)
MCHC: 32 g/dL (ref 30.0–36.0)
MCV: 94.3 fL (ref 78.0–100.0)
Monocytes Absolute: 0.5 10*3/uL (ref 0.1–1.0)
Monocytes Relative: 7.8 % (ref 3.0–12.0)
Neutro Abs: 4.1 10*3/uL (ref 1.4–7.7)
Neutrophils Relative %: 63.9 % (ref 43.0–77.0)
Platelets: 434 10*3/uL — ABNORMAL HIGH (ref 150.0–400.0)
RBC: 3.29 Mil/uL — ABNORMAL LOW (ref 4.22–5.81)
RDW: 16 % — ABNORMAL HIGH (ref 11.5–15.5)
WBC: 6.5 10*3/uL (ref 4.0–10.5)

## 2023-04-26 LAB — COMPREHENSIVE METABOLIC PANEL
ALT: 15 U/L (ref 0–53)
AST: 19 U/L (ref 0–37)
Albumin: 3.7 g/dL (ref 3.5–5.2)
Alkaline Phosphatase: 80 U/L (ref 39–117)
BUN: 30 mg/dL — ABNORMAL HIGH (ref 6–23)
CO2: 25 meq/L (ref 19–32)
Calcium: 9.2 mg/dL (ref 8.4–10.5)
Chloride: 105 meq/L (ref 96–112)
Creatinine, Ser: 1.19 mg/dL (ref 0.40–1.50)
GFR: 60.3 mL/min (ref >60.00)
Glucose, Bld: 90 mg/dL (ref 70–99)
Potassium: 4.9 meq/L (ref 3.5–5.1)
Sodium: 138 meq/L (ref 135–145)
Total Bilirubin: 0.4 mg/dL (ref 0.2–1.2)
Total Protein: 7.9 g/dL (ref 6.0–8.3)

## 2023-04-26 LAB — MICROALBUMIN / CREATININE URINE RATIO
Creatinine,U: 79.5 mg/dL
Microalb Creat Ratio: 8.8 mg/g (ref 0.0–30.0)
Microalb, Ur: <0.7 mg/dL (ref 0.0–1.9)

## 2023-04-26 LAB — IBC + FERRITIN
Ferritin: 173.1 ng/mL (ref 22.0–322.0)
Iron: 60 ug/dL (ref 42–165)
Saturation Ratios: 17.1 % — ABNORMAL LOW (ref 20.0–50.0)
TIBC: 350 ug/dL (ref 250.0–450.0)
Transferrin: 250 mg/dL (ref 212.0–360.0)

## 2023-04-26 NOTE — Telephone Encounter (Signed)
 Form completed and faxed back

## 2023-04-29 ENCOUNTER — Encounter: Payer: Self-pay | Admitting: Family Medicine

## 2023-05-02 ENCOUNTER — Ambulatory Visit: Payer: Medicare PPO

## 2023-05-02 DIAGNOSIS — I700 Atherosclerosis of aorta: Secondary | ICD-10-CM | POA: Diagnosis not present

## 2023-05-02 DIAGNOSIS — E1122 Type 2 diabetes mellitus with diabetic chronic kidney disease: Secondary | ICD-10-CM | POA: Diagnosis not present

## 2023-05-02 DIAGNOSIS — M19022 Primary osteoarthritis, left elbow: Secondary | ICD-10-CM | POA: Diagnosis not present

## 2023-05-02 DIAGNOSIS — D631 Anemia in chronic kidney disease: Secondary | ICD-10-CM | POA: Diagnosis not present

## 2023-05-02 DIAGNOSIS — I129 Hypertensive chronic kidney disease with stage 1 through stage 4 chronic kidney disease, or unspecified chronic kidney disease: Secondary | ICD-10-CM | POA: Diagnosis not present

## 2023-05-02 DIAGNOSIS — M170 Bilateral primary osteoarthritis of knee: Secondary | ICD-10-CM | POA: Diagnosis not present

## 2023-05-02 DIAGNOSIS — E785 Hyperlipidemia, unspecified: Secondary | ICD-10-CM | POA: Diagnosis not present

## 2023-05-02 DIAGNOSIS — N1832 Chronic kidney disease, stage 3b: Secondary | ICD-10-CM | POA: Diagnosis not present

## 2023-05-02 DIAGNOSIS — E871 Hypo-osmolality and hyponatremia: Secondary | ICD-10-CM | POA: Diagnosis not present

## 2023-05-08 ENCOUNTER — Other Ambulatory Visit: Payer: Self-pay | Admitting: Family Medicine

## 2023-05-08 DIAGNOSIS — E119 Type 2 diabetes mellitus without complications: Secondary | ICD-10-CM

## 2023-05-09 ENCOUNTER — Ambulatory Visit: Payer: Medicare PPO | Admitting: Family Medicine

## 2023-05-09 ENCOUNTER — Encounter: Payer: Self-pay | Admitting: Family Medicine

## 2023-05-09 VITALS — BP 140/72 | HR 64 | Temp 97.0°F | Ht 76.5 in | Wt 222.8 lb

## 2023-05-09 DIAGNOSIS — Z7984 Long term (current) use of oral hypoglycemic drugs: Secondary | ICD-10-CM

## 2023-05-09 DIAGNOSIS — E119 Type 2 diabetes mellitus without complications: Secondary | ICD-10-CM | POA: Diagnosis not present

## 2023-05-09 DIAGNOSIS — M26629 Arthralgia of temporomandibular joint, unspecified side: Secondary | ICD-10-CM | POA: Diagnosis not present

## 2023-05-09 DIAGNOSIS — I10 Essential (primary) hypertension: Secondary | ICD-10-CM | POA: Diagnosis not present

## 2023-05-09 NOTE — Patient Instructions (Addendum)
 blood pressure running slightly higher than we would like- we discussed starting back on hydrochlorothiazide but only half tablet at 12.5 mg and continue the metoprolol and update me in 3 weeks  Decrease the Tresiba down 44 units per days and get more consistent 3 meals a day  Recommended follow up: Return for next already scheduled visit or sooner if needed.

## 2023-05-09 NOTE — Progress Notes (Signed)
 Phone [phone] In person visit   Subjective:   Mark Birdsell Sr. is a 73 y.o. year old very pleasant male patient who presents for/with See problem oriented charting Chief Complaint  Patient presents with   right jaw pain    Pt c/o right jaw soreness with opening his mouth that started while in the hospital   Past Medical History-  Patient Active Problem List   Diagnosis Date Noted   History of diabetic ketoacidosis 03/27/2023    Priority: High   Diabetes mellitus type II, controlled (HCC) 11/25/2006    Priority: High   Acute renal failure superimposed on stage 3b chronic kidney disease (HCC) 03/30/2023    Priority: Medium    Aortic atherosclerosis (HCC) 12/11/2019    Priority: Medium    Hyperlipidemia associated with type 2 diabetes mellitus (HCC) 02/21/2017    Priority: Medium    Essential hypertension 11/25/2006    Priority: Medium    History of small bowel obstruction 01/18/2021    Priority: Low   Erectile dysfunction 09/01/2014    Priority: Low   Anemia, chronic disease 07/14/2009    Priority: Low   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   Olecranon bursitis of left elbow 07/27/2021    Priority: 1.   Bilateral primary osteoarthritis of knee 04/06/2020    Priority: 1.   Right-sided low back pain with right-sided sciatica 01/01/2020    Priority: 1.    Medications- reviewed and updated Current Outpatient Medications  Medication Sig Dispense Refill   ACCU-CHEK GUIDE test strip USE AS INSTRUCTED 100 strip 5   Accu-Chek Softclix Lancets lancets USE AS DIRECTED 100 each 5   acetaminophen (TYLENOL) 500 MG tablet Take 1,000 mg by mouth every 6 (six) hours as needed for moderate pain.     Ascorbic Acid (VITAMIN C PO) Take 1 tablet by mouth daily.     b complex vitamins capsule Take 1 capsule by mouth daily.     Blood Glucose Monitoring Suppl (ACCU-CHEK GUIDE ME) w/Device KIT Use as directed 1 kit 1   ferrous sulfate 325 (65 FE) MG tablet Take 1  tablet (325 mg total) by mouth daily with breakfast.     gabapentin (NEURONTIN) 100 MG capsule TAKE 2 CAPSULES BY MOUTH AT BEDTIME. 60 capsule 0   hydrochlorothiazide (HYDRODIURIL) 12.5 MG tablet Take 12.5 mg by mouth daily. Take half of 25 mg tablet     hydrOXYzine (ATARAX) 10 MG tablet Take 1-2 tablets (10-20 mg total) by mouth 3 (three) times daily as needed for itching. 30 tablet 0   insulin degludec (TRESIBA FLEXTOUCH) 100 UNIT/ML FlexTouch Pen Inject 46 Units into the skin daily. LONG ACTING INSULIN 15 mL 11   Insulin Pen Needle 32G X 6 MM MISC 1 each by Does not apply route daily. 100 each 11   ketoconazole (NIZORAL) 2 % cream Apply 1 Application topically daily. For 2 weeks- if not improved- can stop and let me know (Patient taking differently: Apply 1 Application topically daily as needed for irritation. For 2 weeks- if not improved- can stop and let me know) 60 g 2   metoprolol succinate (TOPROL-XL) 100 MG 24 hr tablet TAKE 1 TABLET BY MOUTH WITH OR IMMEDIATELY FOLLOWING A MEAL. 90 tablet 0   Multiple Vitamin (MULTIVITAMIN WITH MINERALS) TABS tablet Take 1 tablet by mouth daily.     Naphazoline-Pheniramine (VISINE-A OP) Place 1 drop into both eyes daily as needed (dry eyes).     Nutritional  Supplements (GLUCERNA 1.0 CAL/CARBSTEADY) LIQD Take 237 mLs by mouth in the morning and at bedtime.     sildenafil (REVATIO) 20 MG tablet Take 1-5 tablets as needed. 45 tablet 5   simvastatin (ZOCOR) 10 MG tablet TAKE 1 TABLET BY MOUTH EVERY DAY 90 tablet 3   No current facility-administered medications for this visit.     Objective:  BP (!) 140/72   Pulse 64   Temp (!) 97 F (36.1 C)   Ht 6' 4.5" (1.943 m)   Wt 222 lb 12.8 oz (101.1 kg)   SpO2 96%   BMI 26.77 kg/m  Gen: NAD, resting comfortably CV: RRR no murmurs rubs or gallops Lungs: CTAB other than very faint crackles at right lung base but improved from last visit     Assessment and Plan   # Right jaw pain S: Patient complains of  soreness in his right jaw with opening his mouth started while he was in the hospital most recently but has been persistent - things like putting partials in bother him -feels a click or pop at angle of the jaw - has not locked completely 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  With blood pressure running mildly elevated here and high acceptable at home will opt out of NSAIDs for now.  Discussed conservative treatment measures and gave handout-he is reassured  # Community-acquired pneumonia-had been treated with azithromycin in the hospital but we added Augmentin at last visit due to crackles at the right lung base -Crackles have improved but still mildly present today-he will complete chest x-ray as previously discussed   # Diabetes- history of diabetic ketoacidosis  January 2025 avoid jardiance S: Medication:Tresiba 46 units-has short acting available if needed -prior Glipizide 10 mg twice daily, Metformin 1000 mg twice daily, Jardiance 10 mg CBGs- 81-125- no further low blood sugars. Uses freestyle Josephine Igo has had 7 alerts most around 73- takes glucose tablets. If sugar is up to 250 he walks- happened twice- drops quickly with walking. Has been forgetting lunch at times- occasional consumption times vary.   Lab Results  Component Value Date   HGBA1C 7.1 (A) 04/25/2023   HGBA1C 10.3 (H) 03/27/2023   HGBA1C 12.2 (H) 02/13/2022  A/P: Diabetes has significantly improved with use of Tresiba.  On his lows notification tab on his freestyle libre there are not any events listed but it looks like he is getting the 69 and responding quickly-I would like for him to not have to get that low in the first place and we opted to decrease [place] to 44 units.  I wonder if we add metformin back in if that would be helpful but has upcoming visit with endocrinology in a few weeks so we will hold off - He is to alert me with anything under 70 on home readings  #hypertension S: medication:  metoprolol 100 mg  24-hour -Hydrochlorothiazide was stopped with hospitalization with acute kidney injury in January 2025 Home readings #s: high 130s to low 140's for most part/70s to 80's Average-  avg 139.3333 80.11111   Over 9 readings BP Readings from Last 3 Encounters:  05/09/23 (!) 140/72  04/25/23 (!) 154/74  04/02/23 131/72  A/P: blood pressure running slightly higher than we would like- we discussed starting back on hydrochlorothiazide but only half tablet at 12.5 mg and continue the metoprolol and update me in 3 weeks  Recommended follow up: Return for next already scheduled visit or sooner if needed. Future Appointments  Date Time Provider Department  Center  06/03/2023  3:20 PM Thapa, [place], MD [facility] None  08/05/2023  4:00 PM Bonnita Levan, RD [facility]  11/01/2023  3:00 PM Shelva Majestic, MD [facility]    Lab/Order associations:   ICD-10-CM   1. TMJPDS (temporomandibular joint pain dysfunction syndrome)  M26.629     2. Controlled type 2 diabetes mellitus without complication, without long-term current use of insulin (HCC)  E11.9     3. Essential hypertension  I10       No orders of the defined types were placed in this encounter.   Return precautions advised.  Tana Conch, MD

## 2023-05-19 ENCOUNTER — Other Ambulatory Visit: Payer: Self-pay | Admitting: Family Medicine

## 2023-06-03 ENCOUNTER — Other Ambulatory Visit: Payer: Self-pay | Admitting: Family Medicine

## 2023-06-03 ENCOUNTER — Ambulatory Visit: Payer: Medicare PPO | Admitting: Endocrinology

## 2023-06-08 ENCOUNTER — Other Ambulatory Visit: Payer: Self-pay | Admitting: Family Medicine

## 2023-06-10 ENCOUNTER — Other Ambulatory Visit: Payer: Self-pay | Admitting: *Deleted

## 2023-06-10 MED ORDER — HYDROCHLOROTHIAZIDE 12.5 MG PO TABS: 12.5000 mg | ORAL_TABLET | Freq: Every day | ORAL | 0 refills | Status: DC

## 2023-07-02 ENCOUNTER — Other Ambulatory Visit: Payer: Self-pay | Admitting: Family Medicine

## 2023-07-02 ENCOUNTER — Telehealth: Payer: Self-pay | Admitting: Dietician

## 2023-07-02 NOTE — Telephone Encounter (Signed)
 Returned patient call. He states that he is continuing his long acting insulin  44 units once daily and is not taking meal/correction insulin . Blood glucose is 70 at times before bed and he was asking about snacks for that time.  Discussed that he needs a protein and small portion of carbohydrate. He states that he is regaining his strength.  Patient to call for further questions.  Cydne Doyne, RD, LDN, CDCES, DipACLM

## 2023-07-06 ENCOUNTER — Other Ambulatory Visit: Payer: Self-pay | Admitting: Family Medicine

## 2023-07-16 ENCOUNTER — Ambulatory Visit: Admitting: Endocrinology

## 2023-07-23 ENCOUNTER — Encounter: Payer: Self-pay | Admitting: Family Medicine

## 2023-07-23 ENCOUNTER — Telehealth: Payer: Self-pay

## 2023-07-23 NOTE — Telephone Encounter (Signed)
 Please schedule ov with available provider to be evaluated.  Copied from CRM [id number]. Topic: Clinical - Medical Advice >> Jul 22, 2023  4:20 PM Adaysia C wrote: Reason for CRM: Patient is experiencing a runny nose and congestion and thinks he has a cold; patient has requested a call back from PCP or nurse to discuss what medications he can take or be prescribed because he takes insulin ; please follow up with patient [phone]

## 2023-07-23 NOTE — Telephone Encounter (Signed)
LVM to schedule ov with available provider

## 2023-07-24 NOTE — Telephone Encounter (Signed)
 Patient declined to schedule appointment at this time. Stated home remedies are helping symptoms.

## 2023-08-02 ENCOUNTER — Other Ambulatory Visit: Payer: Self-pay | Admitting: Podiatry

## 2023-08-03 ENCOUNTER — Other Ambulatory Visit: Payer: Self-pay | Admitting: Family Medicine

## 2023-08-05 ENCOUNTER — Telehealth: Payer: Self-pay

## 2023-08-05 ENCOUNTER — Other Ambulatory Visit: Payer: Self-pay | Admitting: Podiatry

## 2023-08-05 ENCOUNTER — Encounter: Payer: Medicare PPO | Attending: Family Medicine | Admitting: Dietician

## 2023-08-05 ENCOUNTER — Encounter: Payer: Self-pay | Admitting: Dietician

## 2023-08-05 DIAGNOSIS — E119 Type 2 diabetes mellitus without complications: Secondary | ICD-10-CM | POA: Diagnosis not present

## 2023-08-05 MED ORDER — GENTAMICIN SULFATE 0.1 % EX CREA: 1.0000 | TOPICAL_CREAM | Freq: Two times a day (BID) | CUTANEOUS | 2 refills | Status: DC

## 2023-08-05 NOTE — Telephone Encounter (Signed)
 Patient called and left a message - states that his refill request for gentamicin  cream was denied. He was last seen May 2024 - -from last note :  Patient was evaluated.  -Stressed the importance of working closely with his PCP to better manage and control his diabetes -Patient may discontinue postsurgical shoe.  Recommend good supportive tennis shoes and sneakers.  Advised against going barefoot -Return to clinic annually   Patient insists that you were to fill this cream whenever he asked for it, so that he would always have it on hand - no mention of that in your note. He would like for you to call him  Dr. Spero Dye [phone]

## 2023-08-05 NOTE — Patient Instructions (Addendum)
 Great job! Continue to take your medication as prescribed. Continue to stay active  - >150 minutes moderate activity per week.  Snack ideas: Carrots, hummus 1/2 sandwich Low carb tortilla, hummus, loose greens Nuts and fruit Peanut butter and apple

## 2023-08-05 NOTE — Progress Notes (Unsigned)
 Diabetes Self-Management Education  Visit Type: Follow-up  Appt. Start Time: 1605 Appt. End Time: 1645  08/09/2023  [name], identified by name and date of birth, is a 73 y.o. male with a diagnosis of Diabetes:  .   ASSESSMENT Patient is here today alone.  He was last seen by this RD on 04/18/2023.  Going to the gym every other day and rides his bike in the neighborhood. He states that the diabetes has given him a new perspective.  He is checking on his coligues more often as friends have been passing away more frequently. Stopped drinking regular soda. Fasting blood glucose 85-125 and 80-130 before bed.   Referral:  Type 2 Diabetes Patient was recently hospitalized due to a fall.  He was also found to be in DKA at that time.     History:  Type 2 DM (2022), hyperlipidemia , hypertension Medications/Supplements include: Tresiba  44 units daily, Novolog  per sliding scale (not taking), Vitamin B12 , Iron, garlic, mens daily MVI, Vitamin C Labs noted to include: A1C - 7.1% on 04/25/2023, 10.3% 03/27/23 and 12.2% 02/13/2022 , GFR - 60 on 04/25/2023 CGM:  freestyle libre 3+ but prefers to use a finger stick with a blood glucose meter   CGM Results from download: 04/18/23  % Time CGM active:   98 %   (Goal >70%)  Average glucose:   147 mg/dL for 14 days  Glucose management indicator:   6.6 %  Time in range (70-180 mg/dL):   78 %   (Goal >16%)  Time High (181-250 mg/dL):   20 %   (Goal < 10%)  Time Very High (>250 mg/dL):    2 %   (Goal < 5%)  Time Low (54-69 mg/dL):   0 %   (Goal <9%)  Time Very Low (<54 mg/dL):   0 %   (Goal <6%)  %CV (glucose variability)    0 %  (Goal <36%)    Weight hx: 64.5" 229 lbs 08/06/23 219 lbs  04/19/2023 207 lbs 01/22/2023 280 lbs UBW for years    Patient lives with his wife  Both shop for groceries , she cooks.   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 Walks stairs in his home or walking to the mail box to increase activity gradually.  He was more active prior to his recent hospitalization.   Diabetes Self-Management Education - 08/09/23 1833       Visit Information   Visit Type Follow-up      Psychosocial Assessment   Patient Belief/Attitude about Diabetes Motivated to manage diabetes    What is the hardest part about your diabetes right now, causing you the most concern, or is the most worrisome to you about your diabetes?   Making healty food and beverage choices    Self-management support Doctor's office;CDE visits    Other persons present Patient    Patient Concerns Nutrition/Meal planning;Glycemic Control    Special Needs None    Preferred Learning Style No preference indicated    Learning Readiness Ready    How often do you need to have someone help you when you read instructions, pamphlets, or other written materials from your doctor or pharmacy? 1 - Never      Pre-Education Assessment   Patient understands the diabetes disease and treatment process. Needs Review    Patient understands incorporating nutritional management into lifestyle. Needs  Review    Patient undertands incorporating physical activity into lifestyle. Needs Review    Patient understands using medications safely. Needs Review    Patient understands monitoring blood glucose, interpreting and using results Needs Review    Patient understands prevention, detection, and treatment of acute complications. Needs Review    Patient understands prevention, detection, and treatment of chronic complications. Needs Review    Patient understands how to develop strategies to address psychosocial issues. Needs Review    Patient understands how to develop strategies to promote health/change behavior. Needs Review      Complications   Last HgB A1C per patient/outside source 7.1 %   04/25/2023   How often do you check your blood sugar? 1-2 times/day    Fasting  Blood glucose range (mg/dL) 16-109    Postprandial Blood glucose range (mg/dL) 60-454    Number of hypoglycemic episodes per month 0      Dietary Intake   Breakfast Oatmeal with cinnamon and small amount of honey or grits, sausage or bacon, Premier protein shake    Snack (morning) Nuts    Lunch Baked Chicken, fried fish, whole grain bread (1)    Snack (afternoon) Fresh fruit or ritz crackers and PB    Dinner Baked chicken, cabbage, mac and cheese    Snack (evening) Apple with PB, hummus    Beverage(s) Water, zero gingerale or lemonade, herbal tea      Activity / Exercise   Activity / Exercise Type Moderate (swimming / aerobic walking)    How many days per week do you exercise? 7    How many minutes per day do you exercise? 45    Total minutes per week of exercise 315      Patient Education   Previous Diabetes Education Yes (please comment)   04/2023   Healthy Eating Meal options for control of blood glucose level and chronic complications.;Plate Method    Being Active Helped patient identify appropriate exercises in relation to his/her diabetes, diabetes complications and other health issue.    Medications Reviewed patients medication for diabetes, action, purpose, timing of dose and side effects.    Monitoring Taught/evaluated SMBG meter.;Identified appropriate SMBG and/or A1C goals.    Acute complications Taught prevention, symptoms, and  treatment of hypoglycemia - the 15 rule.    Chronic complications Relationship between chronic complications and blood glucose control    Diabetes Stress and Support Identified and addressed patients feelings and concerns about diabetes;Worked with patient to identify barriers to care and solutions      Individualized Goals (developed by patient)   Nutrition General guidelines for healthy choices and portions discussed    Physical Activity Exercise 5-7 days per week    Medications take my medication as prescribed    Monitoring  Test my blood  glucose as discussed    Problem Solving Eating Pattern    Reducing Risk examine blood glucose patterns;do foot checks daily;treat hypoglycemia with 15 grams of carbs if blood glucose less than 70mg /dL      Patient Self-Evaluation of Goals - Patient rates self as meeting previously set goals (% of time)   Nutrition 50 - 75 % (half of the time)    Physical Activity >75% (most of the time)    Medications >75% (most of the time)    Monitoring >75% (most of the time)    Problem Solving and behavior change strategies  >75% (most of the time)    Reducing Risk (treating acute and chronic  complications) >75% (most of the time)    Health Coping >75% (most of the time)      Post-Education Assessment   Patient understands the diabetes disease and treatment process. Demonstrates understanding / competency    Patient understands incorporating nutritional management into lifestyle. Comprehends key points    Patient undertands incorporating physical activity into lifestyle. Demonstrates understanding / competency    Patient understands using medications safely. Demonstrates understanding / competency    Patient understands monitoring blood glucose, interpreting and using results Demonstrates understanding / competency    Patient understands prevention, detection, and treatment of acute complications. Demonstrates understanding / competency    Patient understands prevention, detection, and treatment of chronic complications. Demonstrates understanding / competency    Patient understands how to develop strategies to address psychosocial issues. Demonstrates understanding / competency    Patient understands how to develop strategies to promote health/change behavior. Comprehends key points      Outcomes   Expected Outcomes Demonstrated interest in learning. Expect positive outcomes    Future DMSE PRN    Program Status Completed      Subsequent Visit   Since your last visit have you continued or begun to  take your medications as prescribed? Yes    Since your last visit have you experienced any weight changes? Gain    Weight Gain (lbs) 10             Individualized Plan for Diabetes Self-Management Training:   Learning Objective:  Patient will have a greater understanding of diabetes self-management. Patient education plan is to attend individual and/or group sessions per assessed needs and concerns.   Plan:   Patient Instructions  Suella Emmer job! Continue to take your medication as prescribed. Continue to stay active  - >150 minutes moderate activity per week.  Snack ideas: Carrots, hummus 1/2 sandwich Low carb tortilla, hummus, loose greens Nuts and fruit Peanut butter and apple   Expected Outcomes:  Demonstrated interest in learning. Expect positive outcomes  Education material provided: My Plate  If problems or questions, patient to contact team via:  Phone  Future DSME appointment: PRN

## 2023-08-27 ENCOUNTER — Telehealth: Payer: Self-pay | Admitting: Podiatry

## 2023-08-27 ENCOUNTER — Other Ambulatory Visit: Payer: Self-pay | Admitting: Podiatry

## 2023-08-27 MED ORDER — GENTAMICIN SULFATE 0.1 % EX CREA: 1.0000 | TOPICAL_CREAM | Freq: Two times a day (BID) | CUTANEOUS | 2 refills | Status: AC

## 2023-08-27 MED ORDER — DOXYCYCLINE HYCLATE 100 MG PO TABS: 100.0000 mg | ORAL_TABLET | Freq: Two times a day (BID) | ORAL | 0 refills | Status: DC

## 2023-08-27 NOTE — Telephone Encounter (Signed)
 Patient needs tablets that were called in previously called in again.  Foot is fine, but toe is causing friction and he doesn't want it to get worse.

## 2023-08-30 ENCOUNTER — Other Ambulatory Visit: Payer: Self-pay | Admitting: Family Medicine

## 2023-09-04 ENCOUNTER — Ambulatory Visit (INDEPENDENT_AMBULATORY_CARE_PROVIDER_SITE_OTHER)

## 2023-09-04 ENCOUNTER — Encounter: Payer: Self-pay | Admitting: Podiatry

## 2023-09-04 ENCOUNTER — Ambulatory Visit: Admitting: Podiatry

## 2023-09-04 VITALS — Ht 76.5 in | Wt 222.8 lb

## 2023-09-04 DIAGNOSIS — M2012 Hallux valgus (acquired), left foot: Secondary | ICD-10-CM | POA: Diagnosis not present

## 2023-09-04 DIAGNOSIS — L97522 Non-pressure chronic ulcer of other part of left foot with fat layer exposed: Secondary | ICD-10-CM

## 2023-09-04 DIAGNOSIS — M2042 Other hammer toe(s) (acquired), left foot: Secondary | ICD-10-CM

## 2023-09-04 NOTE — Progress Notes (Signed)
No chief complaint on file.   Subjective:  73 y.o. male with PMHx of diabetes mellitus (last A1c 04/25/2023 was 7.1) presenting today for follow-up evaluation of a wound that developed to the left second toe.  Doxycycline  and gentamicin  cream was prescribed to the patient 08/27/2023.  He has noticed significant improvement since that time   Past Medical History:  Diagnosis Date   Diabetes type 2, controlled (HCC)    Hyperlipidemia    Hypertension    UNSPECIFIED TACHYCARDIA 05/06/2007   Listed 05/06/2007- do not see recurrent tachycardia since that time     Past Surgical History:  Procedure Laterality Date   CYSTOSCOPY WITH BIOPSY N/A 03/25/2020   Procedure: CYSTOSCOPY WITH PROSTATE UERTHRAL  BIOPSY/ TRANSURETHRAL RESECTION OF PROSTATE/ BLADDER BIOPSY/ BILATERAL RETROGRADE/ URETEROSCOPY;  Surgeon: [name], MD;  Location: [facility];  Service: Urology;  Laterality: N/A;   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  Dermatology:  Currently the ulcer overlying the PIPJ of the second toe appears very stable.  Dry.  No drainage.  No indication of acute infection  Vascular: Palpable pedal pulses bilaterally.  No edema or erythema noted.  Capillary refill WNL.  Clinically no concern for vascular compromise  Neurological: Light touch and protective threshold diminished bilaterally.   Musculoskeletal Exam: Hallux valgus deformity noted to the left foot with overlapping hammertoe of the second digit  Radiographic exam LT foot 09/04/2023: Stable.  Normal osseous mineralization.  Moderate degenerative changes noted.  Hallux valgus deformity with hammertoe contracture of the second digit also noted.  Prominent head of the proximal phalanx contributing to the overlying ulcer development.  There is no  clear indication of osseous erosions or cortical destruction that would be concerning at the moment for osteomyelitis  Assessment: 1.  Ulcer left second toe 2. diabetes mellitus w/ peripheral neuropathy 3.  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  X-rays reviewed - Continue working closely with his PCP to better manage and control his diabetes - Today we discussed long-term management regarding the patient's chronic wound and overlapping hammertoe deformity.  Surgical options were discussed including correcting for the bunion and hammertoe versus isolated second toe amputation.  My recommendation is an amputation of the second toe.  Opting for second toe amputation should reduce potential complications postoperatively since there is no hardware implanted and less invasive than bunionectomy and hammertoe repair surgery.  Risks and benefits of each modality were discussed.  Postoperative recovery course for each modality was also discussed.  For now the patient would like to think about surgery options -In the meantime continue wearing good supportive tennis shoes and sneakers that do not irritate the foot -Return to clinic PRN  Thresa EMERSON Sar, DPM [facility]  Dr. Thresa EMERSON Sar, DPM    [address], [place] [address]                Office [phone]  Fax (  [phone]   

## 2023-09-05 ENCOUNTER — Other Ambulatory Visit: Payer: Self-pay | Admitting: Family Medicine

## 2023-09-17 NOTE — Telephone Encounter (Signed)
 Returned pt call and pt will bring forms by tomorrrow.  Copied from CRM [id number]. Topic: General - Call Back - No Documentation >> Sep 17, 2023 11:14 AM Carlyon D wrote: Reason for CRM: Pt would like a call back from Dr.Hunters assistant Durelle Zepeda.

## 2023-09-17 NOTE — Telephone Encounter (Signed)
 Called and spoke with pt and made aware that we have not received any forms.  Copied from CRM [id number]. Topic: General - Other >> Sep 16, 2023  4:14 PM Chiquita SQUIBB wrote: Reason for CRM: Patient is calling to see if Dr. Katrinka has received the fax for the medical information needed for a claim the patient is filling from [employer]. Please advise the patient.

## 2023-09-19 ENCOUNTER — Telehealth: Payer: Self-pay

## 2023-09-19 NOTE — Telephone Encounter (Signed)
 Noted, called and lm for pt tcb.  Copied from CRM [id number]. Topic: General - Other >> Sep 19, 2023 12:24 PM Aisha D wrote: Reason for CRM: Pt is calling to verify if Raeanne and Dr.Hunter received the fruit and books he brought to the office. I informed the pt that they did receive them. Pt is requesting that Yannet Rincon give him a call once she is available.

## 2023-09-23 ENCOUNTER — Ambulatory Visit: Admitting: Podiatry

## 2023-10-16 ENCOUNTER — Ambulatory Visit: Admitting: Endocrinology

## 2023-10-17 ENCOUNTER — Other Ambulatory Visit: Payer: Self-pay | Admitting: Family Medicine

## 2023-10-17 DIAGNOSIS — L299 Pruritus, unspecified: Secondary | ICD-10-CM

## 2023-10-17 DIAGNOSIS — T63481A Toxic effect of venom of other arthropod, accidental (unintentional), initial encounter: Secondary | ICD-10-CM

## 2023-10-31 ENCOUNTER — Other Ambulatory Visit: Payer: Self-pay | Admitting: Family Medicine

## 2023-11-01 ENCOUNTER — Encounter: Payer: Self-pay | Admitting: Family Medicine

## 2023-11-01 ENCOUNTER — Ambulatory Visit: Payer: Medicare PPO | Admitting: Family Medicine

## 2023-11-01 VITALS — BP 136/72 | HR 58 | Temp 99.5°F | Ht 76.5 in | Wt 237.4 lb

## 2023-11-01 DIAGNOSIS — I10 Essential (primary) hypertension: Secondary | ICD-10-CM

## 2023-11-01 DIAGNOSIS — E785 Hyperlipidemia, unspecified: Secondary | ICD-10-CM | POA: Diagnosis not present

## 2023-11-01 DIAGNOSIS — Z0000 Encounter for general adult medical examination without abnormal findings: Secondary | ICD-10-CM | POA: Diagnosis not present

## 2023-11-01 DIAGNOSIS — E119 Type 2 diabetes mellitus without complications: Secondary | ICD-10-CM | POA: Diagnosis not present

## 2023-11-01 DIAGNOSIS — E1169 Type 2 diabetes mellitus with other specified complication: Secondary | ICD-10-CM

## 2023-11-01 DIAGNOSIS — Z6828 Body mass index (BMI) 28.0-28.9, adult: Secondary | ICD-10-CM | POA: Diagnosis not present

## 2023-11-01 DIAGNOSIS — R351 Nocturia: Secondary | ICD-10-CM | POA: Diagnosis not present

## 2023-11-01 DIAGNOSIS — Z1211 Encounter for screening for malignant neoplasm of colon: Secondary | ICD-10-CM

## 2023-11-01 MED ORDER — METOPROLOL SUCCINATE ER 100 MG PO TB24: 100.0000 mg | ORAL_TABLET | Freq: Every day | ORAL | 3 refills | Status: AC

## 2023-11-01 MED ORDER — SILDENAFIL CITRATE 100 MG PO TABS: 50.0000 mg | ORAL_TABLET | Freq: Every day | ORAL | 11 refills | Status: AC | PRN

## 2023-11-01 NOTE — Progress Notes (Signed)
 Phone: [phone]   Subjective:  Patient presents today for their annual physical. Chief complaint-noted.   See problem oriented charting- ROS- full  review of systems was completed and negative  except for topics noted under acute/chronic concerns  The following were reviewed and entered/updated in epic: Past Medical History:  Diagnosis Date   Diabetes type 2, controlled (HCC)    Hyperlipidemia    Hypertension    UNSPECIFIED TACHYCARDIA 05/06/2007   Listed 05/06/2007- do not see recurrent tachycardia since that time    Patient Active Problem List   Diagnosis Date Noted   History of diabetic ketoacidosis 03/27/2023    Priority: High   Diabetes mellitus type II, controlled (HCC) 11/25/2006    Priority: High   Acute renal failure superimposed on stage 3b chronic kidney disease (HCC) 03/30/2023    Priority: Medium    Aortic atherosclerosis (HCC) 12/11/2019    Priority: Medium    Hyperlipidemia associated with type 2 diabetes mellitus (HCC) 02/21/2017    Priority: Medium    Essential hypertension 11/25/2006    Priority: Medium    History of small bowel obstruction 01/18/2021    Priority: Low   Erectile dysfunction 09/01/2014    Priority: Low   Anemia, chronic disease 07/14/2009    Priority: Low   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   Olecranon bursitis of left elbow 07/27/2021    Priority: 1.   Bilateral primary osteoarthritis of knee 04/06/2020    Priority: 1.   Right-sided low back pain with right-sided sciatica 01/01/2020    Priority: 1.   Past Surgical History:  Procedure Laterality Date   CYSTOSCOPY WITH BIOPSY N/A 03/25/2020   Procedure: CYSTOSCOPY WITH PROSTATE UERTHRAL  BIOPSY/ TRANSURETHRAL RESECTION OF PROSTATE/ BLADDER BIOPSY/ BILATERAL RETROGRADE/ URETEROSCOPY;  Surgeon: [name], MD;  Location: [facility];  Service: Urology;  Laterality: N/A;   left leg surgery      due to schrapnel    MULTIPLE TOOTH EXTRACTIONS      Family History   Problem Relation Age of Onset   Diabetes Other    Kidney disease Other    Other Mother        respiratory failure died in 62s   Alcohol abuse Father    Lung disease Father        smoking   Heart disease Sister        heart attack 4   Alcohol abuse Brother    Cirrhosis Brother    Kidney disease Sister    COPD Sister        led to death. procedural complication   Diabetes Brother     Medications- reviewed and updated Current Outpatient Medications  Medication Sig Dispense Refill   ACCU-CHEK GUIDE test strip USE AS INSTRUCTED 100 strip 5   Accu-Chek Softclix Lancets lancets USE AS DIRECTED 100 each 5   acetaminophen  (TYLENOL ) 500 MG tablet Take 1,000 mg by mouth every 6 (six) hours as needed for moderate pain.     Ascorbic Acid (VITAMIN C PO) Take 1 tablet by mouth daily.     b complex vitamins capsule Take 1 capsule by mouth daily.     Blood Glucose Monitoring Suppl (ACCU-CHEK GUIDE ME) w/Device KIT Use as directed 1 kit 1   ferrous sulfate  325 (65 FE) MG tablet Take 1 tablet (325 mg total) by mouth daily with breakfast.     gabapentin  (NEURONTIN ) 100 MG capsule TAKE 2 CAPSULES BY MOUTH AT BEDTIME 180 capsule  1   gentamicin  cream (GARAMYCIN ) 0.1 % Apply 1 Application topically 2 (two) times daily. 30 g 2   hydrochlorothiazide  (HYDRODIURIL ) 12.5 MG tablet TAKE 1 TABLET (12.5 MG TOTAL) BY MOUTH DAILY. TAKE HALF OF 25 MG TABLET 90 tablet 1   hydrOXYzine  (ATARAX ) 10 MG tablet TAKE 1 TO 2 TABLETS BY MOUTH 3 TIMES A DAY AS NEEDED FOR ITCHING 30 tablet 0   insulin  degludec (TRESIBA  FLEXTOUCH) 100 UNIT/ML FlexTouch Pen Inject 46 Units into the skin daily. LONG ACTING INSULIN  15 mL 11   Insulin  Pen Needle 32G X 6 MM MISC 1 each by Does not apply route daily. 100 each 11   ketoconazole  (NIZORAL ) 2 % cream Apply 1 Application topically daily. For 2 weeks- if not improved- can stop and let me know 60 g 2   Multiple Vitamin (MULTIVITAMIN WITH MINERALS) TABS tablet Take 1 tablet by mouth daily.      Naphazoline-Pheniramine (VISINE-A OP) Place 1 drop into both eyes daily as needed (dry eyes).     Nutritional Supplements (GLUCERNA 1.0 CAL/CARBSTEADY) LIQD Take 237 mLs by mouth in the morning and at bedtime.     sildenafil  (VIAGRA ) 100 MG tablet Take 0.5-1 tablets (50-100 mg total) by mouth daily as needed for erectile dysfunction. 10 tablet 11   simvastatin  (ZOCOR ) 10 MG tablet TAKE 1 TABLET BY MOUTH EVERY DAY 90 tablet 3   metoprolol  succinate (TOPROL -XL) 100 MG 24 hr tablet Take 1 tablet (100 mg total) by mouth daily. 90 tablet 3   No current facility-administered medications for this visit.    Allergies-reviewed and updated No Known Allergies  Social History   Social History Narrative   Married 3 children. Wife and son patient here. 2 grandkids.       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    [employer] under[employer]   1978 [employer]- 4 degrees through 2000- [employer], math. 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    Objective  Objective:  BP 136/72   Pulse (!) 58   Temp 99.5 F (37.5 C) (Temporal)   Ht 6' 4.5 (1.943 m)   Wt 237 lb 6.4 oz (107.7 kg)   SpO2 97%   BMI 28.52 kg/m  Gen: NAD, resting comfortably HEENT: Mucous membranes are moist. Oropharynx normal Neck: no thyromegaly CV: RRR no murmurs rubs or gallops Lungs: CTAB no crackles, wheeze, rhonchi Abdomen: soft/nontender/nondistended/normal bowel sounds. No rebound or guarding.  Ext: no edema Skin: warm, dry Neuro: grossly normal, moves all  extremities, PERRLA  Diabetic foot exam was performed with the following findings:   No deformities, ulcerations, or other skin breakdown Normal sensation of 10g monofilament Intact posterior tibialis and dorsalis pedis pulses Hammertoe left foot- prior ulcer healed. Callous under 1st and 4th metatarsophalangeal joint bilateral        Assessment and Plan  73 y.o. male presenting for annual physical.  Health Maintenance counseling: 1. Anticipatory guidance: Patient counseled regarding regular dental exams -q6 months, eye exams -yearly,  avoiding smoking and second hand smoke , limiting alcohol to 2 beverages per day -  never drinkier, no illicit drugs .   2. Risk factor reduction:  Advised patient of need for regular exercise and diet rich and fruits and vegetables to reduce risk of heart attack and stroke.  Exercise- walking or riding bike daily. No chest pain or shortness of breath with those acitvities Diet/weight management-up 15 lbs since hospitalization but a lot of that was prior weight loss and appetite finally coming back.  Wt Readings from Last 3 Encounters:  11/01/23 237 lb 6.4 oz (107.7 kg)  09/04/23 222 lb 12.8 oz (101.1 kg)  05/09/23 222 lb 12.8 oz (101.1 kg)  3. Immunizations/screenings/ancillary studies-advised fall flu shot and consider COVID shot.  Overdue for Tetanus, Diphtheria, and Pertussis (Tdap)- recommend at pharmacy  Immunization History  Administered Date(s) Administered   Fluad Quad(high Dose 65+) 10/31/2018, 11/05/2019, 11/23/2021   INFLUENZA, HIGH DOSE SEASONAL PF 02/21/2017, 01/08/2018, 11/17/2020, 11/13/2022   Influenza Split 03/05/2011   Influenza Whole 03/05/2005   Influenza,inj,Quad PF,6+ Mos 04/29/2014   Moderna Sars-Covid-2 Vaccination 04/16/2019, 05/14/2019, 12/10/2019, 08/10/2020   PNEUMOCOCCAL CONJUGATE-20 09/20/2022   Pfizer Covid-19 Vaccine Bivalent Booster 73yrs & up 11/17/2020   Pfizer(Comirnaty)Fall Seasonal Vaccine 12 years and older  11/13/2022   Pneumococcal Conjugate-13 09/01/2014   Pneumococcal Polysaccharide-23 03/05/2006, 01/04/2015, 02/02/2016   Respiratory Syncytial Virus Vaccine,Recomb Aduvanted(Arexvy) 02/14/2022   Td 03/06/1999   Tdap 03/05/2011, 03/02/2013   Unspecified SARS-COV-2 Vaccination 11/23/2021   Zoster Recombinant(Shingrix) 10/01/2020, 08/30/2021   Zoster, Live 09/03/2014  4. Prostate cancer screening- he prefers to continue to monitor  at least through 73- low risk trend Lab Results  Component Value Date   PSA 0.33 02/13/2022   PSA 0.54 02/16/2021   PSA 0.4 10/15/2019   5. Colon cancer screening - prefers cologuard- has never had colonoscopy 6. Skin cancer screening- lower risk due to melanin content. advised regular sunscreen use. Denies worrisome, changing, or new skin lesions.  7. Smoking associated screening (lung cancer screening, AAA screen 65-75, UA)- never smoker 8. STD screening - only active with wife  Status of chronic or acute concerns   #Toe ulcer/hammertoe second digit left- has healed- diabetes foot exam next time as recent podiatry visit  # Diabetes- history of diabetic ketoacidosis  January 2025 avoid jardiance  S: Medication: Tresiba  44 units-has short acting available if needed -prior Glipizide  10 mg twice daily, Metformin  1000 mg twice daily, Jardiance  10 mg CBGs- freestyle libre- does not like. Glucometer. Most readings between 80-120 and takes twice a day Lab Results  Component Value Date   HGBA1C 7.1 (A) 04/25/2023   HGBA1C 10.3 (H) 03/27/2023   HGBA1C 12.2 (H) 02/13/2022  A/P: hopefully stable- update a1c today. Continue current meds for now   #hypertension S: medication:  metoprolol  100 mg 24-hour, hydrochlorothiazide  25 mg -Hydrochlorothiazide  was stopped with hospitalization with acute kidney injury in January 2025 but later restarted BP Readings from Last 3 Encounters:  11/01/23 136/72  05/09/23 (!) 140/72  04/25/23 (!) 154/74  A/P: high acceptable-  continue current medications   #hyperlipidemia S: Medication:Simvastatin  10 mg  Lab Results  Component Value Date   CHOL 81 04/02/2023   HDL 19 (L) 04/02/2023   LDLCALC 46 04/02/2023   LDLDIRECT 70.0 02/16/2021   TRIG 81 04/02/2023   CHOLHDL 4.3 04/02/2023  A/P: hopefully stable- update lipid panel today. Continue current meds for now   # Gabapentin  per Dr. Claudene - but we can renew if needed- right low back pain  # erectile dysfunction-uses sildenafil  as needed   Recommended follow up:  Return in about 6 months (around 05/02/2024) for followup or sooner if needed.Schedule b4 you leave. Future Appointments  Date Time Provider Department Center  11/19/2023 10:00 AM Thapa, [place], MD [facility] None   Lab/Order associations: breakfast- sausage croissant-  and salad for lunch   ICD-10-CM   1. Preventative health care  Z00.00     2. Controlled type 2 diabetes mellitus without complication, unspecified whether long term insulin  use (HCC)  E11.9 Hemoglobin A1c    Microalbumin / creatinine urine ratio    3. Essential hypertension  I10 Urinalysis, Routine w reflex microscopic    4. Hyperlipidemia associated with type 2 diabetes mellitus (HCC)  E11.69 Comprehensive metabolic panel with GFR   E78.5 CBC with Differential/Platelet    Lipid panel    TSH    5. Nocturia  R35.1 PSA    6. Screen for colon cancer  Z12.11 Cologuard     Meds ordered this encounter  Medications   metoprolol  succinate (TOPROL -XL) 100 MG 24 hr tablet    Sig: Take 1 tablet (100 mg total) by mouth daily.    Dispense:  90 tablet    Refill:  3   sildenafil  (VIAGRA ) 100 MG tablet    Sig: Take 0.5-1 tablets (50-100 mg total) by mouth daily as needed for erectile dysfunction.    Dispense:  10 tablet    Refill:  11   Return precautions advised.  Garnette Lukes, MD

## 2023-11-01 NOTE — Patient Instructions (Addendum)
 Recommend Tetanus, Diphtheria, and Pertussis (Tdap) at pharmacy. Also let me know when you get your flu shot  please let us  know if you have not received your Cologuard within 3 weeks-please complete after you receive OR if you change mind and want to do colonoscopy  Please stop by lab before you go If you have mychart- we will send your results within 3 business days of us  receiving them.  If you do not have mychart- we will call you about results within 5 business days of us  receiving them.  *please also note that you will see labs on mychart as soon as they post. I will later go in and write notes on them- will say notes from Dr. Katrinka   Recommended follow up: Return in about 6 months (around 05/02/2024) for followup or sooner if needed.Schedule b4 you leave.

## 2023-11-02 ENCOUNTER — Ambulatory Visit: Payer: Self-pay | Admitting: Family Medicine

## 2023-11-02 LAB — MICROALBUMIN / CREATININE URINE RATIO
Creatinine, Urine: 98 mg/dL (ref 20–320)
Microalb, Ur: <0.2 mg/dL

## 2023-11-02 LAB — PSA: PSA: 0.46 ng/mL (ref <=4.00)

## 2023-11-02 LAB — LIPID PANEL
Cholesterol: 137 mg/dL (ref <200)
HDL: 44 mg/dL (ref >=40)
LDL Cholesterol (Calc): 76 mg/dL
Non-HDL Cholesterol (Calc): 93 mg/dL (ref <130)
Total CHOL/HDL Ratio: 3.1 (calc) (ref <5.0)
Triglycerides: 86 mg/dL (ref <150)

## 2023-11-02 LAB — COMPREHENSIVE METABOLIC PANEL WITH GFR
AG Ratio: 1.3 (calc) (ref 1.0–2.5)
ALT: 18 U/L (ref 9–46)
AST: 26 U/L (ref 10–35)
Albumin: 4 g/dL (ref 3.6–5.1)
Alkaline phosphatase (APISO): 76 U/L (ref 35–144)
BUN/Creatinine Ratio: 24 (calc) — ABNORMAL HIGH (ref 6–22)
BUN: 34 mg/dL — ABNORMAL HIGH (ref 7–25)
CO2: 26 mmol/L (ref 20–32)
Calcium: 9.4 mg/dL (ref 8.6–10.3)
Chloride: 105 mmol/L (ref 98–110)
Creat: 1.4 mg/dL — ABNORMAL HIGH (ref 0.70–1.28)
Globulin: 3 g/dL (ref 1.9–3.7)
Glucose, Bld: 72 mg/dL (ref 65–99)
Potassium: 4.9 mmol/L (ref 3.5–5.3)
Sodium: 138 mmol/L (ref 135–146)
Total Bilirubin: 0.3 mg/dL (ref 0.2–1.2)
Total Protein: 7 g/dL (ref 6.1–8.1)
eGFR: 53 mL/min/1.73m2 — ABNORMAL LOW (ref >=60)

## 2023-11-02 LAB — CBC WITH DIFFERENTIAL/PLATELET
Absolute Lymphocytes: 1492 {cells}/uL (ref 850–3900)
Absolute Monocytes: 493 {cells}/uL (ref 200–950)
Basophils Absolute: 48 {cells}/uL (ref 0–200)
Basophils Relative: 1.1 %
Eosinophils Absolute: 119 {cells}/uL (ref 15–500)
Eosinophils Relative: 2.7 %
HCT: 37.3 % — ABNORMAL LOW (ref 38.5–50.0)
Hemoglobin: 12.1 g/dL — ABNORMAL LOW (ref 13.2–17.1)
MCH: 29.9 pg (ref 27.0–33.0)
MCHC: 32.4 g/dL (ref 32.0–36.0)
MCV: 92.1 fL (ref 80.0–100.0)
MPV: 10 fL (ref 7.5–12.5)
Monocytes Relative: 11.2 %
Neutro Abs: 2248 {cells}/uL (ref 1500–7800)
Neutrophils Relative %: 51.1 %
Platelets: 260 Thousand/uL (ref 140–400)
RBC: 4.05 Million/uL — ABNORMAL LOW (ref 4.20–5.80)
RDW: 13.7 % (ref 11.0–15.0)
Total Lymphocyte: 33.9 %
WBC: 4.4 Thousand/uL (ref 3.8–10.8)

## 2023-11-02 LAB — TSH: TSH: 1.05 m[IU]/L (ref 0.40–4.50)

## 2023-11-02 LAB — URINALYSIS, ROUTINE W REFLEX MICROSCOPIC
Bilirubin Urine: NEGATIVE
Glucose, UA: NEGATIVE
Hgb urine dipstick: NEGATIVE
Ketones, ur: NEGATIVE
Leukocytes,Ua: NEGATIVE
Nitrite: NEGATIVE
Protein, ur: NEGATIVE
Specific Gravity, Urine: 1.017 (ref 1.001–1.035)
pH: 6.5 (ref 5.0–8.0)

## 2023-11-02 LAB — HEMOGLOBIN A1C
Hgb A1c MFr Bld: 6.6 % — ABNORMAL HIGH (ref <5.7)
Mean Plasma Glucose: 143 mg/dL
eAG (mmol/L): 7.9 mmol/L

## 2023-11-05 ENCOUNTER — Other Ambulatory Visit: Payer: Self-pay

## 2023-11-05 MED ORDER — SIMVASTATIN 10 MG PO TABS
20.0000 mg | ORAL_TABLET | Freq: Every day | ORAL | 3 refills | Status: DC
Start: 2023-11-05 — End: 2023-12-12

## 2023-11-05 NOTE — Telephone Encounter (Signed)
 Sinvastatin increased to 20mg  and sent.

## 2023-11-14 ENCOUNTER — Telehealth: Payer: Self-pay | Admitting: Family Medicine

## 2023-11-14 NOTE — Telephone Encounter (Signed)
 Medication list printed and placed in front office for patient to pick up. Patient aware.    Copied from CRM #[id number]. Topic: General - Inquiry >> Nov 14, 2023  1:52 PM [place] M wrote: Reason for CRM: patient is needing documentation that he currently takes hydrochlorothiazide  (HYDRODIURIL ) 12.5 MG tablet   metoprolol  succinate (TOPROL -XL) 100 MG 24 hr tablet... he is going to see the [facility] on monday afternoon, needing to get documentation that he has high blood pressure and hypertension to take to the [facility]. Please contact patient as soon as possible.

## 2023-11-15 ENCOUNTER — Telehealth: Payer: Self-pay | Admitting: Family Medicine

## 2023-11-15 NOTE — Telephone Encounter (Addendum)
 Final disposition: Advised Home Care   Patient Name First: Mark Ali Last: Mayeux Gender: Male DOB: 07/12/1950 Age: 73 Y 9 M 1 D Return Phone Number: [phone] (Primary) Address: City/ State/ Zip: [place]  [address] Client [facility] - Human resources officer [facility] [employer] Provider Katrinka Senior- MD Contact Type Call Who Is Calling Patient / Member / Family / Caregiver Call Type Triage / Clinical Relationship To Patient Self Return Phone Number [phone] (Primary) Chief Complaint Cold Symptom Reason for Call Symptomatic / Request for Health Information Initial Comment Caller states he is diabetic and has a cold. Has a runny nose would like to know what he should do. Translation No Nurse Assessment Nurse: Jackson, RN, Angie Date/Time (Eastern Time): 11/13/2023 6:33:53 PM Confirm and document reason for call. If symptomatic, describe symptoms. ---Caller states he has runny nose,cough cold x2 days. hx of diabetics. Wants to know if can purchase anything otc Does the patient have any new or worsening symptoms? ---Yes Will a triage be completed? ---Yes Related visit to physician within the last 2 weeks? ---No Does the PT have any chronic conditions? (i.e. diabetes, asthma, this includes High risk factors for pregnancy, etc.) ---Yes List chronic conditions. ---diabetes Is this a behavioral health or substance abuse call? ---No Guidelines Guideline Title Affirmed Question Affirmed Notes Nurse Date/Time Titus Time) Common Cold Common cold with no complications Jackson PEAK, Angie 11/13/2023 6:36:15 PM Disp. Time Titus Time) Disposition Final User 11/13/2023 6:26:06 PM Send to Clinical Louis Hope, RN, Donzell 11/13/2023 6:47:21 PM Home Care Yes Jackson, RN, Angie Final Disposition 11/13/2023 6:47:21 PM Home Care Yes Jackson, RN, Angie Caller Disagree/Comply Comply Caller Understands Yes PreDisposition  Home Care Care Advice Given Per Guideline HOME CARE: * You should be able to treat this at home. * It sounds like an uncomplicated cold that you can treat at home. * Here is some care advice that should help. * Nasal mucus and discharge help wash viruses and bacteria out of the nose and sinuses. * Blowing your nose helps clean out your nose. Use a handkerchief or a paper tissue. NASAL WASHES FOR A STUFFY NOSE: * Introduction: Saline (salt water) nasal irrigation (nasal wash) is an effective and simple home remedy for treating stuffy nose and sinus congestion. The nose can be irrigated by pouring, spraying, or squirting salt water into the nose and then letting it run back out. MEDICINES FOR STUFFY OR RUNNY NOSE: * Antihistamines are only helpful if you also have nasal allergies. * Pseudoephedrine (Sudafed): Available over-the-counter in pill form. Typical adult dosage is two 30 mg tablets every 6 hours. * Do not use these medicines if you have high blood pressure, heart disease, prostate problems, or an overactive thyroid . * Cough: Use cough drops. * Hydrate: Drink adequate liquids. * Runny nose usually lasts 7 to 10 days. CALL BACK IF: * Runny nose lasts over 10 days * You become short of breath * You become worse CARE ADVICE given per Common Cold (Adult) guideline

## 2023-11-19 ENCOUNTER — Ambulatory Visit: Admitting: Endocrinology

## 2023-11-21 ENCOUNTER — Telehealth: Payer: Self-pay | Admitting: Family Medicine

## 2023-11-21 NOTE — Telephone Encounter (Signed)
 Returned patients phone call and left a vm with callback number.    Copied from CRM [id number]. Topic: General - Other >> Nov 21, 2023  3:02 PM Thersia BROCKS wrote: Reason for CRM: Patient called in regarding a message to [place] , would like a callback at [phone]

## 2023-12-06 ENCOUNTER — Encounter: Payer: Self-pay | Admitting: Family Medicine

## 2023-12-12 ENCOUNTER — Ambulatory Visit

## 2023-12-12 MED ORDER — SIMVASTATIN 20 MG PO TABS: 20.0000 mg | ORAL_TABLET | Freq: Every day | ORAL | 3 refills | Status: AC

## 2023-12-17 ENCOUNTER — Ambulatory Visit: Admitting: Endocrinology

## 2023-12-18 ENCOUNTER — Other Ambulatory Visit: Payer: Self-pay | Admitting: Family Medicine

## 2024-01-14 ENCOUNTER — Ambulatory Visit

## 2024-01-14 VITALS — Ht 76.5 in | Wt 237.0 lb

## 2024-01-14 DIAGNOSIS — Z0000 Encounter for general adult medical examination without abnormal findings: Secondary | ICD-10-CM | POA: Diagnosis not present

## 2024-01-14 DIAGNOSIS — Z1211 Encounter for screening for malignant neoplasm of colon: Secondary | ICD-10-CM

## 2024-01-14 NOTE — Progress Notes (Signed)
 Subjective:   Mark Dallas Mura Sr. is a 73 y.o. male who presents for a Medicare Annual Wellness Visit.  Allergies (verified) Patient has no known allergies.   History: Past Medical History:  Diagnosis Date   Diabetes type 2, controlled (HCC)    Hyperlipidemia    Hypertension    UNSPECIFIED TACHYCARDIA 05/06/2007   Listed 05/06/2007- do not see recurrent tachycardia since that time    Past Surgical History:  Procedure Laterality Date   CYSTOSCOPY WITH BIOPSY N/A 03/25/2020   Procedure: CYSTOSCOPY WITH PROSTATE UERTHRAL  BIOPSY/ TRANSURETHRAL RESECTION OF PROSTATE/ BLADDER BIOPSY/ BILATERAL RETROGRADE/ URETEROSCOPY;  Surgeon: [name], MD;  Location: [facility];  Service: Urology;  Laterality: N/A;   left leg surgery      due to schrapnel    MULTIPLE TOOTH EXTRACTIONS     Family History  Problem Relation Age of Onset   Diabetes Other    Kidney disease Other    Other Mother        respiratory failure died in 30s   Alcohol abuse Father    Lung disease Father        smoking   Heart disease Sister        heart attack 44   Alcohol abuse Brother    Cirrhosis Brother    Kidney disease Sister    COPD Sister        led to death. procedural complication   Diabetes Brother    Social History   Occupational History   Not on file  Tobacco Use   Smoking status: Never   Smokeless tobacco: Never  Vaping Use   Vaping status: Never Used  Substance and Sexual Activity   Alcohol use: No   Drug use: No   Sexual activity: Yes   Tobacco Counseling Counseling given: Not Answered  SDOH Screenings   Food Insecurity: No Food Insecurity (01/14/2024)  Housing: Unknown (01/14/2024)  Transportation Needs: No Transportation Needs (01/14/2024)  Utilities: Not At Risk (01/14/2024)  Depression (PHQ2-9): Low Risk  (01/14/2024)  Financial Resource Strain: Low Risk  (01/22/2023)  Physical Activity: Unknown (01/14/2024)  Social Connections: Socially Integrated (01/14/2024)  Stress:  No Stress Concern Present (01/14/2024)  Tobacco Use: Low Risk  (11/01/2023)  Health Literacy: Adequate Health Literacy (01/14/2024)   Depression Screen    01/14/2024    3:02 PM 11/01/2023    3:05 PM 04/25/2023    3:12 PM 01/22/2023    2:18 PM 10/01/2022    2:03 PM 12/25/2021   10:18 AM 11/02/2021    2:05 PM  PHQ 2/9 Scores  PHQ - 2 Score 0 0 0 0 0 0 0  PHQ- 9 Score     0  0  0      Data saved with a previous flowsheet row definition     Goals Addressed   None    Visit info / Clinical Intake: Medicare Wellness Visit Type:: Subsequent Annual Wellness Visit Persons participating in visit:: patient Medicare Wellness Visit Mode:: Telephone If telephone:: video declined Because this visit was a virtual/telehealth visit:: unable to obtan vitals due to lack of equipment If Telephone or Video please confirm:: I connected with the patient using audio enabled telemedicine application and verified that I am speaking with the correct person using two identifiers Patient Location:: home Provider Location:: office Information given by:: patient Interpreter Needed?: No Pre-visit prep was completed: yes AWV questionnaire completed by patient prior to visit?: no Living arrangements:: lives with spouse/significant other Patient's Overall  Health Status Rating: very good Typical amount of pain: none Does pain affect daily life?: no Are you currently prescribed opioids?: no  Dietary Habits and Nutritional Risks How many meals a day?: 3 Eats fruit and vegetables daily?: yes Most meals are obtained by: preparing own meals; eating out Diabetic:: (!) yes (94 before breakfast) Any non-healing wounds?: no How often do you check your BS?: 3 Would you like to be referred to a Nutritionist or for Diabetic Management? : no  Functional Status Activities of Daily Living (to include ambulation/medication): Independent Ambulation: Independent with device- listed below Home Assistive Devices/Equipment:  Eyeglasses Medication Administration: Independent Home Management: Independent Manage your own finances?: yes Primary transportation is: driving Concerns about vision?: no *vision screening is required for WTM* Concerns about hearing?: no  Fall Screening Falls in the past year?: 0 Number of falls in past year: 0 Was there an injury with Fall?: 0 Fall Risk Category Calculator: 0 Patient Fall Risk Level: Low Fall Risk  Fall Risk Patient at Risk for Falls Due to: No Fall Risks Fall risk Follow up: Falls prevention discussed  Home and Transportation Safety: All rugs have non-skid backing?: N/A, no rugs All stairs or steps have railings?: yes Grab bars in the bathtub or shower?: (!) no Have non-skid surface in bathtub or shower?: yes Good home lighting?: yes Regular seat belt use?: yes Hospital stays in the last year:: (!) yes How many hospital stays:: 2 Reason: [facility] with ketoacidosis, pnemonioa and then rehab  Cognitive Assessment Difficulty concentrating, remembering, or making decisions? : no Will 6CIT or Mini Cog be Completed: no 6CIT or Mini Cog Declined: patient alert, oriented, able to answer questions appropriately and recall recent events What year is it?: 0 points What month is it?: 0 points Give patient an address phrase to remember (5 components): [address] [place] About what time is it?: 0 points Count backwards from 20 to 1: 0 points Say the months of the year in reverse: 0 points Repeat the address phrase from earlier: 0 points 6 CIT Score: 0 points  Advance Directives (For Healthcare) Does Patient Have a Medical Advance Directive?: Yes Does patient want to make changes to medical advance directive?: No - Patient declined Type of Advance Directive: Healthcare Power of Attorney Copy of Healthcare Power of Attorney in Chart?: No - copy requested Copy of Living Will in Chart?: No - copy requested  Reviewed/Updated  Reviewed/Updated: Reviewed All  (Medical, Surgical, Family, Medications, Allergies, Care Teams, Patient Goals)        Objective:    Today's Vitals   01/14/24 1453  Weight: 237 lb (107.5 kg)  Height: 6' 4.5 (1.943 m)   Body mass index is 28.47 kg/m.  Current Medications (verified) Outpatient Encounter Medications as of 01/14/2024  Medication Sig   ACCU-CHEK GUIDE test strip USE AS INSTRUCTED   Accu-Chek Softclix Lancets lancets USE AS DIRECTED   acetaminophen  (TYLENOL ) 500 MG tablet Take 1,000 mg by mouth every 6 (six) hours as needed for moderate pain.   Ascorbic Acid (VITAMIN C PO) Take 1 tablet by mouth daily.   b complex vitamins capsule Take 1 capsule by mouth daily.   Blood Glucose Monitoring Suppl (ACCU-CHEK GUIDE ME) w/Device KIT Use as directed   COMIRNATY syringe    ferrous sulfate  325 (65 FE) MG tablet Take 1 tablet (325 mg total) by mouth daily with breakfast.   FLUAD 0.5 ML injection    gabapentin  (NEURONTIN ) 100 MG capsule TAKE 2 CAPSULES BY  MOUTH AT BEDTIME   gentamicin  cream (GARAMYCIN ) 0.1 % Apply 1 Application topically 2 (two) times daily.   hydrochlorothiazide  (HYDRODIURIL ) 12.5 MG tablet TAKE 1 TABLET (12.5 MG TOTAL) BY MOUTH DAILY. TAKE HALF OF 25 MG TABLET   hydrOXYzine  (ATARAX ) 10 MG tablet TAKE 1 TO 2 TABLETS BY MOUTH 3 TIMES A DAY AS NEEDED FOR ITCHING   insulin  degludec (TRESIBA  FLEXTOUCH) 100 UNIT/ML FlexTouch Pen Inject 46 Units into the skin daily. LONG ACTING INSULIN    Insulin  Pen Needle 32G X 6 MM MISC 1 each by Does not apply route daily.   ketoconazole  (NIZORAL ) 2 % cream Apply 1 Application topically daily. For 2 weeks- if not improved- can stop and let me know   metoprolol  succinate (TOPROL -XL) 100 MG 24 hr tablet Take 1 tablet (100 mg total) by mouth daily.   Multiple Vitamin (MULTIVITAMIN WITH MINERALS) TABS tablet Take 1 tablet by mouth daily.   Naphazoline-Pheniramine (VISINE-A OP) Place 1 drop into both eyes daily as needed (dry eyes).   Nutritional Supplements (GLUCERNA  1.0 CAL/CARBSTEADY) LIQD Take 237 mLs by mouth in the morning and at bedtime.   sildenafil  (VIAGRA ) 100 MG tablet Take 0.5-1 tablets (50-100 mg total) by mouth daily as needed for erectile dysfunction.   simvastatin  (ZOCOR ) 20 MG tablet Take 1 tablet (20 mg total) by mouth daily.   No facility-administered encounter medications on file as of 01/14/2024.   Hearing/Vision screen Hearing Screening - Comments:: Pt denies any hearing issues  Vision Screening - Comments:: Wears rx glasses - up to date with routine eye exams with [facility]  Immunizations and Health Maintenance Health Maintenance  Topic Date Due   Colonoscopy  Never done   OPHTHALMOLOGY EXAM  02/21/2024 (Originally 10/17/2023)   HEMOGLOBIN A1C  05/02/2024   COVID-19 Vaccine (9 - 2025-26 season) 06/17/2024   Diabetic kidney evaluation - eGFR measurement  10/31/2024   Diabetic kidney evaluation - Urine ACR  10/31/2024   FOOT EXAM  10/31/2024   Medicare Annual Wellness (AWV)  01/13/2025   DTaP/Tdap/Td (5 - Td or Tdap) 11/04/2033   Pneumococcal Vaccine: 50+ Years  Completed   Influenza Vaccine  Completed   Hepatitis C Screening  Completed   Zoster Vaccines- Shingrix  Completed   Meningococcal B Vaccine  Aged Out        Assessment/Plan:  This is a routine wellness examination for [place].  Patient Care Team: Katrinka Garnette KIDD, MD as PCP - General (Family Medicine)  I have personally reviewed and noted the following in the patient's chart:   Medical and social history Use of alcohol, tobacco or illicit drugs  Current medications and supplements including opioid prescriptions. Functional ability and status Nutritional status Physical activity Advanced directives List of other physicians Hospitalizations, surgeries, and ER visits in previous 12 months Vitals Screenings to include cognitive, depression, and falls Referrals and appointments  Orders Placed This Encounter  Procedures   Ambulatory referral to  gastroenterology for colonoscopy    Referral Priority:   Routine    Referral Type:   Consultation    Referral Reason:   Specialty Services Required    Number of Visits Requested:   1   In addition, I have reviewed and discussed with patient certain preventive protocols, quality metrics, and best practice recommendations. A written personalized care plan for preventive services as well as general preventive health recommendations were provided to patient.   Ellouise VEAR Haws, LPN   88/88/7974   Return in 1 year (on  01/13/2025).  After Visit Summary: (MyChart) Due to this being a telephonic visit, the after visit summary with patients personalized plan was offered to patient via MyChart   Nurse Notes: nothing significant at this time

## 2024-01-14 NOTE — Patient Instructions (Signed)
 [name],  Thank you for taking the time for your Medicare Wellness Visit. I appreciate your continued commitment to your health goals. Please review the care plan we discussed, and feel free to reach out if I can assist you further.  Please note that Annual Wellness Visits do not include a physical exam. Some assessments may be limited, especially if the visit was conducted virtually. If needed, we may recommend an in-person follow-up with your provider.  Ongoing Care Seeing your primary care provider every 3 to 6 months helps us  monitor your health and provide consistent, personalized care.   Referrals If a referral was made during today's visit and you haven't received any updates within two weeks, please contact the referred provider directly to check on the status.  Recommended Screenings:  Health Maintenance  Topic Date Due   Colon Cancer Screening  Never done   Medicare Annual Wellness Visit  01/22/2024   Eye exam for diabetics  02/21/2024*   Hemoglobin A1C  05/02/2024   COVID-19 Vaccine (9 - 2025-26 season) 06/17/2024   Yearly kidney function blood test for diabetes  10/31/2024   Yearly kidney health urinalysis for diabetes  10/31/2024   Complete foot exam   10/31/2024   DTaP/Tdap/Td vaccine (5 - Td or Tdap) 11/04/2033   Pneumococcal Vaccine for age over 60  Completed   Flu Shot  Completed   Hepatitis C Screening  Completed   Zoster (Shingles) Vaccine  Completed   Meningitis B Vaccine  Aged Out  *Topic was postponed. The date shown is not the original due date.       03/27/2023    4:45 PM  Advanced Directives  Does Patient Have a Medical Advance Directive? Yes  Type of Advance Directive Living will  Does patient want to make changes to medical advance directive? No - Patient declined    Vision: Annual vision screenings are recommended for early detection of glaucoma, cataracts, and diabetic retinopathy. These exams can also reveal signs of chronic conditions such as  diabetes and high blood pressure.  Dental: Annual dental screenings help detect early signs of oral cancer, gum disease, and other conditions linked to overall health, including heart disease and diabetes.  Please see the attached documents for additional preventive care recommendations.

## 2024-01-27 ENCOUNTER — Ambulatory Visit (INDEPENDENT_AMBULATORY_CARE_PROVIDER_SITE_OTHER)

## 2024-01-27 ENCOUNTER — Ambulatory Visit: Admitting: Family Medicine

## 2024-01-27 VITALS — BP 140/70 | HR 62 | Ht 76.5 in | Wt 242.0 lb

## 2024-01-27 DIAGNOSIS — M4316 Spondylolisthesis, lumbar region: Secondary | ICD-10-CM | POA: Diagnosis not present

## 2024-01-27 DIAGNOSIS — G8929 Other chronic pain: Secondary | ICD-10-CM | POA: Diagnosis not present

## 2024-01-27 DIAGNOSIS — M5441 Lumbago with sciatica, right side: Secondary | ICD-10-CM | POA: Diagnosis not present

## 2024-01-27 DIAGNOSIS — M5442 Lumbago with sciatica, left side: Secondary | ICD-10-CM

## 2024-01-27 DIAGNOSIS — M545 Low back pain, unspecified: Secondary | ICD-10-CM | POA: Diagnosis not present

## 2024-01-27 DIAGNOSIS — M48061 Spinal stenosis, lumbar region without neurogenic claudication: Secondary | ICD-10-CM | POA: Diagnosis not present

## 2024-01-27 DIAGNOSIS — M47816 Spondylosis without myelopathy or radiculopathy, lumbar region: Secondary | ICD-10-CM | POA: Diagnosis not present

## 2024-01-27 DIAGNOSIS — M5136 Other intervertebral disc degeneration, lumbar region with discogenic back pain only: Secondary | ICD-10-CM | POA: Diagnosis not present

## 2024-01-27 NOTE — Progress Notes (Unsigned)
   LILLETTE Mark Ali am a scribe for Dr. Artist Lloyd, MD.  Carlin [facility] Sr. is a 73 y.o. male who presents to [facility] at [facility] today for LBP. Pt was previously seen by Dr. Claudene on 01/11/23 for bilat knee and L elbow pain.  Today, pt c/o LBP x for years. Pt locates pain to lower back. Pain comes and goes. Elbow is doing better. Would like to talk about his knees as well.   Radiating pain: yes down leg LE numbness/tingling: yes tingling LE weakness: some Aggravates: no (will sit down and rest) Treatments tried: tylenol , gapabentin  Pertinent review of systems: No fevers or chills  Relevant historical information: Diabetes chronic kidney disease   Exam:  BP (!) 140/70   Pulse 62   Ht 6' 4.5 (1.943 m)   Wt 242 lb (109.8 kg)   SpO2 97%   BMI 29.07 kg/m  General: Well Developed, well nourished, and in no acute distress.   MSK: L-spine: Normal-appearing Nontender palpation spinal midline. Decreased lumbar motion. Lower extremity strength is intact.    Lab and Radiology Results  X-ray images lumbar spine obtained today personally and independently interpreted. 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  Anterolisthesis L4-5.  No acute fractures.  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 Await formal radiology review    Assessment and Plan: 73 y.o. male with chronic low back pain with recent exacerbation.  Patient does have degenerative changes lumbar spine.  Plan for physical therapy referral.  Recheck in 2 months unless better.   PDMP not reviewed this encounter. Orders Placed This Encounter  Procedures   DG Lumbar Spine 2-3 Views    Standing Status:   Future    Number of Occurrences:   1    Expiration Date:   02/26/2024    Reason for Exam (SYMPTOM  OR DIAGNOSIS REQUIRED):   low back pain    Preferred imaging location?:   [facility]   Ambulatory referral to Physical Therapy    Referral Priority:   Routine    Referral Type:   Physical Medicine    Referral  Reason:   Specialty Services Required    Requested Specialty:   Physical Therapy    Number of Visits Requested:   1   No orders of the defined types were placed in this encounter.    Discussed warning signs or symptoms. Please see discharge instructions. Patient expresses understanding.   The above documentation has been reviewed and is accurate and complete Artist Lloyd, M.D.

## 2024-01-27 NOTE — Patient Instructions (Addendum)
 Thank you for coming in today.   Please get an Xray today before you leave   I've referred you to Physical Therapy.  Let us know if you don't hear from them in one week.   Check back in 2 months

## 2024-02-02 ENCOUNTER — Encounter: Payer: Self-pay | Admitting: Family Medicine

## 2024-02-03 ENCOUNTER — Ambulatory Visit: Payer: Self-pay | Admitting: Family Medicine

## 2024-02-03 ENCOUNTER — Encounter: Payer: Self-pay | Admitting: Family Medicine

## 2024-02-03 NOTE — Telephone Encounter (Signed)
 Forwarding to Dr. Denyse Amass to review and advise.

## 2024-02-03 NOTE — Progress Notes (Signed)
 Low back x-ray shows some arthritis changes.  No broken bones.

## 2024-02-04 NOTE — Telephone Encounter (Signed)
 Forwarding to Dr. Alease Hunter to prescribe.

## 2024-02-05 MED ORDER — TIZANIDINE HCL 2 MG PO TABS: 2.0000 mg | ORAL_TABLET | Freq: Three times a day (TID) | ORAL | 1 refills | Status: DC | PRN

## 2024-02-07 ENCOUNTER — Telehealth: Payer: Self-pay | Admitting: Family Medicine

## 2024-02-07 NOTE — Telephone Encounter (Signed)
 [employer] faxed disability form, to be filled out by provider. Patient requested to send it back via Fax within ASAP. Document is located in providers tray at front office.Please advise at [phone].

## 2024-02-11 NOTE — Telephone Encounter (Signed)
 What dates was he out of work? Id assume 03/26/23 first date but when was he able to return?

## 2024-02-12 NOTE — Telephone Encounter (Signed)
 So I am somewhat confused. The paperwork is asking when did I take patient out of work but if he was not working then I did not actually take him out of work. Perhaps we could say from the hospitalization date until the march follow up of 2025 when he had improved?

## 2024-02-12 NOTE — Telephone Encounter (Signed)
 I have completed my portion and given back to [facility]

## 2024-02-12 NOTE — Telephone Encounter (Signed)
Faxed and copy sent to scan.

## 2024-02-13 ENCOUNTER — Other Ambulatory Visit: Payer: Self-pay | Admitting: Family Medicine

## 2024-02-13 NOTE — Telephone Encounter (Signed)
 Last OV 01/27/24 Next OV not scheduled   Request for 90 day supply.

## 2024-02-18 ENCOUNTER — Telehealth: Payer: Self-pay | Admitting: Family Medicine

## 2024-02-18 NOTE — Telephone Encounter (Signed)
 Refaxed. 02/18/2024 1100  Copied from CRM #[id number]. Topic: General - Other >> Feb 18, 2024 10:56 AM Aleatha BROCKS wrote: Reason for CRM: [employer] calling for verification on a form that was received completed by Dr Katrinka, time period,disability supported for clarification callback# [phone] Fax [phone]

## 2024-02-25 ENCOUNTER — Telehealth: Payer: Self-pay | Admitting: Family Medicine

## 2024-02-25 NOTE — Telephone Encounter (Signed)
 [employer] faxed disability forms, to be filled out by provider. Patient requested to send it back via Fax within ASAP. Document is located in providers tray at front office.Please advise at [phone].

## 2024-02-25 NOTE — Telephone Encounter (Signed)
 We have not received any further paperwork from his disability provider since what we faxed 02/12/2024.  Copied from CRM [id number]. Topic: General - Other >> Feb 25, 2024 10:46 AM Mark Ali DEL wrote: Reason for CRM: Patient called in stating that disability is needing additional information from provider. Patient stated that disability office said that they have contacted provider as well in regards to this matter.

## 2024-02-25 NOTE — Telephone Encounter (Signed)
 Mark Ali is following on this call for more information. She has not received the requested info as of yet and wanted to make sure we are processing this for the patient. Disability dates are required and was left open ended. Will send a new fax if provider wants to fill out a new form or provider can return call to clarify this info at [phone]. Fax back number [phone]

## 2024-02-26 NOTE — Telephone Encounter (Signed)
 Requested information is on the form and patient has been notified of when the form should be completed. Tks

## 2024-02-26 NOTE — Telephone Encounter (Signed)
 Paperwork recevied and placed in provider box. Message sent to provider.

## 2024-02-26 NOTE — Telephone Encounter (Signed)
 Disability form placed in provider box for review and signature.

## 2024-03-01 ENCOUNTER — Other Ambulatory Visit: Payer: Self-pay | Admitting: Family Medicine

## 2024-03-02 ENCOUNTER — Other Ambulatory Visit: Payer: Self-pay

## 2024-03-02 MED ORDER — GABAPENTIN 100 MG PO CAPS: 200.0000 mg | ORAL_CAPSULE | Freq: Every day | ORAL | 0 refills | Status: AC

## 2024-03-12 ENCOUNTER — Ambulatory Visit: Admitting: Physical Therapy

## 2024-03-13 ENCOUNTER — Encounter: Payer: Self-pay | Admitting: Family Medicine

## 2024-03-13 ENCOUNTER — Other Ambulatory Visit: Payer: Self-pay | Admitting: Family Medicine

## 2024-03-13 DIAGNOSIS — E119 Type 2 diabetes mellitus without complications: Secondary | ICD-10-CM

## 2024-03-13 NOTE — Telephone Encounter (Signed)
 I just looked back at the last paperwork we sent and it stated 05/09/2023. Anything I need to do differently or are we leaving it the same.

## 2024-03-26 ENCOUNTER — Ambulatory Visit: Admitting: Physical Therapy

## 2024-04-23 ENCOUNTER — Ambulatory Visit: Admitting: Physical Therapy

## 2025-01-25 ENCOUNTER — Ambulatory Visit
